# Patient Record
Sex: Female | Born: 1990 | Race: Black or African American | Hispanic: No | Marital: Single | Smoking: Never smoker
Health system: Southern US, Community
[De-identification: ages and names within clinical notes are randomized; demographics above are authoritative.]

## PROBLEM LIST (undated history)

## (undated) ENCOUNTER — Ambulatory Visit (HOSPITAL_COMMUNITY): Payer: MEDICAID | Source: Home / Self Care

## (undated) DIAGNOSIS — G43909 Migraine, unspecified, not intractable, without status migrainosus: Secondary | ICD-10-CM

## (undated) DIAGNOSIS — A6000 Herpesviral infection of urogenital system, unspecified: Secondary | ICD-10-CM

## (undated) DIAGNOSIS — B379 Candidiasis, unspecified: Secondary | ICD-10-CM

## (undated) DIAGNOSIS — A599 Trichomoniasis, unspecified: Secondary | ICD-10-CM

---

## 2013-01-20 ENCOUNTER — Encounter (HOSPITAL_COMMUNITY): Payer: Self-pay | Admitting: *Deleted

## 2013-01-20 ENCOUNTER — Emergency Department (HOSPITAL_COMMUNITY)
Admission: EM | Admit: 2013-01-20 | Discharge: 2013-01-20 | Disposition: A | Discharge status: Home / Self Care | Payer: Medicaid - Out of State | Attending: Emergency Medicine | Admitting: Emergency Medicine

## 2013-01-20 DIAGNOSIS — Z3202 Encounter for pregnancy test, result negative: Secondary | ICD-10-CM | POA: Insufficient documentation

## 2013-01-20 DIAGNOSIS — N390 Urinary tract infection, site not specified: Secondary | ICD-10-CM

## 2013-01-20 LAB — URINALYSIS, ROUTINE W REFLEX MICROSCOPIC
Glucose, UA: NEGATIVE mg/dL
Protein, ur: NEGATIVE mg/dL
Specific Gravity, Urine: 1.034 — ABNORMAL HIGH (ref 1.005–1.030)
pH: 6.5 (ref 5.0–8.0)

## 2013-01-20 LAB — URINE MICROSCOPIC-ADD ON

## 2013-01-20 LAB — POCT PREGNANCY, URINE: Preg Test, Ur: NEGATIVE

## 2013-01-20 MED ORDER — SULFAMETHOXAZOLE-TRIMETHOPRIM 800-160 MG PO TABS: 1.0000 | ORAL_TABLET | Freq: Two times a day (BID) | ORAL | Status: DC

## 2013-01-20 NOTE — ED Provider Notes (Signed)
History     CSN: [id number]  Arrival date & time 01/20/13  0720   First MD Initiated Contact with Patient 01/20/13 [phone]      Chief Complaint  Patient presents with  . Dysuria    (Consider location/radiation/quality/duration/timing/severity/associated sxs/prior treatment) HPI  22 year old female presents complaining of dysuria. Patient reports for the past 3 days she has had pain or pressure upon urination, urinary frequency and urgency and also notice hematuria on the first onset of the symptoms. Symptom has been gradual, persistent, worsening. No specific treatment tried. No complaints of fever, chills, nausea, vomiting, diarrhea, vaginal discharge, or rash. Denies abdominal pain or back pain. Denies pain with sexual activity. Last menstrual period was 2 weeks ago. Patient is sexually active and not using protection. No prior history of UTI.  No past medical history on file.  No past surgical history on file.  No family history on file.  History  Substance Use Topics  . Smoking status: Not on file  . Smokeless tobacco: Not on file  . Alcohol Use: Not on file    OB History   No data available      Review of Systems  Constitutional:       10 Systems reviewed and all are negative for acute change except as noted in the HPI.     Allergies  Review of patient's allergies indicates not on file.  Home Medications  No current outpatient prescriptions on file.  BP 118/62  Pulse 84  Temp(Src) 98.8 F (37.1 C) (Oral)  Resp 16  SpO2 98%  Physical Exam  Nursing note and vitals reviewed. Constitutional: She appears well-developed and well-nourished. No distress.  Awake, alert, nontoxic appearance  HENT:  Head: Atraumatic.  Eyes: Conjunctivae are normal. Right eye exhibits no discharge. Left eye exhibits no discharge.  Neck: Neck supple.  Cardiovascular: Normal rate and regular rhythm.   Pulmonary/Chest: Effort normal. No respiratory distress. She exhibits no  tenderness.  Abdominal: Soft. There is no tenderness. There is no rebound.  Genitourinary:  No CVA tenderness  Musculoskeletal: She exhibits no tenderness.  Neurological: She is alert.  Skin: No rash noted.  Psychiatric: She has a normal mood and affect.    ED Course  Procedures (including critical care time)  7:43 AM Patient with dysuria suggestive of urinary tract infection. UA and pregnancy test obtained. No abdominal pain back pain on exam. Is afebrile with stable normal vital sign.  8:36 AM UA shows some evidence of UTI, since pt exhibits sxs suggest of UTI, will treat with bactrim.  Urine culture sent.    Labs Reviewed  URINALYSIS, ROUTINE W REFLEX MICROSCOPIC - Abnormal; Notable for the following:    APPearance HAZY (*)    Specific Gravity, Urine 1.034 (*)    Leukocytes, UA SMALL (*)    All other components within normal limits  URINE MICROSCOPIC-ADD ON - Abnormal; Notable for the following:    Bacteria, UA FEW (*)    All other components within normal limits  POCT PREGNANCY, URINE   No results found.   1. UTI (lower urinary tract infection)       MDM  BP 118/62  Pulse 84  Temp(Src) 98.8 F (37.1 C) (Oral)  Resp 16  SpO2 98%         Fayrene Helper, PA-C 01/20/13 [phone]

## 2013-01-20 NOTE — ED Notes (Signed)
Pt in c/o painful urination post voiding onset x 3 days, pt reports hematuria on first onset of symptoms, pt denies vaginal discharge, painful intercourse, urinary frequency, pt A&O x 4, follows commands, speaks in complete sentences

## 2013-01-21 LAB — URINE CULTURE: Colony Count: >=100000

## 2013-01-21 NOTE — ED Provider Notes (Signed)
Medical screening examination/treatment/procedure(s) were performed by non-physician practitioner and as supervising physician I was immediately available for consultation/collaboration.   Carleene Cooper III, MD 01/21/13 [phone]

## 2013-01-22 NOTE — Progress Notes (Signed)
ED Antimicrobial Stewardship Positive Culture Follow Up   Jeanne Chaney is an 22 y.o. female who presented to [facility] on 01/20/2013 with a chief complaint of painful urination, hematuria, painful intercourse, and urinary frequency  Chief Complaint  Patient presents with  . Dysuria    Recent Results (from the past 720 hour(s))  URINE CULTURE     Status: None   Collection Time    01/20/13  7:35 AM      Result Value Range Status   Specimen Description URINE, CLEAN CATCH   Final   Special Requests NONE   Final   Culture  Setup Time 01/20/2013 06:33   Final   Colony Count >=100,000 COLONIES/ML   Final   Culture     Final   Value: STAPHYLOCOCCUS SPECIES (COAGULASE NEGATIVE)     Note: RIFAMPIN AND GENTAMICIN SHOULD NOT BE USED AS SINGLE DRUGS FOR TREATMENT OF STAPH INFECTIONS.   Report Status 01/21/2013 FINAL   Final   Organism ID, Bacteria STAPHYLOCOCCUS SPECIES (COAGULASE NEGATIVE)   Final    [x]  Treated with Bactrim, organism not tested against prescribed antimicrobial []  Patient discharged originally without antimicrobial agent and treatment is now indicated  There is a chance that Bactrim will cover the patient's UTI -- however this was not tested for. Would recommend to call the patient: * If feeling better -- no changes needed, can complete treatment course with Bactrim * If still symptomatic -- would recommend changing to Macrobid 100 mg twice daily for 7 days  ED Provider: Wynetta Emery, PA-C  Rolley Sims 01/22/2013, 10:50 AM Infectious Diseases Pharmacist Phone# [phone]

## 2013-01-22 NOTE — ED Notes (Signed)
Post ED Visit - Positive Culture Follow-up: Successful Patient Follow-Up  Culture assessed and recommendations reviewed by: []  Wes Dulaney, Pharm.D., BCPS []  Celedonio Miyamoto, Pharm.D., BCPS [x]  Georgina Pillion, Pharm.D., BCPS []  [place], [address].D., BCPS, AAHIVP []  Estella Husk, Pharm.D., BCPS, AAHIVP  Positive urine culture  []  Patient discharged without antimicrobial prescription and treatment is now indicated [x]  Organism is resistant to prescribed ED discharge antimicrobial []  Patient with positive blood cultures  Changes discussed with ED provider:Nicole Piscotta New antibiotic prescription recommend change to  Macrobid 100 mg BID x 7 days needs to be called to pharmacy.  Contacted patient-Voice mail message for patient to return call , date 01/22/2013 time 1554   Larena Sox 01/22/2013, 3:50 PM

## 2013-01-28 ENCOUNTER — Emergency Department (HOSPITAL_COMMUNITY)
Admission: EM | Admit: 2013-01-28 | Discharge: 2013-01-28 | Disposition: A | Discharge status: Home / Self Care | Payer: Medicaid - Out of State | Attending: Emergency Medicine | Admitting: Emergency Medicine

## 2013-01-28 ENCOUNTER — Encounter (HOSPITAL_COMMUNITY): Payer: Self-pay | Admitting: *Deleted

## 2013-01-28 DIAGNOSIS — Z3202 Encounter for pregnancy test, result negative: Secondary | ICD-10-CM | POA: Insufficient documentation

## 2013-01-28 DIAGNOSIS — B373 Candidiasis of vulva and vagina: Secondary | ICD-10-CM | POA: Insufficient documentation

## 2013-01-28 DIAGNOSIS — N949 Unspecified condition associated with female genital organs and menstrual cycle: Secondary | ICD-10-CM | POA: Insufficient documentation

## 2013-01-28 DIAGNOSIS — B3731 Acute candidiasis of vulva and vagina: Secondary | ICD-10-CM | POA: Insufficient documentation

## 2013-01-28 LAB — WET PREP, GENITAL

## 2013-01-28 LAB — POCT PREGNANCY, URINE: Preg Test, Ur: NEGATIVE

## 2013-01-28 MED ORDER — FLUCONAZOLE 150 MG PO TABS: ORAL_TABLET | ORAL | Status: DC

## 2013-01-28 MED ORDER — CLOTRIMAZOLE 1 % EX CREA: TOPICAL_CREAM | CUTANEOUS | Status: DC

## 2013-01-28 MED ORDER — FLUCONAZOLE 100 MG PO TABS
150.0000 mg | ORAL_TABLET | Freq: Once | ORAL | Status: AC
  Administered 2013-01-28: 150 mg via ORAL
  Filled 2013-01-28: qty 2

## 2013-01-28 NOTE — ED Provider Notes (Signed)
History     CSN: [id number]  Arrival date & time 01/28/13  4098   First MD Initiated Contact with Patient 01/28/13 0703      Chief Complaint  Patient presents with  . Vaginal Itching    (Consider location/radiation/quality/duration/timing/severity/associated sxs/prior treatment) HPI Jeanne Chaney is a 22 y.o. female who presents to ED with complaint of vaginal itching. States itching started yesterday. States just finished a course of bactrim for UTI. States no discharge, no bleeding. No urinary symptoms. Denies fever, chills. Denies beign pregnant. Did not try any medications.   History reviewed. No pertinent past medical history.  History reviewed. No pertinent past surgical history.  No family history on file.  History  Substance Use Topics  . Smoking status: Never Smoker   . Smokeless tobacco: Not on file  . Alcohol Use: No    OB History   Grav Para Term Preterm Abortions TAB SAB Ect Mult Living                  Review of Systems  Constitutional: Negative for fever and chills.  Respiratory: Negative.   Cardiovascular: Negative.   Gastrointestinal: Negative for nausea, vomiting and abdominal pain.  Genitourinary: Positive for vaginal pain. Negative for flank pain, vaginal bleeding and vaginal discharge.  Musculoskeletal: Negative.   Skin: Negative.   Neurological: Negative for weakness and numbness.    Allergies  Review of patient's allergies indicates no known allergies.  Home Medications   Current Outpatient Rx  Name  Route  Sig  Dispense  Refill  . nitrofurantoin, macrocrystal-monohydrate, (MACROBID) 100 MG capsule   Oral   Take 100 mg by mouth 2 (two) times daily.         . Etonogestrel (IMPLANON [place])   Subcutaneous   Inject 1 application into the skin once.           BP 126/75  Pulse 93  Temp(Src) 98.4 F (36.9 C) (Oral)  Resp 16  SpO2 98%  LMP 01/07/2013  Physical Exam  Vitals reviewed. Constitutional: She is oriented to person,  place, and time. She appears well-developed and well-nourished. No distress.  Eyes: Conjunctivae are normal.  Neck: Neck supple.  Cardiovascular: Normal rate, regular rhythm and normal heart sounds.   Pulmonary/Chest: Effort normal and breath sounds normal. No respiratory distress. She has no wheezes. She has no rales.  Abdominal: Soft. Bowel sounds are normal. She exhibits no distension. There is no tenderness. There is no rebound.  Genitourinary: Uterus normal. Cervix exhibits no motion tenderness and no friability. Right adnexum displays no mass and no tenderness. Left adnexum displays no mass and no tenderness. Vaginal discharge found.  Thick white discharge present  Musculoskeletal: Normal range of motion.  Neurological: She is alert and oriented to person, place, and time.  Skin: Skin is warm and dry.    ED Course  Procedures (including critical care time)  Results for orders placed during the hospital encounter of 01/28/13  WET PREP, GENITAL      Result Value Range   Yeast Wet Prep HPF POC FEW (*) NONE SEEN   Trich, Wet Prep NONE SEEN  NONE SEEN   Clue Cells Wet Prep HPF POC NONE SEEN  NONE SEEN   WBC, Wet Prep HPF POC TOO NUMEROUS TO COUNT (*) NONE SEEN  POCT PREGNANCY, URINE      Result Value Range   Preg Test, Ur NEGATIVE  NEGATIVE   No results found.    1. Candida vaginitis  MDM  Pt with vaginal itching. No pain. No abdominal pain. On exam, no CMT, no uterine or adnexal tenderness. Wet prep showed yeast and tntc WBCs. Discussed results with pt. Pt chose not to be treated for possible STI, given she has no pain and no symptoms. Cultures sent. Pt treated with diflucan 150mg  in ED for yeast infection. Pt will be d/c home with close follow up.   Filed Vitals:   01/28/13 0654  BP: 126/75  Pulse: 93  Temp: 98.4 F (36.9 C)  Resp: [id number] A Denzell Colasanti, PA-C 01/28/13 [phone]

## 2013-01-28 NOTE — ED Notes (Signed)
Pt with vaginal itching and no vaginal discharge

## 2013-01-28 NOTE — ED Provider Notes (Signed)
  Medical screening examination/treatment/procedure(s) were performed by non-physician practitioner and as supervising physician I was immediately available for consultation/collaboration.    Nobel Brar, MD 01/28/13 1616 

## 2013-01-28 NOTE — ED Notes (Signed)
Pelvic cart at bedside. 

## 2013-01-28 NOTE — ED Notes (Signed)
Pt was seen here last weekend and dx with uti, placed on anbx and now having vaginal itching but no discharge.  LMP- 01/07/2013

## 2013-01-29 LAB — GC/CHLAMYDIA PROBE AMP: GC Probe RNA: NEGATIVE

## 2013-05-04 ENCOUNTER — Emergency Department (HOSPITAL_COMMUNITY)
Admission: EM | Admit: 2013-05-04 | Discharge: 2013-05-04 | Disposition: A | Discharge status: Home / Self Care | Payer: Medicaid Other | Attending: Emergency Medicine | Admitting: Emergency Medicine

## 2013-05-04 ENCOUNTER — Encounter (HOSPITAL_COMMUNITY): Payer: Self-pay | Admitting: *Deleted

## 2013-05-04 DIAGNOSIS — Z8619 Personal history of other infectious and parasitic diseases: Secondary | ICD-10-CM | POA: Insufficient documentation

## 2013-05-04 DIAGNOSIS — L293 Anogenital pruritus, unspecified: Secondary | ICD-10-CM | POA: Insufficient documentation

## 2013-05-04 DIAGNOSIS — Z3202 Encounter for pregnancy test, result negative: Secondary | ICD-10-CM | POA: Insufficient documentation

## 2013-05-04 DIAGNOSIS — N898 Other specified noninflammatory disorders of vagina: Secondary | ICD-10-CM | POA: Insufficient documentation

## 2013-05-04 HISTORY — DX: Candidiasis, unspecified: B37.9

## 2013-05-04 LAB — URINALYSIS, ROUTINE W REFLEX MICROSCOPIC
Nitrite: NEGATIVE
Specific Gravity, Urine: 1.024 (ref 1.005–1.030)
Urobilinogen, UA: 0.2 mg/dL (ref 0.0–1.0)

## 2013-05-04 LAB — WET PREP, GENITAL
Trich, Wet Prep: NONE SEEN
Yeast Wet Prep HPF POC: NONE SEEN

## 2013-05-04 MED ORDER — FLUCONAZOLE 100 MG PO TABS
150.0000 mg | ORAL_TABLET | Freq: Once | ORAL | Status: AC
  Administered 2013-05-04: 150 mg via ORAL
  Filled 2013-05-04: qty 2

## 2013-05-04 MED ORDER — FLUCONAZOLE 100 MG PO TABS
150.0000 mg | ORAL_TABLET | Freq: Once | ORAL | Status: DC
  Filled 2013-05-04: qty 2

## 2013-05-04 NOTE — ED Provider Notes (Signed)
CSN: [id number]     Arrival date & time 05/04/13  5784 History   First MD Initiated Contact with Patient 05/04/13 [phone]     Chief Complaint  Patient presents with  . Vaginal Itching   (Consider location/radiation/quality/duration/timing/severity/associated sxs/prior Treatment) HPI  22 year old G2 P2 female presents complaining of vaginal itch. Patient reports gradual onset of vaginal discomfort and itch ongoing for about 4 days. Symptom is unrelieved after using over-the-counter vaginal. She did notice some mild vaginal discharge with no odor. Otherwise denies fever, chills, nausea, vomiting, abdominal pain, back pain, dysuria, hematuria, or rash. No prior history of STD. Patient is sexually active with one partner. Currently using Implanon as birth control method.    Past Medical History  Diagnosis Date  . Yeast infection    History reviewed. No pertinent past surgical history. No family history on file. History  Substance Use Topics  . Smoking status: Never Smoker   . Smokeless tobacco: Not on file  . Alcohol Use: No   OB History   Grav Para Term Preterm Abortions TAB SAB Ect Mult Living                 Review of Systems  Constitutional: Negative for fever.  Gastrointestinal: Negative for abdominal pain.  Genitourinary: Positive for vaginal discharge. Negative for dysuria, genital sores and vaginal pain.    Allergies  Review of patient's allergies indicates no known allergies.  Home Medications   Current Outpatient Rx  Name  Route  Sig  Dispense  Refill  . clotrimazole (LOTRIMIN) 1 % cream      Apply to affected area 2 times daily   15 g   0   . Etonogestrel (IMPLANON [place])   Subcutaneous   Inject 1 application into the skin once.         . fluconazole (DIFLUCAN) 150 MG tablet      Take 1 tab PO once   1 tablet   0   . nitrofurantoin, macrocrystal-monohydrate, (MACROBID) 100 MG capsule   Oral   Take 100 mg by mouth 2 (two) times daily.          BP  113/70  Pulse 89  Temp(Src) 98.4 F (36.9 C) (Oral)  Resp 16  SpO2 98%  LMP 03/23/2013 Physical Exam  Nursing note and vitals reviewed. Constitutional: She appears well-developed and well-nourished. No distress.  HENT:  Head: Normocephalic and atraumatic.  Eyes: Conjunctivae are normal.  Neck: Normal range of motion. Neck supple.  Cardiovascular: Normal rate and regular rhythm.   Pulmonary/Chest: Effort normal and breath sounds normal. She exhibits no tenderness.  Abdominal: Soft. There is no tenderness.  Genitourinary: Vagina normal and uterus normal. There is no rash or lesion on the right labia. There is no rash or lesion on the left labia. Cervix exhibits no motion tenderness and no discharge. Right adnexum displays no mass and no tenderness. Left adnexum displays no mass and no tenderness. No erythema, tenderness or bleeding around the vagina. No vaginal discharge found.  Chaperone present  Normal external genitalia.  Curd-like white vaginal discharge in vaginal vault.  No adnexal tenderness, no CMT.  No mass.    Lymphadenopathy:       Right: No inguinal adenopathy present.       Left: No inguinal adenopathy present.    ED Course  Procedures (including critical care time)  7:33 AM Pt presents c/o vaginal itch.  Curd-like vaginal discharge noted on pelvic exam without evidence concerning for PID.  9:11 AM preg neg, UA unremarkable, wet prep shows no evidence of yeast infection or other obvious infection.  Since pt does worries about yeast infection, will preemptively giving one dose of fluconazole 150mg  PO.  Pt did report shaving and using new body wash.  No obvious rash or obvious signs of allergic reaction.  Recommend switching back to previous body wash and avoid shaving to see if sxs will improve.  Resources given.  GC/Ch cultures sent.    Labs Review Labs Reviewed  WET PREP, GENITAL - Abnormal; Notable for the following:    WBC, Wet Prep HPF POC FEW (*)    All other  components within normal limits  GC/CHLAMYDIA PROBE AMP  URINALYSIS, ROUTINE W REFLEX MICROSCOPIC  POCT PREGNANCY, URINE   Imaging Review No results found.  MDM   1. Itching in the vaginal area    BP 113/70  Pulse 89  Temp(Src) 98.4 F (36.9 C) (Oral)  Resp 16  SpO2 98%  LMP 03/23/2013  I have reviewed nursing notes and vital signs.   I reviewed available ER/hospitalization records thought the EMR     Fayrene Helper, [place] 05/04/13 1610

## 2013-05-04 NOTE — ED Notes (Addendum)
Vaginal itching. Jeanne Chaney, thickish d/c. No sex since Tuesday.

## 2013-05-04 NOTE — ED Provider Notes (Signed)
Medical screening examination/treatment/procedure(s) were performed by non-physician practitioner and as supervising physician I was immediately available for consultation/collaboration.    Celene Kras, MD 05/04/13 [phone]

## 2013-05-04 NOTE — ED Notes (Signed)
PA at bedside to perform pelvic exam. Pt tolerated without difficulty. Cultures sent to lab.

## 2013-05-04 NOTE — ED Notes (Signed)
Pt discharged home. Has no further questions at the time.  

## 2013-05-20 ENCOUNTER — Emergency Department (HOSPITAL_COMMUNITY)
Admission: EM | Admit: 2013-05-20 | Discharge: 2013-05-20 | Disposition: A | Discharge status: Home / Self Care | Payer: Medicaid Other | Attending: Emergency Medicine | Admitting: Emergency Medicine

## 2013-05-20 ENCOUNTER — Encounter (HOSPITAL_COMMUNITY): Payer: Self-pay

## 2013-05-20 DIAGNOSIS — Z3202 Encounter for pregnancy test, result negative: Secondary | ICD-10-CM | POA: Insufficient documentation

## 2013-05-20 DIAGNOSIS — N390 Urinary tract infection, site not specified: Secondary | ICD-10-CM | POA: Insufficient documentation

## 2013-05-20 DIAGNOSIS — Z8619 Personal history of other infectious and parasitic diseases: Secondary | ICD-10-CM | POA: Insufficient documentation

## 2013-05-20 LAB — URINALYSIS, ROUTINE W REFLEX MICROSCOPIC
Glucose, UA: NEGATIVE mg/dL
Specific Gravity, Urine: 1.03 (ref 1.005–1.030)
pH: 6 (ref 5.0–8.0)

## 2013-05-20 LAB — URINE MICROSCOPIC-ADD ON

## 2013-05-20 LAB — POCT PREGNANCY, URINE: Preg Test, Ur: NEGATIVE

## 2013-05-20 MED ORDER — PHENAZOPYRIDINE HCL 200 MG PO TABS: 200.0000 mg | ORAL_TABLET | Freq: Three times a day (TID) | ORAL | Status: DC

## 2013-05-20 MED ORDER — NITROFURANTOIN MONOHYD MACRO 100 MG PO CAPS: 100.0000 mg | ORAL_CAPSULE | Freq: Two times a day (BID) | ORAL | Status: DC

## 2013-05-20 NOTE — ED Provider Notes (Signed)
  I performed a history and physical examination of Jeanne Chaney and discussed her management with Dr. Littie Deeds.  I agree with the history, physical, assessment, and plan of care, with the following exceptions: None  I was present for the following procedures: None Time Spent in Critical Care of the patient: None Time spent in discussions with the patient and family: 5  22 y.o. Female with urinary frequency, dysuria, no other complaints c.with. UtiHolli Humbles, MD 05/20/13 [phone]

## 2013-05-20 NOTE — ED Notes (Signed)
Pt reports dysuria, urinary frequency and urgency for 3-4 days,

## 2013-05-20 NOTE — ED Notes (Signed)
Dr. Ray at the bedside.  

## 2013-05-20 NOTE — ED Provider Notes (Signed)
CSN: [id number]     Arrival date & time 05/20/13  1314 History   First MD Initiated Contact with Patient 05/20/13 1554     Chief Complaint  Patient presents with  . Urinary Tract Infection   (Consider location/radiation/quality/duration/timing/severity/associated sxs/prior Treatment) Patient is a 22 y.o. female presenting with frequency.  Urinary Frequency This is a new problem. Episode onset: 2 days ago. The problem occurs constantly. The problem has been unchanged. Associated symptoms include abdominal pain (suprapubic). Pertinent negatives include no chest pain, chills, congestion, coughing, fever, nausea, numbness, rash, sore throat or vomiting. Nothing aggravates the symptoms. She has tried nothing for the symptoms.    Past Medical History  Diagnosis Date  . Yeast infection    No past surgical history on file. No family history on file. History  Substance Use Topics  . Smoking status: Never Smoker   . Smokeless tobacco: Not on file  . Alcohol Use: No   OB History   Grav Para Term Preterm Abortions TAB SAB Ect Mult Living                 Review of Systems  Constitutional: Negative for fever and chills.  HENT: Negative for congestion, sore throat and rhinorrhea.   Eyes: Negative for photophobia and visual disturbance.  Respiratory: Negative for cough and shortness of breath.   Cardiovascular: Negative for chest pain and leg swelling.  Gastrointestinal: Positive for abdominal pain (suprapubic). Negative for nausea, vomiting, diarrhea and constipation.  Endocrine: Negative for polyphagia and polyuria.  Genitourinary: Positive for frequency. Negative for dysuria, flank pain, vaginal bleeding, vaginal discharge and enuresis.  Musculoskeletal: Negative for back pain and gait problem.  Skin: Negative for color change and rash.  Neurological: Negative for dizziness, syncope, light-headedness and numbness.  Hematological: Negative for adenopathy. Does not bruise/bleed easily.   All other systems reviewed and are negative.    Allergies  Review of patient's allergies indicates no known allergies.  Home Medications   Current Outpatient Rx  Name  Route  Sig  Dispense  Refill  . Etonogestrel (IMPLANON [place])   Subcutaneous   Inject 1 application into the skin once.          BP 124/74  Pulse 77  Temp(Src) 98.6 F (37 C) (Oral)  Resp 18  Ht 5\' 6"  (1.676 m)  Wt 194 lb (87.998 kg)  BMI 31.33 kg/m2  SpO2 100%  LMP 03/23/2013 Physical Exam  Vitals reviewed. Constitutional: She is oriented to person, place, and time. She appears well-developed and well-nourished.  HENT:  Head: Normocephalic and atraumatic.  Right Ear: External ear normal.  Left Ear: External ear normal.  Eyes: Conjunctivae and EOM are normal. Pupils are equal, round, and reactive to light.  Neck: Normal range of motion. Neck supple.  Cardiovascular: Normal rate, regular rhythm, normal heart sounds and intact distal pulses.   Pulmonary/Chest: Effort normal and breath sounds normal.  Abdominal: Soft. Bowel sounds are normal. There is tenderness in the suprapubic area.  Musculoskeletal: Normal range of motion.  Neurological: She is alert and oriented to person, place, and time.  Skin: Skin is warm and dry.    ED Course  Procedures (including critical care time) Labs Review Labs Reviewed  URINALYSIS, ROUTINE W REFLEX MICROSCOPIC - Abnormal; Notable for the following:    APPearance TURBID (*)    Hgb urine dipstick MODERATE (*)    Ketones, ur 15 (*)    Protein, ur 100 (*)    Leukocytes, UA LARGE (*)  All other components within normal limits  URINE MICROSCOPIC-ADD ON - Abnormal; Notable for the following:    Squamous Epithelial / LPF MANY (*)    All other components within normal limits  URINE CULTURE  URINE CULTURE  POCT PREGNANCY, URINE   Imaging Review No results found.  MDM  No diagnosis found. 22 y.o. female  with pertinent PMH of prior uti presents with dysuria and  urinary frequency x2 days.  Symptoms similar to prior UTI.  No concerning sexual history, vaginal discharge or bleeding.  No systemic symptoms, CVA tenderness, or concerning features for pyelonephritis.  UA with UTI.  Upreg negative.  Stable to dc home with PCP fu.  Given standard return precautions.    Labs and imaging as above reviewed by myself and attending,Dr. Rosalia Hammers, with whom case was discussed.   1. Urinary tract infection         Noel Gerold, MD 05/20/13 1652

## 2013-05-22 LAB — URINE CULTURE: Colony Count: 70000

## 2013-05-23 NOTE — ED Notes (Signed)
+   Urine Patient treated with Nitrofurantoin-sensitive to same-chart appended per protocol MD.  

## 2013-07-19 ENCOUNTER — Encounter (HOSPITAL_COMMUNITY): Payer: Self-pay | Admitting: Emergency Medicine

## 2013-07-19 ENCOUNTER — Emergency Department (HOSPITAL_COMMUNITY)
Admission: EM | Admit: 2013-07-19 | Discharge: 2013-07-19 | Disposition: A | Discharge status: Home / Self Care | Payer: Medicaid - Out of State | Attending: Emergency Medicine | Admitting: Emergency Medicine

## 2013-07-19 DIAGNOSIS — I10 Essential (primary) hypertension: Secondary | ICD-10-CM | POA: Insufficient documentation

## 2013-07-19 DIAGNOSIS — L509 Urticaria, unspecified: Secondary | ICD-10-CM | POA: Insufficient documentation

## 2013-07-19 DIAGNOSIS — R05 Cough: Secondary | ICD-10-CM | POA: Insufficient documentation

## 2013-07-19 DIAGNOSIS — R51 Headache: Secondary | ICD-10-CM | POA: Diagnosis not present

## 2013-07-19 DIAGNOSIS — M129 Arthropathy, unspecified: Secondary | ICD-10-CM | POA: Insufficient documentation

## 2013-07-19 DIAGNOSIS — T7840XA Allergy, unspecified, initial encounter: Secondary | ICD-10-CM

## 2013-07-19 DIAGNOSIS — J3489 Other specified disorders of nose and nasal sinuses: Secondary | ICD-10-CM | POA: Insufficient documentation

## 2013-07-19 DIAGNOSIS — R21 Rash and other nonspecific skin eruption: Secondary | ICD-10-CM | POA: Diagnosis present

## 2013-07-19 DIAGNOSIS — Z8619 Personal history of other infectious and parasitic diseases: Secondary | ICD-10-CM | POA: Diagnosis not present

## 2013-07-19 DIAGNOSIS — R059 Cough, unspecified: Secondary | ICD-10-CM | POA: Insufficient documentation

## 2013-07-19 MED ORDER — PREDNISONE 20 MG PO TABS: ORAL_TABLET | ORAL | Status: DC

## 2013-07-19 MED ORDER — DIPHENHYDRAMINE HCL 12.5 MG/5ML PO SYRP: 12.5000 mg | ORAL_SOLUTION | Freq: Four times a day (QID) | ORAL | Status: DC | PRN

## 2013-07-19 MED ORDER — DIPHENHYDRAMINE HCL 25 MG PO TABS: 25.0000 mg | ORAL_TABLET | Freq: Four times a day (QID) | ORAL | Status: DC

## 2013-07-19 MED ORDER — DIPHENHYDRAMINE HCL 25 MG PO CAPS
25.0000 mg | ORAL_CAPSULE | Freq: Once | ORAL | Status: AC
  Administered 2013-07-19: 25 mg via ORAL
  Filled 2013-07-19: qty 1

## 2013-07-19 NOTE — ED Provider Notes (Signed)
CSN: [id number]     Arrival date & time 07/19/13  9528 History   First MD Initiated Contact with Patient 07/19/13 [phone]     Chief Complaint  Patient presents with  . Rash   (Consider location/radiation/quality/duration/timing/severity/associated sxs/prior Treatment) The history is provided by the patient. No language interpreter was used.  Jeanne Chaney is a 22 year old female presenting to emergency department with rash that started this morning. Patient reports that she took an over-the-counter medication for sore throat on Thursday evening - reported that when she took the medication her tongue felt numb, woke up on Friday morning with a rash on her right arm-reported that it looked as if she got bit by bugs. Patient reported that when she woke up this morning she had the rash localized to her left forearm, reported same presentation. Patient reports that while waiting in the emergency department she noticed that the rash broke her on her right arm, same presentation. Patient reports that the rash is extremely pruritic. Denied fever, chills, neck pain, neck stiffness, throat closing sensation, tongue swelling, numbness, tingling, denied changes to fabric softeners/detergents/sprays/soaps/lotions. Patient is unable to recall the name of the medication that she used. PCP none  Past Medical History  Diagnosis Date  . Yeast infection   . Arthritis    History reviewed. No pertinent past surgical history. No family history on file. History  Substance Use Topics  . Smoking status: Never Smoker   . Smokeless tobacco: Not on file  . Alcohol Use: No   OB History   Grav Para Term Preterm Abortions TAB SAB Ect Mult Living                 Review of Systems  Constitutional: Negative for fever and chills.  HENT: Positive for congestion. Negative for drooling and facial swelling.   Respiratory: Positive for cough. Negative for chest tightness and shortness of breath.   Cardiovascular: Negative  for chest pain.  Gastrointestinal: Negative for nausea, vomiting and abdominal pain.  Musculoskeletal: Negative for back pain, neck pain and neck stiffness.  Skin: Positive for rash.  Neurological: Positive for headaches. Negative for dizziness and weakness.  All other systems reviewed and are negative.    Allergies  Review of patient's allergies indicates no known allergies.  Home Medications   Current Outpatient Rx  Name  Route  Sig  Dispense  Refill  . OVER THE COUNTER MEDICATION   Nasal   Place 1-2 sprays into the nose every 12 (twelve) hours as needed (for nasal congestion).         . diphenhydrAMINE (BENADRYL) 25 MG tablet   Oral   Take 1 tablet (25 mg total) by mouth every 6 (six) hours.   20 tablet   0   . Etonogestrel (IMPLANON [place])   Subcutaneous   Inject 1 application into the skin once.         . predniSONE (DELTASONE) 20 MG tablet      3 tabs po day one, then 2 tabs daily x 4 days   11 tablet   0    BP 108/64  Pulse 80  Temp(Src) 98.9 F (37.2 C) (Oral)  Resp 18  SpO2 100% Physical Exam  Nursing note and vitals reviewed. Constitutional: She is oriented to person, place, and time. She appears well-developed and well-nourished. No distress.  HENT:  Head: Normocephalic and atraumatic.  Right Ear: External ear normal.  Mouth/Throat: Oropharynx is clear and moist. No oropharyngeal exudate.  Negative facial swelling  Negative angioedema identified Negative tongue swelling Negative oral lesions identified Negative swelling, erythema, inflammation noted to the posterior oropharynx her tonsils Negative for drooling Cerumen noted to left ear Positive nasal congestion noted  Eyes: Conjunctivae and EOM are normal. Pupils are equal, round, and reactive to light.  Neck: Normal range of motion. Neck supple. No tracheal deviation present.  Negative nuchal rigidity Negative lymphadenopathy  Cardiovascular: Normal rate, regular rhythm and normal heart  sounds.  Exam reveals no friction rub.   No murmur heard. Pulses:      Radial pulses are 2+ on the right side, and 2+ on the left side.  Pulmonary/Chest: Effort normal and breath sounds normal. No respiratory distress. She has no wheezes. She has no rales.  Negative respiratory distress Negative use of accessory muscles Patient is able to speak in full sentences without difficulty  Musculoskeletal: Normal range of motion.  Full range of motion to upper and lower extremities bilaterally without difficulty  Lymphadenopathy:    She has no cervical adenopathy.  Neurological: She is alert and oriented to person, place, and time. She exhibits normal muscle tone. Coordination normal.  Skin: Skin is warm and dry. Rash noted. She is not diaphoretic. No erythema.  Urticarial rash noted to the right upper arm, circumferential.  Psychiatric: She has a normal mood and affect. Her behavior is normal. Thought content normal.    ED Course  Procedures (including critical care time)  8:58 PM This provider re-assessed patient. Patient responded well to the benadryl - urticarial rash improved and no longer present on the right arm. Patient feeling well. Patient able to tolerate fluids PO without difficulty. Denied chest pain, shortness of breath, difficulty breathing, tongue swelling, numbness, tingling, difficulty swallowing, throat closing sensation. Discussed plan for discharge-patient agreed to plan of care, all questions answered.  Labs Review Labs Reviewed - No data to display Imaging Review No results found.  EKG Interpretation   None       MDM   1. Urticarial rash   2. Allergic reaction, initial encounter    Medications  diphenhydrAMINE (BENADRYL) capsule 25 mg (25 mg Oral Given 07/19/13 0733)   Filed Vitals:   07/19/13 1610 07/19/13 0733  BP: 118/72 108/64  Pulse: 83 80  Temp: 98.9 F (37.2 C)   TempSrc: Oral   Resp: 20 18  SpO2: 98% 100%   Patient presenting to emergency  department with urticarial rash that has been ongoing since Friday morning after patient took over-the-counter medication-patient is unable to recall what medication she took. Alert and oriented. GCS 15. Heart rate and rhythm normal. Pulses palpable and strong, radial 2+ bilaterally. Lungs clear to auscultation bilaterally. Negative facial swelling, negative angioedema identified. Negative tongue swelling. Patient able to speak in full sentences without difficulty, negative gasping for air, negative respiratory distress, negative use of accessory muscles. Urticarial rash localized to the right upper arm, circumferential - blanching noted - patient actively itching site.  Patient given Benadryl in ED setting. Patient responded well to the Benadryl-urticarial rash disappeared from the right arm. Patient able to tolerate fluids by mouth without difficulty. Negative findings for anaphylactic reaction. Suspicion to be allergic reaction secondary to over-the-counter medication. Patient stable, afebrile. Patient nontoxic appearing. Discharged patient. Discharged patient with Benadryl and prednisone prescription. Referred patient to allergist-recommended that patient receive allergy test to identify exactly what she is allergic to. Discussed with patient to closely monitor what she puts into and onto her body. Discussed with patient to closely monitor symptoms-educated patient  on allergic reaction/anaphylactic reaction symptoms-discussed with patient that if symptoms are to worsen or change to report back to emergency department - strict return structures given. Patient agreed to plan of care, understood, all questions answered.  Raymon Mutton, PA-C 07/20/13 1326

## 2013-07-19 NOTE — ED Notes (Signed)
Patient states she broke out in rash on right anterior forearm after taking a URI medication last night.  That has resolved but now she has the same type of rash on left anterior forearm.

## 2013-07-21 NOTE — ED Provider Notes (Signed)
Medical screening examination/treatment/procedure(s) were performed by non-physician practitioner and as supervising physician I was immediately available for consultation/collaboration.    Sunnie Nielsen, MD 07/21/13 [phone]

## 2014-03-15 ENCOUNTER — Encounter (HOSPITAL_COMMUNITY): Payer: Self-pay | Admitting: Emergency Medicine

## 2014-03-15 ENCOUNTER — Emergency Department (HOSPITAL_COMMUNITY)
Admission: EM | Admit: 2014-03-15 | Discharge: 2014-03-15 | Disposition: A | Discharge status: Home / Self Care | Payer: Medicaid Other | Attending: Emergency Medicine | Admitting: Emergency Medicine

## 2014-03-15 DIAGNOSIS — W4909XA Other specified item causing external constriction, initial encounter: Secondary | ICD-10-CM | POA: Insufficient documentation

## 2014-03-15 DIAGNOSIS — Z8619 Personal history of other infectious and parasitic diseases: Secondary | ICD-10-CM | POA: Diagnosis not present

## 2014-03-15 DIAGNOSIS — Y9389 Activity, other specified: Secondary | ICD-10-CM | POA: Insufficient documentation

## 2014-03-15 DIAGNOSIS — S60449A External constriction of unspecified finger, initial encounter: Secondary | ICD-10-CM

## 2014-03-15 DIAGNOSIS — S60459A Superficial foreign body of unspecified finger, initial encounter: Secondary | ICD-10-CM | POA: Insufficient documentation

## 2014-03-15 DIAGNOSIS — Y9289 Other specified places as the place of occurrence of the external cause: Secondary | ICD-10-CM | POA: Insufficient documentation

## 2014-03-15 DIAGNOSIS — W4904XA Ring or other jewelry causing external constriction, initial encounter: Secondary | ICD-10-CM

## 2014-03-15 NOTE — ED Provider Notes (Signed)
CSN: [id number]     Arrival date & time 03/15/14  0949 History  This chart was scribed for non-physician practitioner Junious SilkHannah Trenell Concannon, PA-C, working with Doug SouSam Jacubowitz, MD by Leone PayorSonum Patel, ED Scribe. This patient was seen in room TR08C/TR08C and the patient's care was started at 10:17 AM.      Chief Complaint  Patient presents with  . Finger Injury    The history is provided by the patient. No language interpreter was used.    HPI Comments: Jeanne Chaney is a 23 y.o. female who presents to the Emergency Department complaining of a ring stuck on her left ring finger for the past 2 months. Patient states she initially noticed it then but states she did not attempt to remove it until yesterday. Patient states she attempted to apply butter and oil without relief. She also soaked her finger in warm saltwater which caused increased swelling. She has associated pain to the affected area and states it felt like a burning sensation while washing dishes. She denies any other symptoms or complaints.   Past Medical History  Diagnosis Date  . Yeast infection    History reviewed. No pertinent past surgical history. History reviewed. No pertinent family history. History  Substance Use Topics  . Smoking status: Never Smoker   . Smokeless tobacco: Not on file  . Alcohol Use: No   OB History   Grav Para Term Preterm Abortions TAB SAB Ect Mult Living                 Review of Systems  Musculoskeletal: Positive for arthralgias.       +finger pain and swelling  All other systems reviewed and are negative.     Allergies  Review of patient's allergies indicates no known allergies.  Home Medications   Prior to Admission medications   Medication Sig Start Date End Date Taking? Authorizing Provider  Etonogestrel (IMPLANON [place]) Inject 1 application into the skin once.   Yes Historical Provider, MD   BP 118/76  Pulse 106  Temp(Src) 98.5 F (36.9 C) (Oral)  Resp 18  SpO2 97%  LMP  02/21/2014 Physical Exam  Nursing note and vitals reviewed. Constitutional: She is oriented to person, place, and time. She appears well-developed and well-nourished. No distress.  HENT:  Head: Normocephalic and atraumatic.  Right Ear: External ear normal.  Left Ear: External ear normal.  Nose: Nose normal.  Mouth/Throat: Oropharynx is clear and moist.  Eyes: Conjunctivae are normal.  Neck: Normal range of motion.  Cardiovascular: Normal rate, regular rhythm, normal heart sounds, intact distal pulses and normal pulses.   Pulses:      Radial pulses are 2+ on the right side, and 2+ on the left side.  Capillary refill < 3 seconds in left 4th finger  Pulmonary/Chest: Effort normal and breath sounds normal. No stridor. No respiratory distress. She has no wheezes. She has no rales.  Abdominal: Soft. She exhibits no distension.  Musculoskeletal: Normal range of motion.  Ring to proximal left 4th finger with distal swelling. Ring can move and rotate but will not advance distally.   Neurological: She is alert and oriented to person, place, and time. She has normal strength.  Sensation intact throughout left fourth finger.   Skin: Skin is warm and dry. She is not diaphoretic. No erythema.  Psychiatric: She has a normal mood and affect. Her behavior is normal.    ED Course  Procedures (including critical care time)  DIAGNOSTIC STUDIES: Oxygen Saturation is  97% on RA, normal by my interpretation.    COORDINATION OF CARE: 10:19 AM Discussed treatment plan with pt at bedside and pt agreed to plan.   Labs Review Labs Reviewed - No data to display  Imaging Review No results found.   EKG Interpretation None      MDM   Final diagnoses:  Constrictive jewelry of finger, initial encounter    Attempted to remove ring from finger. Soaked finger in ice water, applied lubrication, used packing to attempt to wiggle ring off. Unsuccessful. We needed to cut patient's ring off. Nurse performed  this task. Patient remained neurovascularly intact, compartment soft, sensation intact. Patient will take ring to jeweler to be fixed. Vital signs stable for discharge. Patient / Family / Caregiver informed of clinical course, understand medical decision-making process, and agree with plan.   I personally performed the services described in this documentation, which was scribed in my presence. The recorded information has been reviewed and is accurate.    Mora Bellman, PA-C 03/15/14 [phone]

## 2014-03-15 NOTE — ED Notes (Signed)
Pt has ring stuck on left ring finger. Soaked in hot salty water and made it worse. Pulses intact. Denies injury

## 2014-03-15 NOTE — Discharge Instructions (Signed)
Today your ring needed to be cut off. You can take it to a jeweler and have it welded back together. Return to ED for new or worsening symptoms.

## 2014-03-15 NOTE — ED Notes (Signed)
Pt soaking finger in ice water.

## 2014-03-15 NOTE — ED Notes (Signed)
Declined W/C at D/C and was escorted to lobby by RN. 

## 2014-03-15 NOTE — ED Notes (Signed)
PA at bedside.

## 2014-03-19 NOTE — ED Provider Notes (Signed)
Medical screening examination/treatment/procedure(s) were performed by non-physician practitioner and as supervising physician I was immediately available for consultation/collaboration.   EKG Interpretation None       Doug SouSam Ledonna Dormer, MD 03/19/14 1718

## 2014-08-14 ENCOUNTER — Emergency Department (HOSPITAL_COMMUNITY)
Admission: EM | Admit: 2014-08-14 | Discharge: 2014-08-14 | Disposition: A | Discharge status: Home / Self Care | Payer: Medicaid Other | Attending: Emergency Medicine | Admitting: Emergency Medicine

## 2014-08-14 ENCOUNTER — Emergency Department (HOSPITAL_COMMUNITY): Payer: Medicaid Other

## 2014-08-14 ENCOUNTER — Encounter (HOSPITAL_COMMUNITY): Payer: Self-pay | Admitting: *Deleted

## 2014-08-14 DIAGNOSIS — X58XXXA Exposure to other specified factors, initial encounter: Secondary | ICD-10-CM | POA: Insufficient documentation

## 2014-08-14 DIAGNOSIS — S99911A Unspecified injury of right ankle, initial encounter: Secondary | ICD-10-CM | POA: Diagnosis present

## 2014-08-14 DIAGNOSIS — Y9389 Activity, other specified: Secondary | ICD-10-CM | POA: Insufficient documentation

## 2014-08-14 DIAGNOSIS — Z8619 Personal history of other infectious and parasitic diseases: Secondary | ICD-10-CM | POA: Diagnosis not present

## 2014-08-14 DIAGNOSIS — Z793 Long term (current) use of hormonal contraceptives: Secondary | ICD-10-CM | POA: Insufficient documentation

## 2014-08-14 DIAGNOSIS — Y92009 Unspecified place in unspecified non-institutional (private) residence as the place of occurrence of the external cause: Secondary | ICD-10-CM | POA: Diagnosis not present

## 2014-08-14 DIAGNOSIS — S93491A Sprain of other ligament of right ankle, initial encounter: Secondary | ICD-10-CM | POA: Insufficient documentation

## 2014-08-14 DIAGNOSIS — S93401A Sprain of unspecified ligament of right ankle, initial encounter: Secondary | ICD-10-CM

## 2014-08-14 DIAGNOSIS — Y998 Other external cause status: Secondary | ICD-10-CM | POA: Diagnosis not present

## 2014-08-14 MED ORDER — OXYCODONE-ACETAMINOPHEN 5-325 MG PO TABS
1.0000 | ORAL_TABLET | Freq: Once | ORAL | Status: AC
  Administered 2014-08-14: 1 via ORAL
  Filled 2014-08-14: qty 1

## 2014-08-14 MED ORDER — TRAMADOL HCL 50 MG PO TABS: 50.0000 mg | ORAL_TABLET | Freq: Four times a day (QID) | ORAL | Status: DC | PRN

## 2014-08-14 MED ORDER — NAPROXEN 500 MG PO TABS: 500.0000 mg | ORAL_TABLET | Freq: Two times a day (BID) | ORAL | Status: DC

## 2014-08-14 MED ORDER — ONDANSETRON 4 MG PO TBDP
4.0000 mg | ORAL_TABLET | Freq: Once | ORAL | Status: AC
  Administered 2014-08-14: 4 mg via ORAL
  Filled 2014-08-14: qty 1

## 2014-08-14 NOTE — Discharge Instructions (Signed)
Naprosyn for pain. Ultram for severe pain. Keep ankle elevated. Ice several times a day. Follow up with orthopedics specialist if not improving.    Ankle Sprain An ankle sprain is an injury to the strong, fibrous tissues (ligaments) that hold the bones of your ankle joint together.  CAUSES An ankle sprain is usually caused by a fall or by twisting your ankle. Ankle sprains most commonly occur when you step on the outer edge of your foot, and your ankle turns inward. People who participate in sports are more prone to these types of injuries.  SYMPTOMS   Pain in your ankle. The pain may be present at rest or only when you are trying to stand or walk.  Swelling.  Bruising. Bruising may develop immediately or within 1 to 2 days after your injury.  Difficulty standing or walking, particularly when turning corners or changing directions. DIAGNOSIS  Your caregiver will ask you details about your injury and perform a physical exam of your ankle to determine if you have an ankle sprain. During the physical exam, your caregiver will press on and apply pressure to specific areas of your foot and ankle. Your caregiver will try to move your ankle in certain ways. An X-ray exam may be done to be sure a bone was not broken or a ligament did not separate from one of the bones in your ankle (avulsion fracture).  TREATMENT  Certain types of braces can help stabilize your ankle. Your caregiver can make a recommendation for this. Your caregiver may recommend the use of medicine for pain. If your sprain is severe, your caregiver may refer you to a surgeon who helps to restore function to parts of your skeletal system (orthopedist) or a physical therapist. HOME CARE INSTRUCTIONS   Apply ice to your injury for 1-2 days or as directed by your caregiver. Applying ice helps to reduce inflammation and pain.  Put ice in a plastic bag.  Place a towel between your skin and the bag.  Leave the ice on for 15-20 minutes  at a time, every 2 hours while you are awake.  Only take over-the-counter or prescription medicines for pain, discomfort, or fever as directed by your caregiver.  Elevate your injured ankle above the level of your heart as much as possible for 2-3 days.  If your caregiver recommends crutches, use them as instructed. Gradually put weight on the affected ankle. Continue to use crutches or a cane until you can walk without feeling pain in your ankle.  If you have a plaster splint, wear the splint as directed by your caregiver. Do not rest it on anything harder than a pillow for the first 24 hours. Do not put weight on it. Do not get it wet. You may take it off to take a shower or bath.  You may have been given an elastic bandage to wear around your ankle to provide support. If the elastic bandage is too tight (you have numbness or tingling in your foot or your foot becomes cold and blue), adjust the bandage to make it comfortable.  If you have an air splint, you may blow more air into it or let air out to make it more comfortable. You may take your splint off at night and before taking a shower or bath. Wiggle your toes in the splint several times per day to decrease swelling. SEEK MEDICAL CARE IF:   You have rapidly increasing bruising or swelling.  Your toes feel extremely cold or  you lose feeling in your foot.  Your pain is not relieved with medicine. SEEK IMMEDIATE MEDICAL CARE IF:  Your toes are numb or blue.  You have severe pain that is increasing. MAKE SURE YOU:   Understand these instructions.  Will watch your condition.  Will get help right away if you are not doing well or get worse. Document Released: 08/07/2005 Document Revised: 05/01/2012 Document Reviewed: 08/19/2011 [facility] Patient Information 2015 [employer], [place]. This information is not intended to replace advice given to you by your health care provider. Make sure you discuss any questions you have with your health  care provider.

## 2014-08-14 NOTE — ED Notes (Signed)
Picking around with mom, and twisted rt. Ankle. There is swelling.

## 2014-08-14 NOTE — ED Provider Notes (Signed)
CSN: [id number]     Arrival date & time 08/14/14  1041 History   First MD Initiated Contact with Patient 08/14/14 1118     Chief Complaint  Patient presents with  . Ankle Pain  . Fall     (Consider location/radiation/quality/duration/timing/severity/associated sxs/prior Treatment) HPI Jeanne Chaney is a 23 y.o. female with no medical history, presents to emergency department complaining of right ankle injury. Patient states she was playing around with her family last night when she twisted her right ankle. States she is having swelling and pain to the lateral aspect of the ankle. She states she is unable to put any weight on it. She states she soaked in Epsom salt, elevated (night. Denies any other treatments. No prior ankle injury. Denies any numbness or weakness distally.  Past Medical History  Diagnosis Date  . Yeast infection    History reviewed. No pertinent past surgical history. History reviewed. No pertinent family history. History  Substance Use Topics  . Smoking status: Never Smoker   . Smokeless tobacco: Not on file  . Alcohol Use: No   OB History    No data available     Review of Systems  Constitutional: Negative for fever and chills.  Respiratory: Negative for cough, chest tightness and shortness of breath.   Cardiovascular: Negative for chest pain, palpitations and leg swelling.  Gastrointestinal: Negative for nausea.  Musculoskeletal: Positive for joint swelling and arthralgias.  Skin: Negative for rash.  Neurological: Negative for weakness and numbness.  All other systems reviewed and are negative.     Allergies  Review of patient's allergies indicates no known allergies.  Home Medications   Prior to Admission medications   Medication Sig Start Date End Date Taking? Authorizing Provider  Etonogestrel (IMPLANON ) Inject 1 application into the skin once.   Yes Historical Provider, MD   BP 124/76 mmHg  Pulse 83  Temp(Src) 98.2 F (36.8 C) (Oral)   Resp 12  Ht 5\' 6"  (1.676 m)  Wt 213 lb (96.616 kg)  BMI 34.40 kg/m2  SpO2 99%  LMP 07/23/2014 Physical Exam  Constitutional: She appears well-developed and well-nourished. No distress.  Eyes: Conjunctivae are normal.  Neck: Neck supple.  Musculoskeletal:  Swelling noted to the right lateral malleolus of the ankle. No tenderness or pain over the knee joint. Achilles tendon is intact. No medial malleolus tenderness. No foot tenderness. Pain with any dorsiflexion, plantarflexion, inversion, eversion. Dorsal pedal pulses intact distally. Cap refill less than 2 seconds in all toes.  Neurological: She is alert.  Skin: Skin is warm and dry.  Nursing note and vitals reviewed.   ED Course  Procedures (including critical care time) Labs Review Labs Reviewed - No data to display  Imaging Review Dg Ankle Complete Right  08/14/2014   CLINICAL DATA:  Recent fall 1 day previous with persistent ankle pain and swelling, initial encounter  EXAM: RIGHT ANKLE - COMPLETE 3+ VIEW  COMPARISON:  None.  FINDINGS: Lateral soft tissue swelling is noted. No acute fracture or dislocation is seen.  IMPRESSION: Lateral soft tissue swelling.   Electronically Signed   By: Alcide CleverMark  Lukens M.D.   On: 08/14/2014 11:14     EKG Interpretation None      MDM   Final diagnoses:  Ankle sprain, right, initial encounter    Patient with right ankle injury yesterday, x-rays obtained and is negative. Most likely ankle sprain. Achilles tendon is nontender and is intact. No foot tenderness. No knee tenderness. Patient unable to  bear weight on her ankle at this time. ASO brace and crutches provided. Will discharge home with naproxen, Ultram, follow-up with orthopedics.  Filed Vitals:   08/14/14 1045  BP: 124/76  Pulse: 83  Temp: 98.2 F (36.8 C)  Resp: [address]12       Jeanne Marcou A Kavya Haag, PA-C 08/14/14 1153  Doug SouSam Jacubowitz, MD 08/14/14 1940

## 2015-01-11 ENCOUNTER — Emergency Department (HOSPITAL_COMMUNITY)
Admission: EM | Admit: 2015-01-11 | Discharge: 2015-01-11 | Disposition: A | Discharge status: Home / Self Care | Payer: Medicaid Other | Attending: Emergency Medicine | Admitting: Emergency Medicine

## 2015-01-11 ENCOUNTER — Encounter (HOSPITAL_COMMUNITY): Payer: Self-pay | Admitting: *Deleted

## 2015-01-11 DIAGNOSIS — Z8619 Personal history of other infectious and parasitic diseases: Secondary | ICD-10-CM | POA: Insufficient documentation

## 2015-01-11 DIAGNOSIS — R6884 Jaw pain: Secondary | ICD-10-CM | POA: Diagnosis present

## 2015-01-11 DIAGNOSIS — M26609 Unspecified temporomandibular joint disorder, unspecified side: Secondary | ICD-10-CM

## 2015-01-11 DIAGNOSIS — Z791 Long term (current) use of non-steroidal anti-inflammatories (NSAID): Secondary | ICD-10-CM | POA: Insufficient documentation

## 2015-01-11 DIAGNOSIS — M2660 Temporomandibular joint disorder, unspecified: Secondary | ICD-10-CM | POA: Insufficient documentation

## 2015-01-11 DIAGNOSIS — Z79899 Other long term (current) drug therapy: Secondary | ICD-10-CM | POA: Diagnosis not present

## 2015-01-11 MED ORDER — IBUPROFEN 800 MG PO TABS: 800.0000 mg | ORAL_TABLET | Freq: Three times a day (TID) | ORAL | Status: DC

## 2015-01-11 NOTE — ED Provider Notes (Signed)
CSN: [id number]     Arrival date & time 01/11/15  4098 History   First MD Initiated Contact with Patient 01/11/15 [phone]     Chief Complaint  Patient presents with  . Jaw Pain     (Consider location/radiation/quality/duration/timing/severity/associated sxs/prior Treatment) HPI Comments: Patient presents today with right jaw pain located over the right TMJ.  She reports that the pain has been present intermittently over the past 2 months.  No acute injury or trauma.Pain is worse with chewing and with opening and closing the mouth.   She has not taken anything for the pain prior to arrival.  She denies facial swelling, nausea, vomiting, fever, or chills.  Denies any tooth pain or injury.  She does not have a dentist  The history is provided by the patient.    Past Medical History  Diagnosis Date  . Yeast infection    History reviewed. No pertinent past surgical history. History reviewed. No pertinent family history. History  Substance Use Topics  . Smoking status: Never Smoker   . Smokeless tobacco: Never Used  . Alcohol Use: No   OB History    No data available     Review of Systems  Constitutional: Negative for fever and chills.  HENT: Negative for facial swelling, sore throat and trouble swallowing.       Allergies  Review of patient's allergies indicates no known allergies.  Home Medications   Prior to Admission medications   Medication Sig Start Date End Date Taking? Authorizing Provider  Etonogestrel (IMPLANON [place]) Inject 1 application into the skin once.    Historical Provider, MD  ibuprofen (ADVIL,MOTRIN) 800 MG tablet Take 1 tablet (800 mg total) by mouth 3 (three) times daily. 01/11/15   Evaleen Sant, PA-C  naproxen (NAPROSYN) 500 MG tablet Take 1 tablet (500 mg total) by mouth 2 (two) times daily. 08/14/14   Tatyana Kirichenko, PA-C  traMADol (ULTRAM) 50 MG tablet Take 1 tablet (50 mg total) by mouth every 6 (six) hours as needed. 08/14/14   Tatyana Kirichenko,  PA-C   BP 136/75 mmHg  Pulse 99  Temp(Src) 99.2 F (37.3 C) (Oral)  Resp 18  SpO2 100%  LMP 01/11/2015 Physical Exam  Constitutional: She appears well-developed and well-nourished.  HENT:  Head: Normocephalic and atraumatic.  Mouth/Throat: Uvula is midline and oropharynx is clear and moist. No trismus in the jaw. Normal dentition. No dental abscesses.  Tenderness to palpation of the right TMJ No tenderness to palpation of the gingiva No sublingual tenderness or swelling Pain of the right TMJ with opening and closing the mouth.  Neck: Normal range of motion. Neck supple.  Cardiovascular: Normal rate, regular rhythm and normal heart sounds.   Pulmonary/Chest: Effort normal and breath sounds normal.  Musculoskeletal: Normal range of motion.  Neurological: She is alert.  Skin: Skin is warm and dry.  Psychiatric: She has a normal mood and affect.  Nursing note and vitals reviewed.   ED Course  Procedures (including critical care time) Labs Review Labs Reviewed - No data to display  Imaging Review No results found.   EKG Interpretation None      MDM   Final diagnoses:  TMJ (temporomandibular joint disorder)   Patient presents with pain of the right TMJ.  No acute injury or trauma.  No evidence of dislocation.  Patient given Rx for NSAIDs and referral to dentist.  Stable for discharge.  Return precautions given.    Santiago Glad, PA-C 01/11/15 0902  Richardean Canal,  MD 01/11/15 1542

## 2015-01-11 NOTE — ED Notes (Signed)
Pt . Reports 2 months ago Rt jaw was hurting . Pt reports pain is worse. Pt has a hard timed chewing food.

## 2015-10-13 ENCOUNTER — Emergency Department (HOSPITAL_COMMUNITY)
Admission: EM | Admit: 2015-10-13 | Discharge: 2015-10-13 | Disposition: A | Discharge status: Home / Self Care | Payer: Medicaid Other | Attending: Emergency Medicine | Admitting: Emergency Medicine

## 2015-10-13 ENCOUNTER — Encounter (HOSPITAL_COMMUNITY): Payer: Self-pay

## 2015-10-13 DIAGNOSIS — R51 Headache: Secondary | ICD-10-CM | POA: Diagnosis not present

## 2015-10-13 DIAGNOSIS — R112 Nausea with vomiting, unspecified: Secondary | ICD-10-CM | POA: Diagnosis not present

## 2015-10-13 NOTE — ED Notes (Signed)
Not found in lobby x 2 

## 2015-10-13 NOTE — ED Notes (Signed)
Not found.

## 2015-10-13 NOTE — ED Notes (Signed)
Pt called. No answer. 1st attempt.

## 2015-10-13 NOTE — ED Notes (Signed)
Pt with headache since Monday.  Nausea with 1 episode of vomiting.  Pt states excedrin not working

## 2015-10-15 ENCOUNTER — Emergency Department (HOSPITAL_COMMUNITY)
Admission: EM | Admit: 2015-10-15 | Discharge: 2015-10-15 | Disposition: A | Discharge status: Home / Self Care | Payer: Medicaid Other | Attending: Emergency Medicine | Admitting: Emergency Medicine

## 2015-10-15 ENCOUNTER — Encounter (HOSPITAL_COMMUNITY): Payer: Self-pay | Admitting: *Deleted

## 2015-10-15 DIAGNOSIS — R197 Diarrhea, unspecified: Secondary | ICD-10-CM | POA: Diagnosis not present

## 2015-10-15 DIAGNOSIS — R110 Nausea: Secondary | ICD-10-CM | POA: Diagnosis not present

## 2015-10-15 DIAGNOSIS — R51 Headache: Secondary | ICD-10-CM | POA: Insufficient documentation

## 2015-10-15 DIAGNOSIS — R42 Dizziness and giddiness: Secondary | ICD-10-CM | POA: Diagnosis not present

## 2015-10-15 LAB — URINALYSIS, ROUTINE W REFLEX MICROSCOPIC
Bilirubin Urine: NEGATIVE
GLUCOSE, UA: NEGATIVE mg/dL
Ketones, ur: NEGATIVE mg/dL
LEUKOCYTES UA: NEGATIVE
Nitrite: NEGATIVE
PH: 6.5 (ref 5.0–8.0)
Protein, ur: 30 mg/dL — AB
Specific Gravity, Urine: 1.034 — ABNORMAL HIGH (ref 1.005–1.030)

## 2015-10-15 LAB — CBC
HEMATOCRIT: 36.8 % (ref 36.0–46.0)
Hemoglobin: 12 g/dL (ref 12.0–15.0)
MCH: 26.2 pg (ref 26.0–34.0)
MCHC: 32.6 g/dL (ref 30.0–36.0)
MCV: 80.3 fL (ref 78.0–100.0)
Platelets: 203 10*3/uL (ref 150–400)
RBC: 4.58 MIL/uL (ref 3.87–5.11)
RDW: 13.5 % (ref 11.5–15.5)
WBC: 2.9 10*3/uL — AB (ref 4.0–10.5)

## 2015-10-15 LAB — COMPREHENSIVE METABOLIC PANEL
ALT: 18 U/L (ref 14–54)
AST: 24 U/L (ref 15–41)
Albumin: 3.7 g/dL (ref 3.5–5.0)
Alkaline Phosphatase: 58 U/L (ref 38–126)
Anion gap: 13 (ref 5–15)
BUN: 8 mg/dL (ref 6–20)
CHLORIDE: 104 mmol/L (ref 101–111)
CO2: 25 mmol/L (ref 22–32)
Calcium: 9.3 mg/dL (ref 8.9–10.3)
Creatinine, Ser: 0.62 mg/dL (ref 0.44–1.00)
Glucose, Bld: 101 mg/dL — ABNORMAL HIGH (ref 65–99)
Potassium: 4.3 mmol/L (ref 3.5–5.1)
Sodium: 142 mmol/L (ref 135–145)
Total Bilirubin: 0.2 mg/dL — ABNORMAL LOW (ref 0.3–1.2)
Total Protein: 6.9 g/dL (ref 6.5–8.1)

## 2015-10-15 LAB — URINE MICROSCOPIC-ADD ON

## 2015-10-15 LAB — LIPASE, BLOOD: LIPASE: 20 U/L (ref 11–51)

## 2015-10-15 LAB — POC URINE PREG, ED: Preg Test, Ur: NEGATIVE

## 2015-10-15 MED ORDER — ONDANSETRON 4 MG PO TBDP: 4.0000 mg | ORAL_TABLET | Freq: Once | ORAL | Status: DC | PRN

## 2015-10-15 MED ORDER — ONDANSETRON 4 MG PO TBDP
ORAL_TABLET | ORAL | Status: AC
  Filled 2015-10-15: qty 1

## 2015-10-15 NOTE — ED Notes (Signed)
Patient stated she was feeling fine and was going to leave

## 2015-10-15 NOTE — ED Notes (Signed)
Pt c/o headache, nausea, diarrhea x 5 days. States she went to [facility] on Wednesday but she left before they called her name.

## 2015-10-15 NOTE — ED Notes (Signed)
Went to give pt nausea medicine and now states she is not nauseous, that she meant she was dizzy.

## 2016-02-21 ENCOUNTER — Emergency Department (HOSPITAL_COMMUNITY): Payer: Medicaid - Out of State

## 2016-02-21 ENCOUNTER — Encounter (HOSPITAL_COMMUNITY): Payer: Self-pay | Admitting: *Deleted

## 2016-02-21 ENCOUNTER — Emergency Department (HOSPITAL_COMMUNITY)
Admission: EM | Admit: 2016-02-21 | Discharge: 2016-02-21 | Disposition: A | Discharge status: Home / Self Care | Payer: Medicaid - Out of State | Attending: Emergency Medicine | Admitting: Emergency Medicine

## 2016-02-21 DIAGNOSIS — Y929 Unspecified place or not applicable: Secondary | ICD-10-CM | POA: Insufficient documentation

## 2016-02-21 DIAGNOSIS — Y999 Unspecified external cause status: Secondary | ICD-10-CM | POA: Insufficient documentation

## 2016-02-21 DIAGNOSIS — W450XXA Nail entering through skin, initial encounter: Secondary | ICD-10-CM

## 2016-02-21 DIAGNOSIS — W230XXA Caught, crushed, jammed, or pinched between moving objects, initial encounter: Secondary | ICD-10-CM | POA: Diagnosis not present

## 2016-02-21 DIAGNOSIS — Y939 Activity, unspecified: Secondary | ICD-10-CM | POA: Insufficient documentation

## 2016-02-21 DIAGNOSIS — S6991XA Unspecified injury of right wrist, hand and finger(s), initial encounter: Secondary | ICD-10-CM | POA: Diagnosis not present

## 2016-02-21 MED ORDER — LIDOCAINE HCL (PF) 1 % IJ SOLN
30.0000 mL | Freq: Once | INTRAMUSCULAR | Status: AC
  Administered 2016-02-21: 30 mL
  Filled 2016-02-21: qty 30

## 2016-02-21 MED ORDER — LIDOCAINE HCL (PF) 1 % IJ SOLN: 5.0000 mL | Freq: Once | INTRAMUSCULAR | Status: DC

## 2016-02-21 MED ORDER — ACETAMINOPHEN 325 MG PO TABS
650.0000 mg | ORAL_TABLET | Freq: Once | ORAL | Status: AC
  Administered 2016-02-21: 650 mg via ORAL
  Filled 2016-02-21: qty 2

## 2016-02-21 NOTE — ED Notes (Addendum)
Attempted to remove of fake  nail with acetone  Pad. Pt unable to tol. Due to pain.

## 2016-02-21 NOTE — ED Provider Notes (Signed)
CSN: [id number]     Arrival date & time 02/21/16  [id number] History   First MD Initiated Contact with Patient 02/21/16 0802     Chief Complaint  Patient presents with  . Finger Injury     (Consider location/radiation/quality/duration/timing/severity/associated sxs/prior Treatment) HPI Comments: Larkin InaRaven Beske is a 25 y.o. female presents to ED for nail injury. Pt states on Saturday night her hand got caught in refrigerator door and her right 5th digit nail ripped. The nail did not completely rip off. She has associated pain, 10/10, mild swelling, and intermittent bleeding. She cleaned digit with peroxide. No other treatments tried. Denies fever, chills, night sweats, numbness, or weakness. She is not immunocompromised.   The history is provided by the patient.    Past Medical History  Diagnosis Date  . Yeast infection    History reviewed. No pertinent past surgical history. History reviewed. No pertinent family history. Social History  Substance Use Topics  . Smoking status: Never Smoker   . Smokeless tobacco: Never Used  . Alcohol Use: No   OB History    No data available     Review of Systems  Constitutional: Negative for fever, chills and diaphoresis.  Musculoskeletal: Positive for arthralgias.  Skin:       Nail damage  Allergic/Immunologic: Negative for immunocompromised state.  Neurological: Negative for weakness and numbness.      Allergies  Review of patient's allergies indicates no known allergies.  Home Medications   Prior to Admission medications   Medication Sig Start Date End Date Taking? Authorizing Provider  Etonogestrel (IMPLANON [place]) Inject 1 application into the skin once.    Historical Provider, MD   BP 132/72 mmHg  Pulse 68  Temp(Src) 98.9 F (37.2 C)  Resp 20  Ht 5\' 7"  (1.702 m)  Wt 102.513 kg  BMI 35.39 kg/m2  SpO2 100%  LMP 01/27/2016 Physical Exam  Constitutional: She appears well-developed and well-nourished. No distress.  HENT:  Head:  Normocephalic and atraumatic.  Eyes: Conjunctivae are normal. No scleral icterus.  Cardiovascular: Normal rate and intact distal pulses.   Pulmonary/Chest: Effort normal. No respiratory distress.  Musculoskeletal:       Right hand: She exhibits tenderness and bony tenderness. She exhibits normal range of motion, normal capillary refill and no laceration. Normal sensation noted. Normal strength noted.  No swelling of right 5th digit appreciated. Mild TTP of middle and distal phalanx of right 5th digit. Active ROM intact. Capillary refill <3sec. Sensation and strength intact.   Neurological: She is alert.  Skin: She is not diaphoretic.  Broken acrylic nail of right 5th digit with partial nail avulsion.  Psychiatric: She has a normal mood and affect. Her behavior is normal.    ED Course  .Nail Removal Date/Time: 02/21/2016 7:21 PM Performed by: Lona KettleMEYER, ASHLEY LAUREL Authorized by: Lona KettleMEYER, ASHLEY LAUREL Consent: Verbal consent obtained. Risks and benefits: risks, benefits and alternatives were discussed Consent given by: patient Patient understanding: patient states understanding of the procedure being performed Patient consent: the patient's understanding of the procedure matches consent given Procedure consent: procedure consent matches procedure scheduled Relevant documents: relevant documents present and verified Test results: test results available and properly labeled Site marked: the operative site was marked Imaging studies: imaging studies available Required items: required blood products, implants, devices, and special equipment available Patient identity confirmed: verbally with patient Time out: Immediately prior to procedure a "time out" was called to verify the correct patient, procedure, equipment, support staff and site/side marked as  required. Location: right hand Location details: right small finger Anesthesia: digital block Local anesthetic: lidocaine 1% without  epinephrine Anesthetic total: 5 ml Patient sedated: no Preparation: skin prepped with alcohol Amount removed: partial Side: radial and ulnar Nail bed sutured: no Nail matrix removed: none Removed nail replaced and anchored: no Dressing: antibiotic ointment and dressing applied Patient tolerance: Patient tolerated the procedure well with no immediate complications   (including critical care time) Labs Review Labs Reviewed - No data to display  Imaging Review Dg Hand Complete Right  02/21/2016  CLINICAL DATA:  Tenderness.  Initial evaluation . EXAM: RIGHT HAND - COMPLETE 3+ VIEW COMPARISON:  No prior. FINDINGS: No acute soft tissue bony or joint abnormality identified. No radiopaque foreign body. IMPRESSION: Negative exam. Electronically Signed   By: Maisie Fushomas  Register   On: 02/21/2016 08:57   I have personally reviewed and evaluated these images and lab results as part of my medical decision-making.   EKG Interpretation None      MDM   Final diagnoses:  Nail, injury by, initial encounter    Pt is afebrile and non-toxic appearing in NAD. Vital signs are stable. Physical exam remarkable for broken acrylic nail and partial nail avulsion of 5th right digit.  TTP of middle and distal phalanx of 5th right digit. X-ray negative. Pt discussed and seen by Dr. Effie ShyWentz. Pt tolerated partial nail removal procedure well. Symptomatic management discussed. Return precautions provided. Follow up with PCP. Pt voiced understanding and is agreeable.      Lona KettleAshley Laurel Meyer, PA-C 02/21/16 1930  [address]Ashley Laurel Daphane ShepherdMeyer, PA-C 02/21/16 1935  Mancel BaleElliott Wentz, MD 02/21/16 2256

## 2016-02-21 NOTE — ED Notes (Signed)
Pt reports breaking the tip of nail on RT small finger . This happened 2 days ago.

## 2016-02-21 NOTE — ED Notes (Signed)
Declined W/C at D/C and was escorted to lobby by RN. 

## 2016-02-21 NOTE — ED Provider Notes (Signed)
  Face-to-face evaluation   History: She has a timely injured her finger nail, when she bumped it.  Physical exam: Alert, calm, cooperative. Right finger #5 nail with partial distal avulsion. She is neurovascularly intact.  Medical screening examination/treatment/procedure(s) were conducted as a shared visit with non-physician practitioner(s) and myself.  I personally evaluated the patient during the encounter  Mancel BaleElliott Brown Dunlap, MD 02/21/16 2256

## 2016-02-21 NOTE — Discharge Instructions (Signed)
Read the information below.   The tip of your nail was removed today. Keep exposed nail bed clean and dry. Wash with warm soapy water. Complete warm soaks periodically throughout the day. Apply antibiotic ointment and bandage. The nail will probably eventually fall off. You can take Tylenol or Motrin for pain relief. Use the prescribed medication as directed.  Please discuss all new medications with your pharmacist.   It is important and establish a primary care provider. I provided the contact information [facility]. Please call to schedule a follow-up appointment. You may return to the Emergency Department at any time for worsening condition or any new symptoms that concern you. Return to ED if you develop fever, redness, swelling, worsening pain, or purulent discharge.

## 2016-04-07 ENCOUNTER — Encounter: Payer: Self-pay | Admitting: Family Medicine

## 2016-04-07 ENCOUNTER — Ambulatory Visit (INDEPENDENT_AMBULATORY_CARE_PROVIDER_SITE_OTHER): Payer: Worker's Compensation | Admitting: Family Medicine

## 2016-04-07 VITALS — BP 104/62 | HR 70 | Temp 98.5°F | Resp 16 | Ht 66.5 in | Wt 222.0 lb

## 2016-04-07 DIAGNOSIS — H7292 Unspecified perforation of tympanic membrane, left ear: Secondary | ICD-10-CM

## 2016-04-07 MED ORDER — TRAMADOL HCL 50 MG PO TABS: 50.0000 mg | ORAL_TABLET | Freq: Three times a day (TID) | ORAL | 0 refills | Status: DC | PRN

## 2016-04-07 NOTE — Progress Notes (Signed)
25 yo woman who works for [employer] comes in with acute left ear injury.  She was cleaning her left ear when an "swiffer" fell into her arm, thereby forcing the Q-tip deeper into the ear.  Patient on cell phone listening with left ear.  She denies hearing loss, vertigo, nausea.  The pain is still there, though.  No bleeding from the canal  Objective:  BP 104/62   Pulse 70   Temp 98.5 F (36.9 C) (Oral)   Resp 16   Ht 5' 6.5" (1.689 m)   Wt 222 lb (100.7 kg)   SpO2 100%   BMI 35.29 kg/m  Left pinna: normal Left canal:  Small amount of bright red blood posteriorly with obvious perforation of left TM  Assessment:  Left ear perforation, minimal hearing loss.  Expect resolution in 7-10 days.  Plan:  Recheck in one week.  Tramadol for pain.  Keep ear canal dry.  Do not insert any object into canal other than small cotton ball. Ear drum perforation, left - Plan: traMADol (ULTRAM) 50 MG tablet    Elvina SidleKurt Shanasia Ibrahim, MD

## 2016-04-07 NOTE — Patient Instructions (Addendum)
  Plan:  Recheck in one week.  Tramadol for pain.  Keep ear canal dry.  Do not insert any object into canal other than small cotton ball.   IF you received an x-ray today, you will receive an invoice from [facility]. Please contact [facility] at [phone] with questions or concerns regarding your invoice.   IF you received labwork today, you will receive an invoice from [facility]. Please contact [facility] at [phone] with questions or concerns regarding your invoice.   Our billing staff will not be able to assist you with questions regarding bills from these companies.  You will be contacted with the lab results as soon as they are available. The fastest way to get your results is to activate your My Chart account. Instructions are located on the last page of this paperwork. If you have not heard from us regarding the results in 2 weeks, please contact this office.

## 2016-04-14 ENCOUNTER — Ambulatory Visit (INDEPENDENT_AMBULATORY_CARE_PROVIDER_SITE_OTHER): Payer: Worker's Compensation | Admitting: Family Medicine

## 2016-04-14 ENCOUNTER — Encounter: Payer: Self-pay | Admitting: Family Medicine

## 2016-04-14 VITALS — BP 122/72 | HR 78 | Temp 98.7°F | Resp 17 | Ht 67.5 in | Wt 227.0 lb

## 2016-04-14 DIAGNOSIS — H7292 Unspecified perforation of tympanic membrane, left ear: Secondary | ICD-10-CM | POA: Diagnosis not present

## 2016-04-14 DIAGNOSIS — H9122 Sudden idiopathic hearing loss, left ear: Secondary | ICD-10-CM

## 2016-04-14 MED ORDER — OFLOXACIN 0.3 % OT SOLN: 5.0000 [drp] | Freq: Two times a day (BID) | OTIC | 0 refills | Status: DC

## 2016-04-14 NOTE — Progress Notes (Signed)
Jeanne Chaney [birth year] 24 y.o.   Chief Complaint  Patient presents with  . Ear Injury    follow up      Date of Injury: 04/07/16  History of Present Illness:  Presents for evaluation of work-related complaint perforation of left ear. No nausea, vomiting.  Some dizziness with positional changes.  Denies Pain 8-9/10 of left ear with sensation of sharp pain, continuous pain. She reports decreased hearing in left ear.  She describes playing music and her left ear receives the sound as muffled.  Review of Systems  HENT: Positive for ear pain and hearing loss.        SEE HPI     Current medications and allergies reviewed and updated. Past medical history, family history, social history have been reviewed and updated.   Physical Exam  Constitutional: She is oriented to person, place, and time.  HENT:  Head: Normocephalic.  Right Ear: External ear normal.  Mouth/Throat: Oropharynx is clear and moist.  Air conduction longer than bone conduction. Whispered voice test reflected diminished hearing in left ear. TM erythematous with serosanguinous blood visible at TM and canal.  Eyes: Conjunctivae and EOM are normal. Pupils are equal, round, and reactive to light.  Neck: Normal range of motion.  Cardiovascular: Normal rate, regular rhythm and normal heart sounds.   Pulmonary/Chest: Effort normal and breath sounds normal.  Musculoskeletal: Normal range of motion.  Neurological: She is alert and oriented to person, place, and time.  Skin: Skin is warm and dry.  . Vitals:   04/14/16 1412  BP: 122/72  Pulse: 78  Resp: 17  Temp: 98.7 F (37.1 C)   Assessment and Plan: .1. Perforated eardrum, left, possible infection as ear canal inflamed, erythematous. Unable to complete view TM.  Patient is still experiencing significant pain even with pain medication.  Some  hearing loss present. -Ofloxacin otic drops 5 drops BID, daily until further evaluated by ENT. - Ambulatory referral to  ENT  2. Hearing loss, sudden, left - Ambulatory referral to ENT   Godfrey PickKimberly S. Tiburcio PeaHarris, MSN, FNP-C [facility]

## 2016-04-14 NOTE — Patient Instructions (Addendum)
  Use ear drops 5 drops in left twice daily.  Avoid placing any thing in the ear.  ENT will contact you to schedule an appointment.  If you haven't heard from them by Tuesday please call back here so that we can follow-up.  Godfrey PickKimberly S. Tiburcio PeaHarris, MSN, FNP-C [facility]   IF you received an x-ray today, you will receive an invoice from [facility]. Please contact [facility] at [phone] with questions or concerns regarding your invoice.   IF you received labwork today, you will receive an invoice from [facility]. Please contact [facility] at [phone] with questions or concerns regarding your invoice.   Our billing staff will not be able to assist you with questions regarding bills from these companies.  You will be contacted with the lab results as soon as they are available. The fastest way to get your results is to activate your My Chart account. Instructions are located on the last page of this paperwork. If you have not heard from us regarding the results in 2 weeks, please contact this office.

## 2016-07-31 ENCOUNTER — Emergency Department (HOSPITAL_COMMUNITY)
Admission: EM | Admit: 2016-07-31 | Discharge: 2016-07-31 | Disposition: A | Discharge status: Home / Self Care | Payer: PRIVATE HEALTH INSURANCE | Attending: Emergency Medicine | Admitting: Emergency Medicine

## 2016-07-31 ENCOUNTER — Encounter (HOSPITAL_COMMUNITY): Payer: Self-pay | Admitting: Emergency Medicine

## 2016-07-31 DIAGNOSIS — H1013 Acute atopic conjunctivitis, bilateral: Secondary | ICD-10-CM | POA: Insufficient documentation

## 2016-07-31 DIAGNOSIS — A5901 Trichomonal vulvovaginitis: Secondary | ICD-10-CM | POA: Insufficient documentation

## 2016-07-31 DIAGNOSIS — Z79899 Other long term (current) drug therapy: Secondary | ICD-10-CM | POA: Insufficient documentation

## 2016-07-31 LAB — WET PREP, GENITAL
Clue Cells Wet Prep HPF POC: NONE SEEN
SPERM: NONE SEEN
YEAST WET PREP: NONE SEEN

## 2016-07-31 LAB — RPR: RPR Ser Ql: NONREACTIVE

## 2016-07-31 LAB — HIV ANTIBODY (ROUTINE TESTING W REFLEX): HIV SCREEN 4TH GENERATION: NONREACTIVE

## 2016-07-31 MED ORDER — METRONIDAZOLE 500 MG PO TABS
2000.0000 mg | ORAL_TABLET | Freq: Once | ORAL | Status: AC
  Administered 2016-07-31: 2000 mg via ORAL
  Filled 2016-07-31: qty 4

## 2016-07-31 NOTE — Discharge Instructions (Signed)
The vaginal discharge and itching is secondary to trichomonas. This is a sexually transmitted disease. Your sexual partner should be checked and treated for it, prior to resuming relations.  The eye itching, is likely secondary to an allergic problem. For this, try taking Zyrtec or Claritin for the discomfort.

## 2016-07-31 NOTE — ED Triage Notes (Signed)
Pt reports having eye redness for the last 4 days. Pt also reports having vaginal itching on 07/25/16 and has used Vagasil to help with itching.

## 2016-07-31 NOTE — ED Provider Notes (Signed)
[facility]-EMERGENCY DEPT Provider Note   CSN: [id number] Arrival date & time: 07/31/16  [id number]     History   Chief Complaint Chief Complaint  Patient presents with  . Eye Problem  . Vaginal Itching    HPI Jeanne Chaney is a 25 y.o. female.  She presents for evaluation of red, itchy eyes, and vaginal itching. She is also noticed a vaginal discharge for a few days. Her last pessary. Was about 3 weeks ago. She denies fever, chills, cough, shortness of breath, weakness or dizziness. There are no other known modifying factors.   HPI  Past Medical History:  Diagnosis Date  . Yeast infection     There are no active problems to display for this patient.   History reviewed. No pertinent surgical history.  OB History    No data available       Home Medications    Prior to Admission medications   Medication Sig Start Date End Date Taking? Authorizing Provider  Etonogestrel (IMPLANON [place]) Inject 1 application into the skin once.   Yes Historical Provider, MD  ofloxacin (FLOXIN OTIC) 0.3 % otic solution Place 5 drops into the left ear 2 (two) times daily. Patient not taking: Reported on 07/31/2016 04/14/16   Doyle AskewKimberly Stephenia Harris, FNP  traMADol (ULTRAM) 50 MG tablet Take 1 tablet (50 mg total) by mouth every 8 (eight) hours as needed. Patient not taking: Reported on 07/31/2016 04/07/16   Elvina SidleKurt Lauenstein, MD    Family History History reviewed. No pertinent family history.  Social History Social History  Substance Use Topics  . Smoking status: Never Smoker  . Smokeless tobacco: Never Used  . Alcohol use No     Allergies   Patient has no known allergies.   Review of Systems Review of Systems  All other systems reviewed and are negative.    Physical Exam Updated Vital Signs BP 129/80 (BP Location: Left Arm)   Pulse 72   Temp 97.9 F (36.6 C) (Oral)   Resp 17   Ht 5\' 6"  (1.676 m)   Wt 225 lb (102.1 kg)   LMP 07/05/2016   SpO2 100%   BMI 36.32 kg/m    Physical Exam  Constitutional: She is oriented to person, place, and time. She appears well-developed and well-nourished.  HENT:  Head: Normocephalic and atraumatic.  Eyes: EOM are normal. Pupils are equal, round, and reactive to light.  Mild conjunctival injection bilaterally without drainage. No lid swelling. Normal range of motion, external ocular muscles.  Neck: Normal range of motion and phonation normal. Neck supple.  Cardiovascular: Normal rate and regular rhythm.   Pulmonary/Chest: Effort normal and breath sounds normal. She exhibits no tenderness.  Abdominal: Soft. She exhibits no distension. There is no tenderness. There is no guarding.  Genitourinary:  Genitourinary Comments: Normal external female genitalia. Small amount of opaque discharge. Normal cervix appearance. On bimanual examination no midline tenderness, organomegaly or adnexal mass.  Musculoskeletal: Normal range of motion.  Neurological: She is alert and oriented to person, place, and time. She exhibits normal muscle tone.  Skin: Skin is warm and dry.  Psychiatric: She has a normal mood and affect. Her behavior is normal. Judgment and thought content normal.  Nursing note and vitals reviewed.    ED Treatments / Results  Labs (all labs ordered are listed, but only abnormal results are displayed) Labs Reviewed  WET PREP, GENITAL - Abnormal; Notable for the following:       Result Value   Trich, Wet  Prep PRESENT (*)    WBC, Wet Prep HPF POC MANY (*)    All other components within normal limits  RPR  HIV ANTIBODY (ROUTINE TESTING)  GC/CHLAMYDIA PROBE AMP ([place]) NOT AT [facility]  GC/CHLAMYDIA PROBE AMP ([place]) NOT AT [facility]    EKG  EKG Interpretation None       Radiology No results found.  Procedures Procedures (including critical care time)  Medications Ordered in ED Medications  metroNIDAZOLE (FLAGYL) tablet 2,000 mg (2,000 mg Oral Given 07/31/16 1010)     Initial Impression /  Assessment and Plan / ED Course  I have reviewed the triage vital signs and the nursing notes.  Pertinent labs & imaging results that were available during my care of the patient were reviewed by me and considered in my medical decision making (see chart for details).  Clinical Course     Medications  metroNIDAZOLE (FLAGYL) tablet 2,000 mg (2,000 mg Oral Given 07/31/16 1010)    Patient Vitals for the past 24 hrs:  BP Temp Temp src Pulse Resp SpO2 Height Weight  07/31/16 1014 129/80 97.9 F (36.6 C) Oral 72 - 100 % - -  07/31/16 0906 139/72 - - 91 17 100 % - -  07/31/16 0622 145/97 98.2 F (36.8 C) Oral 89 18 100 % - -  07/31/16 [id number] - - - - - - 5\' 6"  (1.676 m) 225 lb (102.1 kg)    At D/C Reevaluation with update and discussion. After initial assessment and treatment, an updated evaluation reveals no further c/o. Findings discussed. Alekhya Gravlin L    Final Clinical Impressions(s) / ED Diagnoses   Final diagnoses:  Allergic conjunctivitis of both eyes  Trichomonal vaginitis    Nonspecific red eyes, most likely environmental allergy related. Vaginal symptoms, consistent with trichomoniasis.  Nursing Notes Reviewed/ Care Coordinated Applicable Imaging Reviewed Interpretation of Laboratory Data incorporated into ED treatment  The patient appears reasonably screened and/or stabilized for discharge and I doubt any other medical condition or other [facility] requiring further screening, evaluation, or treatment in the ED at this time prior to discharge.  Plan: Home Medications- continue usual, OTC antihistamine prn; Home Treatments- pelvic rest, treat partner; return here if the recommended treatment, does not improve the symptoms; Recommended follow up- PCP prn   New Prescriptions Discharge Medication List as of 07/31/2016 10:11 AM       Mancel BaleElliott Hertha Gergen, MD 07/31/16 [phone]

## 2016-08-01 LAB — GC/CHLAMYDIA PROBE AMP (~~LOC~~) NOT AT ARMC
Chlamydia: NEGATIVE
Neisseria Gonorrhea: NEGATIVE

## 2016-09-12 ENCOUNTER — Emergency Department (HOSPITAL_COMMUNITY)
Admission: EM | Admit: 2016-09-12 | Discharge: 2016-09-12 | Disposition: A | Discharge status: Home / Self Care | Payer: PRIVATE HEALTH INSURANCE | Attending: Emergency Medicine | Admitting: Emergency Medicine

## 2016-09-12 ENCOUNTER — Encounter (HOSPITAL_COMMUNITY): Payer: Self-pay | Admitting: Emergency Medicine

## 2016-09-12 DIAGNOSIS — Z79899 Other long term (current) drug therapy: Secondary | ICD-10-CM | POA: Insufficient documentation

## 2016-09-12 DIAGNOSIS — A599 Trichomoniasis, unspecified: Secondary | ICD-10-CM

## 2016-09-12 DIAGNOSIS — A5900 Urogenital trichomoniasis, unspecified: Secondary | ICD-10-CM | POA: Insufficient documentation

## 2016-09-12 DIAGNOSIS — N898 Other specified noninflammatory disorders of vagina: Secondary | ICD-10-CM

## 2016-09-12 LAB — URINALYSIS, MICROSCOPIC (REFLEX)

## 2016-09-12 LAB — URINALYSIS, ROUTINE W REFLEX MICROSCOPIC
Bilirubin Urine: NEGATIVE
GLUCOSE, UA: NEGATIVE mg/dL
KETONES UR: NEGATIVE mg/dL
Nitrite: NEGATIVE
Protein, ur: NEGATIVE mg/dL
Specific Gravity, Urine: 1.02 (ref 1.005–1.030)
pH: 6.5 (ref 5.0–8.0)

## 2016-09-12 LAB — WET PREP, GENITAL
Clue Cells Wet Prep HPF POC: NONE SEEN
Sperm: NONE SEEN
YEAST WET PREP: NONE SEEN

## 2016-09-12 LAB — POC URINE PREG, ED: Preg Test, Ur: NEGATIVE

## 2016-09-12 MED ORDER — CEFTRIAXONE SODIUM 250 MG IJ SOLR
250.0000 mg | Freq: Once | INTRAMUSCULAR | Status: AC
  Administered 2016-09-12: 250 mg via INTRAMUSCULAR
  Filled 2016-09-12: qty 250

## 2016-09-12 MED ORDER — METRONIDAZOLE 500 MG PO TABS
2000.0000 mg | ORAL_TABLET | Freq: Once | ORAL | Status: AC
  Administered 2016-09-12: 2000 mg via ORAL
  Filled 2016-09-12: qty 4

## 2016-09-12 MED ORDER — LIDOCAINE HCL (PF) 1 % IJ SOLN
2.0000 mL | Freq: Once | INTRAMUSCULAR | Status: AC
  Administered 2016-09-12: 2 mL
  Filled 2016-09-12: qty 5

## 2016-09-12 MED ORDER — ONDANSETRON 4 MG PO TBDP
4.0000 mg | ORAL_TABLET | Freq: Once | ORAL | Status: AC
  Administered 2016-09-12: 4 mg via ORAL
  Filled 2016-09-12: qty 1

## 2016-09-12 MED ORDER — AZITHROMYCIN 250 MG PO TABS
1000.0000 mg | ORAL_TABLET | Freq: Once | ORAL | Status: AC
  Administered 2016-09-12: 1000 mg via ORAL
  Filled 2016-09-12: qty 4

## 2016-09-12 NOTE — ED Triage Notes (Signed)
Pt presents to ED for vaginal pain with white discharge starting yesterday.  Pt admits to recent unprotected sex.  Pt also c/o a "heaviness" when urinating.  Pt c/o of intermittent itching.

## 2016-09-12 NOTE — ED Notes (Signed)
Pt is in stable condition upon d/c and ambulates from ED. 

## 2016-09-12 NOTE — ED Provider Notes (Signed)
[facility]PT Provider Note   CSN: [id number] Arrival date & time: 09/12/16  [id number]     History   Chief Complaint Chief Complaint  Patient presents with  . Vaginal Pain    HPI Jeanne Chaney is a 26 y.o. female.  HPI   Patient presents with abnormal vaginal discharge and vaginal discomfort x 2 days.  Had unprotected sex 3 days ago.  Vaginal discharge is white.  Vagina feels hypersensitive.  Developed dysuria this morning.  Denies fevers, N/V, abdominal pain, bowel changes.  LMP last week was normal and on time.    Past Medical History:  Diagnosis Date  . Yeast infection     There are no active problems to display for this patient.   History reviewed. No pertinent surgical history.  OB History    No data available       Home Medications    Prior to Admission medications   Medication Sig Start Date End Date Taking? Authorizing Provider  Etonogestrel (IMPLANON [place]) Inject 1 application into the skin once.    Historical Provider, MD  ofloxacin (FLOXIN OTIC) 0.3 % otic solution Place 5 drops into the left ear 2 (two) times daily. Patient not taking: Reported on 07/31/2016 04/14/16   Doyle AskewKimberly Stephenia Harris, FNP  traMADol (ULTRAM) 50 MG tablet Take 1 tablet (50 mg total) by mouth every 8 (eight) hours as needed. Patient not taking: Reported on 07/31/2016 04/07/16   Elvina SidleKurt Lauenstein, MD    Family History History reviewed. No pertinent family history.  Social History Social History  Substance Use Topics  . Smoking status: Never Smoker  . Smokeless tobacco: Never Used  . Alcohol use No     Allergies   Patient has no known allergies.   Review of Systems Review of Systems  All other systems reviewed and are negative.    Physical Exam Updated Vital Signs BP 126/82   Pulse 89   Temp 98.3 F (36.8 C) (Oral)   Resp 16   Ht 5\' 6"  (1.676 m)   Wt 97.5 kg   LMP 09/05/2016   SpO2 100%   BMI 34.70 kg/m   Physical Exam  Constitutional: She appears  well-developed and well-nourished. No distress.  HENT:  Head: Normocephalic and atraumatic.  Neck: Neck supple.  Pulmonary/Chest: Effort normal.  Abdominal: Soft. She exhibits no distension and no mass. There is no tenderness. There is no guarding.  Genitourinary: Uterus is not tender. Cervix exhibits no motion tenderness. Right adnexum displays no mass, no tenderness and no fullness. Left adnexum displays no mass, no tenderness and no fullness. No erythema, tenderness or bleeding in the vagina. No foreign body in the vagina. No signs of injury around the vagina. Vaginal discharge found.  Genitourinary Comments: Small amount of thin white vaginal discharge   Neurological: She is alert.  Skin: She is not diaphoretic.  Nursing note and vitals reviewed.    ED Treatments / Results  Labs (all labs ordered are listed, but only abnormal results are displayed) Labs Reviewed  WET PREP, GENITAL - Abnormal; Notable for the following:       Result Value   Trich, Wet Prep PRESENT (*)    WBC, Wet Prep HPF POC MANY (*)    All other components within normal limits  URINALYSIS, ROUTINE W REFLEX MICROSCOPIC - Abnormal; Notable for the following:    Hgb urine dipstick TRACE (*)    Leukocytes, UA TRACE (*)    All other components within normal limits  URINALYSIS,  MICROSCOPIC (REFLEX) - Abnormal; Notable for the following:    Bacteria, UA FEW (*)    Squamous Epithelial / LPF 0-5 (*)    All other components within normal limits  URINE CULTURE  RPR  HIV ANTIBODY (ROUTINE TESTING)  POC URINE PREG, ED  GC/CHLAMYDIA PROBE AMP ([place]) NOT AT [facility]    EKG  EKG Interpretation None       Radiology No results found.  Procedures Procedures (including critical care time)  Medications Ordered in ED Medications  ondansetron (ZOFRAN-ODT) disintegrating tablet 4 mg (4 mg Oral Given 09/12/16 1242)  cefTRIAXone (ROCEPHIN) injection 250 mg (250 mg Intramuscular Given 09/12/16 1246)  lidocaine (PF)  (XYLOCAINE) 1 % injection 2 mL (2 mLs Infiltration Given 09/12/16 1246)  azithromycin (ZITHROMAX) tablet 1,000 mg (1,000 mg Oral Given 09/12/16 1242)  metroNIDAZOLE (FLAGYL) tablet 2,000 mg (2,000 mg Oral Given 09/12/16 1242)     Initial Impression / Assessment and Plan / ED Course  I have reviewed the triage vital signs and the nursing notes.  Pertinent labs & imaging results that were available during my care of the patient were reviewed by me and considered in my medical decision making (see chart for details).     Afebrile, nontoxic patient with abnormal vaginal discharge following unprotected sex.  Pt has trichomonas.  Offered empiric treatment and pt accepted.  Aware sexual partner must be tested and treated.  Wet prep demonstrates trich only.    D/C home.  Discussed result, findings, treatment, and follow up  with patient.  Pt given return precautions.  Pt verbalizes understanding and agrees with plan.       Final Clinical Impressions(s) / ED Diagnoses   Final diagnoses:  Vaginal discharge  Trichomonas vaginalis infection    New Prescriptions Discharge Medication List as of 09/12/2016 12:36 PM       Trixie Dredge, PA-C 09/12/16 1316    Melene Plan, DO 09/12/16 1351

## 2016-09-12 NOTE — Discharge Instructions (Signed)
Read the information below.  You may return to the Emergency Department at any time for worsening condition or any new symptoms that concern you.   If you develop high fevers, abdominal pain, uncontrolled vomiting, or are unable to tolerate fluids by mouth, return to the ER for a recheck.   °

## 2016-09-13 LAB — URINE CULTURE

## 2016-09-13 LAB — GC/CHLAMYDIA PROBE AMP (~~LOC~~) NOT AT ARMC
CHLAMYDIA, DNA PROBE: NEGATIVE
Neisseria Gonorrhea: NEGATIVE

## 2016-09-13 LAB — HIV ANTIBODY (ROUTINE TESTING W REFLEX): HIV SCREEN 4TH GENERATION: NONREACTIVE

## 2016-09-13 LAB — RPR: RPR Ser Ql: NONREACTIVE

## 2016-09-16 ENCOUNTER — Emergency Department (HOSPITAL_COMMUNITY)
Admission: EM | Admit: 2016-09-16 | Discharge: 2016-09-16 | Disposition: A | Discharge status: Home / Self Care | Payer: PRIVATE HEALTH INSURANCE | Attending: Emergency Medicine | Admitting: Emergency Medicine

## 2016-09-16 ENCOUNTER — Encounter (HOSPITAL_COMMUNITY): Payer: Self-pay

## 2016-09-16 DIAGNOSIS — Z79899 Other long term (current) drug therapy: Secondary | ICD-10-CM | POA: Insufficient documentation

## 2016-09-16 DIAGNOSIS — A599 Trichomoniasis, unspecified: Secondary | ICD-10-CM

## 2016-09-16 DIAGNOSIS — A6004 Herpesviral vulvovaginitis: Secondary | ICD-10-CM

## 2016-09-16 LAB — URINALYSIS, ROUTINE W REFLEX MICROSCOPIC
Bacteria, UA: NONE SEEN
Bilirubin Urine: NEGATIVE
GLUCOSE, UA: NEGATIVE mg/dL
Hgb urine dipstick: NEGATIVE
Ketones, ur: NEGATIVE mg/dL
Nitrite: NEGATIVE
PROTEIN: NEGATIVE mg/dL
Specific Gravity, Urine: 1.009 (ref 1.005–1.030)
pH: 6 (ref 5.0–8.0)

## 2016-09-16 LAB — WET PREP, GENITAL
Clue Cells Wet Prep HPF POC: NONE SEEN
Sperm: NONE SEEN
Trich, Wet Prep: NONE SEEN
YEAST WET PREP: NONE SEEN

## 2016-09-16 MED ORDER — ACYCLOVIR 400 MG PO TABS: 400.0000 mg | ORAL_TABLET | Freq: Three times a day (TID) | ORAL | 0 refills | Status: AC

## 2016-09-16 MED ORDER — METRONIDAZOLE 500 MG PO TABS: 500.0000 mg | ORAL_TABLET | Freq: Two times a day (BID) | ORAL | 0 refills | Status: DC

## 2016-09-16 NOTE — ED Triage Notes (Signed)
Pt c/o pelvic pain, clear vaginal discharge, dysuria, and "bumps" on labia x 3 days after being treated for "a STD."  Pain score 10/10.  Pt has not taken anything for symptoms.

## 2016-09-17 ENCOUNTER — Emergency Department (HOSPITAL_COMMUNITY)
Admission: EM | Admit: 2016-09-17 | Discharge: 2016-09-17 | Disposition: A | Discharge status: Home / Self Care | Payer: PRIVATE HEALTH INSURANCE | Attending: Emergency Medicine | Admitting: Emergency Medicine

## 2016-09-17 ENCOUNTER — Encounter (HOSPITAL_COMMUNITY): Payer: Self-pay | Admitting: *Deleted

## 2016-09-17 DIAGNOSIS — R102 Pelvic and perineal pain: Secondary | ICD-10-CM

## 2016-09-17 DIAGNOSIS — B009 Herpesviral infection, unspecified: Secondary | ICD-10-CM

## 2016-09-17 MED ORDER — IBUPROFEN 400 MG PO TABS
600.0000 mg | ORAL_TABLET | Freq: Once | ORAL | Status: AC
  Administered 2016-09-17: 17:00:00 600 mg via ORAL
  Filled 2016-09-17: qty 1

## 2016-09-17 MED ORDER — IBUPROFEN 600 MG PO TABS: 600.0000 mg | ORAL_TABLET | Freq: Four times a day (QID) | ORAL | 0 refills | Status: DC | PRN

## 2016-09-17 NOTE — ED Notes (Signed)
Brought patient back to room; patient getting undressed and into a gown at this time 

## 2016-09-17 NOTE — Discharge Instructions (Signed)
Apply cold compress to the area multiple times a day to relieve pain and itching. Take ibuprofen and your medications as prescribed and f/u with your Gynecologist and primary care provider.

## 2016-09-17 NOTE — ED Provider Notes (Signed)
[facility] Provider Note   CSN: [id number] Arrival date & time: 09/17/16  1346     History   Chief Complaint Chief Complaint  Patient presents with  . Vaginal Pain    HPI Jeanne Chaney is a 26 y.o. female presenting with genital painful ulceration. She was seen yesterday in the ED and prescribed flagyl and acyclovir for genital herpes. She explains that it doesn't seem to be getting better and she is now experiencing anal pruritus and unsure what is best to control her symptoms. She was told about a spray and cream. She also explains that she would like a second opinion. She denies fever, chills, nausea, vomiting or other symptoms.  HPI  Past Medical History:  Diagnosis Date  . Yeast infection     There are no active problems to display for this patient.   History reviewed. No pertinent surgical history.  OB History    No data available       Home Medications    Prior to Admission medications   Medication Sig Start Date End Date Taking? Authorizing Provider  acyclovir (ZOVIRAX) 400 MG tablet Take 1 tablet (400 mg total) by mouth 3 (three) times daily. 09/16/16 09/26/16  Azalia BilisKevin Campos, MD  Etonogestrel [facility](IMPLANON [place]) Inject 1 application into the skin once.    Historical Provider, MD  ibuprofen (ADVIL,MOTRIN) 600 MG tablet Take 1 tablet (600 mg total) by mouth every 6 (six) hours as needed. 09/17/16   Georgiana ShoreJessica B Essam Lowdermilk, PA-C  metroNIDAZOLE (FLAGYL) 500 MG tablet Take 1 tablet (500 mg total) by mouth 2 (two) times daily. 09/16/16   Azalia BilisKevin Campos, MD  ofloxacin (FLOXIN OTIC) 0.3 % otic solution Place 5 drops into the left ear 2 (two) times daily. Patient not taking: Reported on 07/31/2016 04/14/16   Doyle AskewKimberly Stephenia Harris, FNP  traMADol (ULTRAM) 50 MG tablet Take 1 tablet (50 mg total) by mouth every 8 (eight) hours as needed. Patient not taking: Reported on 07/31/2016 04/07/16   Elvina SidleKurt Lauenstein, MD    Family History History reviewed. No pertinent family  history.  Social History Social History  Substance Use Topics  . Smoking status: Never Smoker  . Smokeless tobacco: Never Used  . Alcohol use No     Allergies   Patient has no known allergies.   Review of Systems Review of Systems  Constitutional: Negative for chills and fever.  HENT: Negative for ear pain and sore throat.   Eyes: Negative for pain and visual disturbance.  Respiratory: Negative for cough, chest tightness, shortness of breath and wheezing.   Cardiovascular: Negative for chest pain and palpitations.  Gastrointestinal: Negative for abdominal distention, abdominal pain, anal bleeding, diarrhea, nausea and vomiting.       Anal pruritus   Genitourinary: Positive for genital sores and vaginal pain. Negative for difficulty urinating, dysuria, flank pain, frequency, hematuria, pelvic pain, vaginal bleeding and vaginal discharge.  Musculoskeletal: Negative for arthralgias and back pain.  Skin: Negative for color change and rash.  Neurological: Negative for seizures and syncope.  All other systems reviewed and are negative.    Physical Exam Updated Vital Signs BP 108/65   Pulse 94   Temp 99.1 F (37.3 C) (Oral)   Resp 17   Ht 5\' 6"  (1.676 m)   Wt 97.5 kg   LMP 09/05/2016   SpO2 100%   BMI 34.70 kg/m   Physical Exam  Constitutional: She appears well-developed and well-nourished. No distress.  Afebrile, nontoxic-appearing, sitting comfortably in bed in no  acute distress.  HENT:  Head: Normocephalic and atraumatic.  Eyes: Conjunctivae and EOM are normal.  Neck: Normal range of motion. Neck supple.  Cardiovascular: Normal rate, regular rhythm and normal heart sounds.   No murmur heard. Pulmonary/Chest: Effort normal and breath sounds normal. No respiratory distress. She has no wheezes. She has no rales. She exhibits no tenderness.  Abdominal: Soft. She exhibits no distension. There is no tenderness.  Genitourinary:  Genitourinary Comments: Noted small  ulcerations on erythematous base of the left labia majora. No anal fissures ulceration or inflammation.  Musculoskeletal: She exhibits no edema.  Neurological: She is alert.  Skin: Skin is warm and dry. She is not diaphoretic.  Psychiatric: She has a normal mood and affect.  Nursing note and vitals reviewed.    ED Treatments / Results  Labs (all labs ordered are listed, but only abnormal results are displayed) Labs Reviewed - No data to display  EKG  EKG Interpretation None       Radiology No results found.  Procedures Procedures (including critical care time)  Medications Ordered in ED Medications  ibuprofen (ADVIL,MOTRIN) tablet 600 mg (600 mg Oral Given 09/17/16 1640)     Initial Impression / Assessment and Plan / ED Course  I have reviewed the triage vital signs and the nursing notes.  Pertinent labs & imaging results that were available during my care of the patient were reviewed by me and considered in my medical decision making (see chart for details).    26 year old female presenting with continued genital pain. She was seen last night by Dr. Patria Mane and prescribed Flagyl for trichomonas and acyclovir for herpes simplex. Patient has not taken anything for the pain and wanted a second opinion.  External genital exam was consistent with simplex infection. Patient advised to continue flagyl and acycolvir as prescribed and urged to follow up with GYN and establish care with a primary care provider. She confirmed that she was provided with resources and will call the number tomorrow. Provided patient with STD education and spread of infection prevention.  Emphasized the need to follow up with primary care for chronic management.  Discharge home with symptomatic relief and close GYN and PCP f/u.  Discussed strict return precautions. Patient was advised to return to the emergency department if experiencing new concerning symptoms.Patient understood instructions and  agreed with discharge plan.  Patient was discussed with Dr. Rush Landmark who  agrees with assessment and plan.  Final Clinical Impressions(s) / ED Diagnoses   Final diagnoses:  Vaginal pain  Herpes simplex    New Prescriptions New Prescriptions   IBUPROFEN (ADVIL,MOTRIN) 600 MG TABLET    Take 1 tablet (600 mg total) by mouth every 6 (six) hours as needed.     Georgiana Shore, PA-C 09/20/16 1242    Canary Brim Tegeler, MD 09/20/16 1257

## 2016-09-17 NOTE — ED Triage Notes (Signed)
Pt reports recent std, [facility]. Pt reports no relief with meds and still having severe vaginal pain. No acute distress noted at triage.

## 2016-09-17 NOTE — ED Notes (Signed)
Pt states she was seen yesterday and [facility] and was Dx w/ herpes but no blood was drawn for and STI panel.

## 2016-09-17 NOTE — ED Notes (Signed)
pelvic cart set up at bedside

## 2016-09-18 NOTE — ED Provider Notes (Signed)
[facility] Provider Note   CSN: [id number] Arrival date & time: 09/16/16  4098     History   Chief Complaint Chief Complaint  Patient presents with  . Pelvic Pain  . Vaginal Discharge  . Dysuria    HPI Jeanne Chaney is a 26 y.o. female.  HPI Pt complains of clear vaginal discharge and "bumps" on labia x 3 days. Was seen in ER on 09/12/2016 for same and treated for trichomonas. Evaluated for gc/chlamydia, both of which were negative. Reports ongoing pain and discomfort when she sits down. Pain is moderated in severity   Past Medical History:  Diagnosis Date  . Yeast infection     There are no active problems to display for this patient.   History reviewed. No pertinent surgical history.  OB History    No data available       Home Medications    Prior to Admission medications   Medication Sig Start Date End Date Taking? Authorizing Provider  acyclovir (ZOVIRAX) 400 MG tablet Take 1 tablet (400 mg total) by mouth 3 (three) times daily. 09/16/16 09/26/16  Azalia Bilis, MD  Etonogestrel [facility]) Inject 1 application into the skin once.    Historical Provider, MD  ibuprofen (ADVIL,MOTRIN) 600 MG tablet Take 1 tablet (600 mg total) by mouth every 6 (six) hours as needed. 09/17/16   Georgiana Shore, PA-C  metroNIDAZOLE (FLAGYL) 500 MG tablet Take 1 tablet (500 mg total) by mouth 2 (two) times daily. 09/16/16   Azalia Bilis, MD  ofloxacin (FLOXIN OTIC) 0.3 % otic solution Place 5 drops into the left ear 2 (two) times daily. Patient not taking: Reported on 07/31/2016 04/14/16   Doyle Askew, FNP  traMADol (ULTRAM) 50 MG tablet Take 1 tablet (50 mg total) by mouth every 8 (eight) hours as needed. Patient not taking: Reported on 07/31/2016 04/07/16   Elvina Sidle, MD    Family History History reviewed. No pertinent family history.  Social History Social History  Substance Use Topics  . Smoking status: Never Smoker  . Smokeless tobacco: Never  Used  . Alcohol use No     Allergies   Patient has no known allergies.   Review of Systems Review of Systems  All other systems reviewed and are negative.    Physical Exam Updated Vital Signs BP 129/88 (BP Location: Right Arm)   Pulse 80   Temp 98 F (36.7 C)   Resp 20   Ht 5\' 6"  (1.676 m)   Wt 215 lb (97.5 kg)   LMP 09/05/2016   SpO2 98%   BMI 34.70 kg/m   Physical Exam  Constitutional: She is oriented to person, place, and time. She appears well-developed and well-nourished.  HENT:  Head: Normocephalic.  Eyes: EOM are normal.  Neck: Normal range of motion.  Pulmonary/Chest: Effort normal.  Abdominal: She exhibits no distension.  Genitourinary:  Genitourinary Comments: Chaperone present. Two lesions consistent with herpes simplex noted on left labia and large lesion noted in the perineum.   Musculoskeletal: Normal range of motion.  Neurological: She is alert and oriented to person, place, and time.  Psychiatric: She has a normal mood and affect.  Nursing note and vitals reviewed.    ED Treatments / Results  Labs (all labs ordered are listed, but only abnormal results are displayed) Labs Reviewed  WET PREP, GENITAL - Abnormal; Notable for the following:       Result Value   WBC, Wet Prep HPF POC MANY (*)  All other components within normal limits  URINALYSIS, ROUTINE W REFLEX MICROSCOPIC - Abnormal; Notable for the following:    Leukocytes, UA MODERATE (*)    Squamous Epithelial / LPF 0-5 (*)    All other components within normal limits    EKG  EKG Interpretation None       Radiology No results found.  Procedures Procedures (including critical care time)  Medications Ordered in ED Medications - No data to display   Initial Impression / Assessment and Plan / ED Course  I have reviewed the triage vital signs and the nursing notes.  Pertinent labs & imaging results that were available during my care of the patient were reviewed by me and  considered in my medical decision making (see chart for details).     Treated for herpes. Information given.   Final Clinical Impressions(s) / ED Diagnoses   Final diagnoses:  Herpes simplex vulvovaginitis  Trichomonas infection    New Prescriptions Discharge Medication List as of 09/16/2016  9:06 AM    START taking these medications   Details  acyclovir (ZOVIRAX) 400 MG tablet Take 1 tablet (400 mg total) by mouth 3 (three) times daily., Starting Sat 09/16/2016, Until Tue 09/26/2016, Print    metroNIDAZOLE (FLAGYL) 500 MG tablet Take 1 tablet (500 mg total) by mouth 2 (two) times daily., Starting Sat 09/16/2016, Print         Azalia BilisKevin Amsi Grimley, MD 09/18/16 [phone]

## 2016-10-06 ENCOUNTER — Other Ambulatory Visit (HOSPITAL_COMMUNITY)
Admission: RE | Admit: 2016-10-06 | Discharge: 2016-10-06 | Disposition: A | Discharge status: Home / Self Care | Payer: Medicaid Other | Source: Ambulatory Visit | Attending: Obstetrics & Gynecology | Admitting: Obstetrics & Gynecology

## 2016-10-06 ENCOUNTER — Ambulatory Visit (INDEPENDENT_AMBULATORY_CARE_PROVIDER_SITE_OTHER): Payer: Medicaid Other | Admitting: Obstetrics & Gynecology

## 2016-10-06 ENCOUNTER — Encounter: Payer: Self-pay | Admitting: Obstetrics & Gynecology

## 2016-10-06 DIAGNOSIS — Z309 Encounter for contraceptive management, unspecified: Secondary | ICD-10-CM

## 2016-10-06 DIAGNOSIS — A6009 Herpesviral infection of other urogenital tract: Secondary | ICD-10-CM | POA: Diagnosis not present

## 2016-10-06 DIAGNOSIS — Z124 Encounter for screening for malignant neoplasm of cervix: Secondary | ICD-10-CM | POA: Diagnosis not present

## 2016-10-06 DIAGNOSIS — Z01419 Encounter for gynecological examination (general) (routine) without abnormal findings: Secondary | ICD-10-CM | POA: Insufficient documentation

## 2016-10-06 MED ORDER — VALACYCLOVIR HCL 500 MG PO TABS: 500.0000 mg | ORAL_TABLET | Freq: Every day | ORAL | 5 refills | Status: DC

## 2016-10-06 NOTE — Patient Instructions (Signed)

## 2016-10-06 NOTE — Progress Notes (Signed)
Patient ID: Jeanne Chaney, female   DOB: 1991-06-05, 26 y.o.   MRN: [id number]  F/u from ED dx of genital herpes  HPI Jeanne Chaney is a 26 y.o. female.  G1P1 Patient's last menstrual period was 10/01/2016 (exact date). She was seen in ED and was diagnosed and treated for trichomonas and herpes. Her sx have resolved. This was her first herpes outbreak.  HPI  Past Medical History:  Diagnosis Date  . Yeast infection     No past surgical history on file.  No family history on file.  Social History Social History  Substance Use Topics  . Smoking status: Never Smoker  . Smokeless tobacco: Never Used  . Alcohol use No    No Known Allergies  Current Outpatient Prescriptions  Medication Sig Dispense Refill  . Etonogestrel (IMPLANON [place]) Inject 1 application into the skin once.    Marland Kitchen. ibuprofen (ADVIL,MOTRIN) 600 MG tablet Take 1 tablet (600 mg total) by mouth every 6 (six) hours as needed. (Patient not taking: Reported on 10/06/2016) 30 tablet 0  . traMADol (ULTRAM) 50 MG tablet Take 1 tablet (50 mg total) by mouth every 8 (eight) hours as needed. (Patient not taking: Reported on 07/31/2016) 15 tablet 0   No current facility-administered medications for this visit.     Review of Systems Review of Systems  Constitutional: Negative.   Respiratory: Negative.   Gastrointestinal: Negative.   Genitourinary: Negative.     Blood pressure 122/66, pulse 93, weight 225 lb 11.2 oz (102.4 kg), last menstrual period 10/01/2016.  Physical Exam Physical Exam  Constitutional: She appears well-developed. No distress.  Cardiovascular: Normal rate.   Pulmonary/Chest: Effort normal.  Genitourinary: Vagina normal. No vaginal discharge found.  Genitourinary Comments: No genital lesions    Data Reviewed ED results  Assessment    S/P initial herpes genitalis outbreak, trichomonas Pap was obtained Nexplanon in place    Plan    Replacement of Nexplanon is scheduled Valtrex for suppression  for 6 months       Jeanne Chaney 10/06/2016, 8:25 AM

## 2016-10-09 LAB — CYTOLOGY - PAP: Diagnosis: NEGATIVE

## 2016-11-17 ENCOUNTER — Emergency Department (HOSPITAL_COMMUNITY)
Admission: EM | Admit: 2016-11-17 | Discharge: 2016-11-18 | Disposition: A | Discharge status: Home / Self Care | Payer: Medicaid Other | Attending: Emergency Medicine | Admitting: Emergency Medicine

## 2016-11-17 DIAGNOSIS — B9689 Other specified bacterial agents as the cause of diseases classified elsewhere: Secondary | ICD-10-CM

## 2016-11-17 DIAGNOSIS — A601 Herpesviral infection of perianal skin and rectum: Secondary | ICD-10-CM

## 2016-11-17 DIAGNOSIS — N760 Acute vaginitis: Secondary | ICD-10-CM | POA: Insufficient documentation

## 2016-11-17 DIAGNOSIS — Z79899 Other long term (current) drug therapy: Secondary | ICD-10-CM | POA: Insufficient documentation

## 2016-11-17 NOTE — ED Notes (Signed)
Pt stated that she has a bump on the left side of her perineum that showed up today.

## 2016-11-17 NOTE — ED Provider Notes (Signed)
[facility]-EMER[facility]ENCY DEPT Provider Note   CSN: [id number] Arrival date & time: 11/17/16  2229     History   Chief Complaint Chief Complaint  Patient presents with  . Abscess    HPI Jeanne Chaney is a 26 y.o. female who presents today with chief complaint "sore" around rectum and "bad" vaginal odor. She states she noticed vaginal odor and white vaginal discharge this morning, and this is when she noticed a sore near her rectum. States it is painful to the touch. Denies vaginal itching, bleeding, dysuria, hematuria, abd pain, n/v/d, flank pain. Last menstrual period was 3 weeks ago and lasted 5 days. Denies new sexual partners, does not think she is pregnant. Denies fever/chills, HA, dizziness, CP/SOB, back pain, or joint pain.   The history is provided by the patient.    Past Medical History:  Diagnosis Date  . Yeast infection     Patient Active Problem List   Diagnosis Date Noted  . 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 Pap smear for cervical cancer screening 10/06/2016    No past surgical history on file.  OB History    No data available       Home Medications    Prior to Admission medications   Medication Sig Start Date End Date Taking? Authorizing Provider  acyclovir (ZOVIRAX) 400 MG tablet Take 1 tablet (400 mg total) by mouth 3 (three) times daily. 11/18/16 11/23/16  Dorse Locy A Samya Siciliano, PA-C  Etonogestrel (IMPLANON [place]) Inject 1 application into the skin once.    Historical Provider, MD  ibuprofen (ADVIL,MOTRIN) 600 MG tablet Take 1 tablet (600 mg total) by mouth every 6 (six) hours as needed. Patient not taking: Reported on 10/06/2016 09/17/16   Georgiana Shore, PA-C  metroNIDAZOLE (FLAGYL) 500 MG tablet Take 1 tablet (500 mg total) by mouth 2 (two) times daily. 11/18/16 11/25/16  Ladene Allocca A Payne Garske, PA-C  traMADol (ULTRAM) 50 MG tablet Take 1 tablet (50 mg total) by mouth every 8 (eight) hours as needed. Patient not taking: Reported on 07/31/2016 04/07/16   Elvina Sidle, MD    valACYclovir (VALTREX) 500 MG tablet Take 1 tablet (500 mg total) by mouth daily. 10/06/16   Adam Phenix, MD    Family History No family history on file.  Social History Social History  Substance Use Topics  . Smoking status: Never Smoker  . Smokeless tobacco: Never Used  . Alcohol use No     Allergies   Patient has no known allergies.   Review of Systems Review of Systems  Constitutional: Negative for chills and fever.  Gastrointestinal: Negative for abdominal pain, blood in stool, diarrhea, nausea and vomiting.  Genitourinary: Positive for vaginal discharge. Negative for dysuria, hematuria, vaginal bleeding and vaginal pain.  Musculoskeletal: Negative for arthralgias, back pain, myalgias and neck pain.  Neurological: Negative for dizziness and headaches.     Physical Exam Updated Vital Signs BP 132/77 (BP Location: Left Arm)   Pulse 80   Temp 98.5 F (36.9 C) (Oral)   Resp 14   Ht  (1.702 m)   Wt 99.8 kg   SpO2 98%   BMI 34.46 kg/m   Physical Exam  Constitutional: She is oriented to person, place, and time. She appears well-developed and well-nourished. No distress.  HENT:  Head: Normocephalic and atraumatic.  Eyes: Conjunctivae and EOM are normal. Right eye exhibits no discharge. Left eye exhibits no discharge. No scleral icterus.  Neck: Neck supple. No JVD present. No tracheal deviation present.  No thyromegaly present.  Cardiovascular: Normal rate, regular rhythm, normal heart sounds and intact distal pulses.   Pulmonary/Chest: Effort normal and breath sounds normal.  Abdominal: Soft. Bowel sounds are normal. She exhibits no distension. There is no tenderness. There is no guarding.  Genitourinary:  Genitourinary Comments: Yellow-white malodorous vaginal discharge on exam, no bleeding, cervix appears normal, no vaginal wall masses or abnormalities, no CMT, no adnexal tenderness. There is a 4mm shallow ulceration at the perineum  1 cm below the introitus  that is tender to palpation. No fluctuance, erythema, or evidence of abscess surrounding anus.   Musculoskeletal: Normal range of motion.  Neurological: She is alert and oriented to person, place, and time.  Skin: Skin is warm and dry. Capillary refill takes less than 2 seconds. She is not diaphoretic.  Psychiatric: She has a normal mood and affect. Her behavior is normal. Judgment and thought content normal.     ED Treatments / Results  Labs (all labs ordered are listed, but only abnormal results are displayed) Labs Reviewed  WET PREP, GENITAL - Abnormal; Notable for the following:       Result Value   Clue Cells Wet Prep HPF POC MANY (*)    WBC, Wet Prep HPF POC FEW (*)    All other components within normal limits  URINALYSIS, ROUTINE W REFLEX MICROSCOPIC - Abnormal; Notable for the following:    Hgb urine dipstick SMALL (*)    Leukocytes, UA LARGE (*)    Squamous Epithelial / LPF 0-5 (*)    All other components within normal limits  RPR  HIV ANTIBODY (ROUTINE TESTING)  POC URINE PREG, ED  GC/CHLAMYDIA PROBE AMP ([place]) NOT AT [facility]    EKG  EKG Interpretation None       Radiology No results found.  Procedures Procedures (including critical care time)  Medications Ordered in ED Medications - No data to display   Initial Impression / Assessment and Plan / ED Course  I have reviewed the triage vital signs and the nursing notes.  Pertinent labs & imaging results that were available during my care of the patient were reviewed by me and considered in my medical decision making (see chart for details).     25yof presents to ED with chief complaint vaginal odor and lesion around anus, both of which were noted this morning. Pt afebrile, VSS. Pelvic exam shows white malodorous vaginal discharge and 4mm shallow ulceration below the introitus at the perineum. Negative pregnancy. Low suspicion for PID, ectopic pregnancy, TOA, in absence of pain or abnormal vitals. Wet  prep shows many clue cells and few WBC, GC/chlamydia, HIV, and syphilis collected and sent off for testing. Pt will be notified if abnormal results. UA borderline for UTI, sent for culture; will not give abx in absence of symptoms, but advised pt to seek medical attention if she develops symptoms consistent with UTI. Pt states she had first HSV outbreak in January and was not able to afford her antivirals this month. Presentation consistent with herpes outbreak. Will rx acyclovir  TID x 5 days, follow up with PCP. Wet prep suggestive of BV; rx'd for flagyl x 7 days. Pt will follow up with OBGYN who has been managing her herpes for re-evaluation. Given strict return precautions and pt verbalized understanding and agreement with plan.   Final Clinical Impressions(s) / ED Diagnoses   Final diagnoses:  Herpes simplex infection of perianal skin  BV (bacterial vaginosis)    New Prescriptions Discharge Medication  List as of 11/18/2016  1:18 AM    START taking these medications   Details  acyclovir (ZOVIRAX) 400 MG tablet Take 1 tablet (400 mg total) by mouth 3 (three) times daily., Starting Sat 11/18/2016, Until Thu 11/23/2016, Print    metroNIDAZOLE (FLAGYL) 500 MG tablet Take 1 tablet (500 mg total) by mouth 2 (two) times daily., Starting Sat 11/18/2016, Until Sat 11/25/2016, Print         Jeanie Sewer, PA-C 11/18/16 1125    Derwood Kaplan, MD 11/19/16 1228

## 2016-11-17 NOTE — ED Notes (Signed)
Pt c/o hard raised bump on perianal area. Painful to the touch. Also c/o vaginal odor.

## 2016-11-18 LAB — URINALYSIS, ROUTINE W REFLEX MICROSCOPIC
Bacteria, UA: NONE SEEN
Bilirubin Urine: NEGATIVE
Glucose, UA: NEGATIVE mg/dL
Ketones, ur: NEGATIVE mg/dL
Nitrite: NEGATIVE
Protein, ur: NEGATIVE mg/dL
Specific Gravity, Urine: 1.026 (ref 1.005–1.030)
pH: 6 (ref 5.0–8.0)

## 2016-11-18 LAB — WET PREP, GENITAL
Sperm: NONE SEEN
Trich, Wet Prep: NONE SEEN
Yeast Wet Prep HPF POC: NONE SEEN

## 2016-11-18 LAB — HIV ANTIBODY (ROUTINE TESTING W REFLEX): HIV Screen 4th Generation wRfx: NONREACTIVE

## 2016-11-18 LAB — POC URINE PREG, ED: Preg Test, Ur: NEGATIVE

## 2016-11-18 MED ORDER — ACYCLOVIR 400 MG PO TABS: 400.0000 mg | ORAL_TABLET | Freq: Three times a day (TID) | ORAL | 0 refills | Status: AC

## 2016-11-18 MED ORDER — METRONIDAZOLE 500 MG PO TABS: 500.0000 mg | ORAL_TABLET | Freq: Two times a day (BID) | ORAL | 0 refills | Status: AC

## 2016-11-20 LAB — GC/CHLAMYDIA PROBE AMP (~~LOC~~) NOT AT ARMC
Chlamydia: NEGATIVE
Neisseria Gonorrhea: NEGATIVE

## 2016-11-22 ENCOUNTER — Telehealth: Payer: Self-pay | Admitting: *Deleted

## 2016-11-22 DIAGNOSIS — A6009 Herpesviral infection of other urogenital tract: Secondary | ICD-10-CM

## 2016-11-22 MED ORDER — VALACYCLOVIR HCL 500 MG PO TABS: 500.0000 mg | ORAL_TABLET | Freq: Every day | ORAL | 5 refills | Status: DC

## 2016-11-22 NOTE — Telephone Encounter (Signed)
Called patient & reminded her of previous Rx for valtrex that she has refills on. Patient verbalized understanding and request medication be sent to [facility] as it is too expensive at [facility]. Told patient I will send it there. Patient verbalized understanding & had no questions

## 2016-11-22 NOTE — Telephone Encounter (Signed)
Pt left message yesterday stating that she was recently prescribed medication for Herpes outbreak. Her medication is about to run out and her office appt is not until 4/20.  She wants to know what she should take in the meantime.

## 2016-12-08 ENCOUNTER — Ambulatory Visit (INDEPENDENT_AMBULATORY_CARE_PROVIDER_SITE_OTHER): Payer: Self-pay | Admitting: Obstetrics & Gynecology

## 2016-12-08 ENCOUNTER — Ambulatory Visit (INDEPENDENT_AMBULATORY_CARE_PROVIDER_SITE_OTHER): Payer: Self-pay | Admitting: Clinical

## 2016-12-08 ENCOUNTER — Encounter: Payer: Self-pay | Admitting: Obstetrics & Gynecology

## 2016-12-08 VITALS — Ht 67.0 in | Wt 235.0 lb

## 2016-12-08 DIAGNOSIS — F32A Depression, unspecified: Secondary | ICD-10-CM

## 2016-12-08 DIAGNOSIS — F4323 Adjustment disorder with mixed anxiety and depressed mood: Secondary | ICD-10-CM

## 2016-12-08 DIAGNOSIS — F329 Major depressive disorder, single episode, unspecified: Secondary | ICD-10-CM

## 2016-12-08 DIAGNOSIS — A6009 Herpesviral infection of other urogenital tract: Secondary | ICD-10-CM

## 2016-12-08 MED ORDER — ACYCLOVIR 400 MG PO TABS: 400.0000 mg | ORAL_TABLET | Freq: Two times a day (BID) | ORAL | 5 refills | Status: DC

## 2016-12-08 NOTE — Progress Notes (Signed)
Patient and/or legal guardian verbally consented to meet with Behavioral Health Clinician about presenting concerns.   

## 2016-12-08 NOTE — BH Specialist Note (Signed)
Integrated Behavioral Health Initial Visit  MRN: [id number] Name: Jeanne Chaney   Session Start time: 8:40 Session End time: 9:05 Total time: 20 minutes  Type of Service: Integrated Behavioral Health- Individual/Family Interpretor:No. Interpretor Name and Language: n/a   Warm Hand Off Completed.       SUBJECTIVE: Jeanne Chaney is a 26 y.o. female accompanied by patient. Patient was referred by Dr Debroah Loop for depression. Patient reports the following symptoms/concerns: Pt primary concerns are having difficulty falling asleep, feeling bad about herself, excessive worry, and irritability; copes best when she talks to her father, along with using phone notepad to write, and listening to music at bedtime. Some anxiety concerning local transportation. Pt was treated at [facility] 4 years ago with antidepressant, but uncertain about taking again. Duration of problem: Increase in at least one month; Severity of problem: moderate  OBJECTIVE: Mood: Anxious and Depressed and Affect: Appropriate Risk of harm to self or others: No plan to harm self or others   LIFE CONTEXT: Family and Social: Lives with husband and 2 children(4,almost 7); extended family local and in [place] School/Work: Works fulltime evenings Self-Care: No known substances; sleep difficulty Life Changes: Changing work schedule   GOALS ADDRESSED: Patient will reduce symptoms of: anxiety and depression and increase knowledge and/or ability of: self-management skills and also: Increase healthy adjustment to current life circumstances   INTERVENTIONS: Motivational Interviewing, Mindfulness or Management consultant, Psychoeducation and/or Health Education and Link to [facility]  Standardized Assessments completed: GAD-7 and PHQ 9  ASSESSMENT: Patient currently experiencing Adjustment disorder with mixed anxious and depressed mood. Patient may benefit from Psychoeducation and brief therapeutic intervention regarding coping with  symptoms of anxiety and depression, along with community resources.  PLAN: 1. Follow up with behavioral health clinician on : One month, or as needed 2. Behavioral recommendations:  - Call father every other day, for as long as needed -Consider going outside with children daily, for at least one minute -Consider CALM relaxation breathing exercise OR calming app meditation daily -Consider [facility] the [facility] walk-in clinic, for ongoing therapy and BH med management -Read educational material regarding coping with symptoms of anxiety and depression -Consider community resources(Reduced bus fare ID via [place], and [employer]) 3. Referral(s): Integrated Art gallery manager (In Clinic), Community Mental Health Services (LME/Outside Clinic) and [employer] Resources:  Transportation and [employer] 4. "From scale of 1-10, how likely are you to follow plan?": 8  [name], LCSWA   Depression screen [facility] 2/9 12/08/2016 04/14/2016 04/07/2016  Decreased Interest 2 0 0  Down, Depressed, Hopeless 2 0 0  PHQ - 2 Score 4 0 0  Altered sleeping 3 - -  Tired, decreased energy 2 - -  Change in appetite 2 - -  Feeling bad or failure about yourself  3 - -  Trouble concentrating 0 - -  Moving slowly or fidgety/restless 2 - -  Suicidal thoughts 0 - -  PHQ-9 Score 16 - -   GAD 7 : Generalized Anxiety Score 12/08/2016  Nervous, Anxious, on Edge 0  Control/stop worrying 3  Worry too much - different things 3  Trouble relaxing 2  Restless 2  Easily annoyed or irritable 3  Afraid - awful might happen 1  Total GAD 7 Score 14

## 2016-12-08 NOTE — Progress Notes (Signed)
Subjective:     Patient ID: Jeanne Chaney, female   DOB: 10-29-1990, 26 y.o.   MRN: [id number] Cc: ED f/u after HSV outbreak 11/17/16 HPI  [id number] Patient's last menstrual period was 10/29/2016 (exact date). She stopped her Valtrex due to high price and has outbreak which has resolved. She would like lower cost option  Current Outpatient Prescriptions on File Prior to Visit  Medication Sig Dispense Refill  . Etonogestrel (IMPLANON [place]) Inject 1 application into the skin once.    Marland Kitchen ibuprofen (ADVIL,MOTRIN) 600 MG tablet Take 1 tablet (600 mg total) by mouth every 6 (six) hours as needed. (Patient not taking: Reported on 10/06/2016) 30 tablet 0  . traMADol (ULTRAM) 50 MG tablet Take 1 tablet (50 mg total) by mouth every 8 (eight) hours as needed. (Patient not taking: Reported on 07/31/2016) 15 tablet 0   No current facility-administered medications on file prior to visit.    No Known Allergies    Review of Systems  Constitutional: Negative.   Gastrointestinal: Negative.   Genitourinary: Negative for menstrual problem, vaginal bleeding, vaginal discharge and vaginal pain.       Objective:   Physical Exam  Constitutional: She is oriented to person, place, and time. She appears well-developed. No distress.  Genitourinary:  Genitourinary Comments: deferred  Neurological: She is alert and oriented to person, place, and time.  Psychiatric: She has a normal mood and affect. Her behavior is normal.       Assessment:     HSV suppression will change to acyclovir    Plan:     Outpatient Encounter Prescriptions as of 12/08/2016  Medication Sig Note  . Etonogestrel (IMPLANON ) Inject 1 application into the skin once. 10/13/2015: Due to change on 11/03/15  . [DISCONTINUED] valACYclovir (VALTREX) 500 MG tablet Take 1 tablet (500 mg total) by mouth daily.   Marland Kitchen acyclovir (ZOVIRAX) 400 MG tablet Take 1 tablet (400 mg total) by mouth 2 (two) times daily.   Marland Kitchen ibuprofen (ADVIL,MOTRIN) 600 MG  tablet Take 1 tablet (600 mg total) by mouth every 6 (six) hours as needed. (Patient not taking: Reported on 10/06/2016)   . traMADol (ULTRAM) 50 MG tablet Take 1 tablet (50 mg total) by mouth every 8 (eight) hours as needed. (Patient not taking: Reported on 07/31/2016)    No facility-administered encounter medications on file as of 12/08/2016.    RTC prn  Adam Phenix, MD 12/08/2016

## 2016-12-08 NOTE — Patient Instructions (Signed)
Genital Herpes Genital herpes is a common sexually transmitted infection (STI) that is caused by a virus. The virus spreads from person to person through sexual contact. Infection can cause itching, blisters, and sores around the genitals or rectum. Symptoms may last several days and then go away This is called an outbreak. However, the virus remains in your body, so you may have more outbreaks in the future. The time between outbreaks varies and can be months or years. Genital herpes affects men and women. It is particularly concerning for pregnant women because the virus can be passed to the baby during delivery and can cause serious problems. Genital herpes is also a concern for people who have a weak disease-fighting (immune) system. What are the causes? This condition is caused by the herpes simplex virus (HSV) type 1 or type 2. The virus may spread through:  Sexual contact with an infected person, including vaginal, anal, and oral sex.  Contact with fluid from a herpes sore.  The skin. This means that you can get herpes from an infected partner even if he or she does not have a visible sore or does not know that he or she is infected. What increases the risk? You are more likely to develop this condition if:  You have sex with many partners.  You do not use latex condoms during sex. What are the signs or symptoms? Most people do not have symptoms (asymptomatic) or have mild symptoms that may be mistaken for other skin problems. Symptoms may include:  Small red bumps near the genitals, rectum, or mouth. These bumps turn into blisters and then turn into sores.  Flu-like symptoms, including:  Fever.  Body aches.  Swollen lymph nodes.  Headache.  Painful urination.  Pain and itching in the genital area or rectal area.  Vaginal discharge.  Tingling or shooting pain in the legs and buttocks. Generally, symptoms are more severe and last longer during the first (primary)  outbreak. Flu-like symptoms are also more common during the primary outbreak. How is this diagnosed? Genital herpes may be diagnosed based on:  A physical exam.  Your medical history.  Blood tests.  A test of a fluid sample (culture) from an open sore. How is this treated? There is no cure for this condition, but treatment with antiviral medicines that are taken by mouth (orally) can do the following:  Speed up healing and relieve symptoms.  Help to reduce the spread of the virus to sexual partners.  Limit the chance of future outbreaks, or make future outbreaks shorter.  Lessen symptoms of future outbreaks. Your health care provider may also recommend pain relief medicines, such as aspirin or ibuprofen. Follow these instructions at home: Sexual activity   Do not have sexual contact during active outbreaks.  Practice safe sex. Latex condoms and female condoms may help prevent the spread of the herpes virus. General instructions   Keep the affected areas dry and clean.  Take over-the-counter and prescription medicines only as told by your health care provider.  Avoid rubbing or touching blisters and sores. If you do touch blisters or sores:  Wash your hands thoroughly with soap and water.  Do not touch your eyes afterward.  To help relieve pain or itching, you may take the following actions as directed by your health care provider:  Apply a cold, wet cloth (cold compress) to affected areas 4-6 times a day.  Apply a substance that protects your skin and reduces bleeding (astringent).  Apply a   gel that helps relieve pain around sores (lidocaine gel).  Take a warm, shallow bath that cleans the genital area (sitz bath).  Keep all follow-up visits as told by your health care provider. This is important. How is this prevented?  Use condoms. Although anyone can get genital herpes during sexual contact, even with the use of a condom, a condom can provide some  protection.  Avoid having multiple sexual partners.  Talk with your sexual partner about any symptoms either of you may have. Also, talk with your partner about any history of STIs.  Get tested for STIs before you have sex. Ask your partner to do the same.  Do not have sexual contact if you have symptoms of genital herpes. Contact a health care provider if:  Your symptoms are not improving with medicine.  Your symptoms return.  You have new symptoms.  You have a fever.  You have abdominal pain.  You have redness, swelling, or pain in your eye.  You notice new sores on other parts of your body.  You are a woman and experience bleeding between menstrual periods.  You have had herpes and you become pregnant or plan to become pregnant. Summary  Genital herpes is a common sexually transmitted infection (STI) that is caused by the herpes simplex virus (HSV) type 1 or type 2.  These viruses are most often spread through sexual contact with an infected person.  You are more likely to develop this condition if you have sex with many partners or you have unprotected sex.  Most people do not have symptoms (asymptomatic) or have mild symptoms that may be mistaken for other skin problems. Symptoms occur as outbreaks that may happen months or years apart.  There is no cure for this condition, but treatment with oral antiviral medicines can reduce symptoms, reduce the chance of spreading the virus to a partner, prevent future outbreaks, or shorten future outbreaks. This information is not intended to replace advice given to you by your health care provider. Make sure you discuss any questions you have with your health care provider. Document Released: 08/04/2000 Document Revised: 07/07/2016 Document Reviewed: 07/07/2016 Elsevier Interactive Patient Education  2017 [employer]  

## 2017-02-23 ENCOUNTER — Emergency Department (HOSPITAL_COMMUNITY)
Admission: EM | Admit: 2017-02-23 | Discharge: 2017-02-23 | Disposition: A | Discharge status: Home / Self Care | Payer: Medicaid Other | Attending: Emergency Medicine | Admitting: Emergency Medicine

## 2017-02-23 ENCOUNTER — Encounter (HOSPITAL_COMMUNITY): Payer: Self-pay

## 2017-02-23 DIAGNOSIS — B9689 Other specified bacterial agents as the cause of diseases classified elsewhere: Secondary | ICD-10-CM

## 2017-02-23 DIAGNOSIS — N760 Acute vaginitis: Secondary | ICD-10-CM | POA: Insufficient documentation

## 2017-02-23 DIAGNOSIS — Z79899 Other long term (current) drug therapy: Secondary | ICD-10-CM | POA: Insufficient documentation

## 2017-02-23 DIAGNOSIS — A5900 Urogenital trichomoniasis, unspecified: Secondary | ICD-10-CM | POA: Insufficient documentation

## 2017-02-23 DIAGNOSIS — A599 Trichomoniasis, unspecified: Secondary | ICD-10-CM

## 2017-02-23 LAB — WET PREP, GENITAL
Sperm: NONE SEEN
Yeast Wet Prep HPF POC: NONE SEEN

## 2017-02-23 MED ORDER — METRONIDAZOLE 500 MG PO TABS: 500.0000 mg | ORAL_TABLET | Freq: Two times a day (BID) | ORAL | 0 refills | Status: DC

## 2017-02-23 NOTE — ED Triage Notes (Signed)
Pt complains of vaginal itching for two days, no odor, no discharge and no burning

## 2017-02-23 NOTE — ED Provider Notes (Signed)
[facility]-EMERGENCY DEPT Provider Note   CSN: [id number] Arrival date & time: 02/23/17  [id number]     History   Chief Complaint Chief Complaint  Patient presents with  . Vaginal Itching    HPI Jeanne Chaney is a 26 y.o. female.  HPI   Pt p/w vaginal itching x 2 days, slight yellow discharge.  Denies fevers, N/V, abdominal pain, vaginal bleeding.  She recently switched genital wash scents.  Is sexually active with one partner, last time was last week.  She has low suspicion for STDs.  Does not have periods due to implanted birth control placed in March of this year.    Past Medical History:  Diagnosis Date  . Yeast infection     Patient Active Problem List   Diagnosis Date Noted  . 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 Pap smear for cervical cancer screening 10/06/2016    History reviewed. No pertinent surgical history.  OB History    Gravida Para Term Preterm AB Living   2 2 2     2    SAB TAB Ectopic Multiple Live Births           2       Home Medications    Prior to Admission medications   Medication Sig Start Date End Date Taking? Authorizing Provider  acyclovir (ZOVIRAX) 400 MG tablet Take 1 tablet (400 mg total) by mouth 2 (two) times daily. 12/08/16   Adam PhenixArnold, James G, MD  Etonogestrel [facility](IMPLANON [place]) Inject 1 application into the skin once.    [provider]  ibuprofen (ADVIL,MOTRIN) 600 MG tablet Take 1 tablet (600 mg total) by mouth every 6 (six) hours as needed. Patient not taking: Reported on 10/06/2016 09/17/16   Mathews RobinsonsMitchell, Jessica B, PA-C  metroNIDAZOLE (FLAGYL) 500 MG tablet Take 1 tablet (500 mg total) by mouth 2 (two) times daily. One po bid x 7 days 02/23/17   ChadWest, Sharief Wainwright, PA-C  traMADol (ULTRAM) 50 MG tablet Take 1 tablet (50 mg total) by mouth every 8 (eight) hours as needed. Patient not taking: Reported on 07/31/2016 04/07/16   Elvina SidleLauenstein, Kurt, MD    Family History History reviewed. No pertinent family history.  Social History Social History    Substance Use Topics  . Smoking status: Never Smoker  . Smokeless tobacco: Never Used  . Alcohol use No     Allergies   Patient has no known allergies.   Review of Systems Review of Systems  All other systems reviewed and are negative.    Physical Exam Updated Vital Signs BP 128/81 (BP Location: Left Arm)   Pulse 83   Temp 97.8 F (36.6 C) (Oral)   Resp 16   SpO2 100%   Physical Exam  Constitutional: She appears well-developed and well-nourished. No distress.  HENT:  Head: Normocephalic and atraumatic.  Neck: Neck supple.  Pulmonary/Chest: Effort normal.  Abdominal: Soft. She exhibits no distension. There is no tenderness. There is no rebound and no guarding.  Genitourinary: Uterus is not tender. Cervix exhibits no motion tenderness. Right adnexum displays no mass, no tenderness and no fullness. Left adnexum displays no mass, no tenderness and no fullness.  Genitourinary Comments: Vagina without tenderness.  Small amount of thick white discharge.  No bleeding.  No cervical tenderness.    Neurological: She is alert.  Skin: She is not diaphoretic.  Nursing note and vitals reviewed.    ED Treatments / Results  Labs (all labs ordered are listed, but only abnormal results are  displayed) Labs Reviewed  WET PREP, GENITAL - Abnormal; Notable for the following:       Result Value   Trich, Wet Prep PRESENT (*)    Clue Cells Wet Prep HPF POC PRESENT (*)    WBC, Wet Prep HPF POC MANY (*)    All other components within normal limits  RPR  HIV ANTIBODY (ROUTINE TESTING)  GC/CHLAMYDIA PROBE AMP ([place]) NOT AT [facility]    EKG  EKG Interpretation None       Radiology No results found.  Procedures Procedures (including critical care time)  Medications Ordered in ED Medications - No data to display   Initial Impression / Assessment and Plan / ED Course  I have reviewed the triage vital signs and the nursing notes.  Pertinent labs & imaging results that  were available during my care of the patient were reviewed by me and considered in my medical decision making (see chart for details).     Afebrile, nontoxic patient with vaginal itching.  She has implanted birth control.  No vaginal bleeding.  No abdominal pain or cervical tenderness.  Wet prep demonstrates trich and clue cells.  Will treat with PO flagyl x 1 week.  Pt advised sexual partner must also be tested and treated.  HIV,RPR,GC/Chlam pending.    D/C home with health department information/ STI information.  Discussed result, findings, treatment, and follow up  with patient.  Pt given return precautions.  Pt verbalizes understanding and agrees with plan.       Final Clinical Impressions(s) / ED Diagnoses   Final diagnoses:  BV (bacterial vaginosis)  Trichomonas vaginalis infection    New Prescriptions Discharge Medication List as of 02/23/2017  8:25 AM    START taking these medications   Details  metroNIDAZOLE (FLAGYL) 500 MG tablet Take 1 tablet (500 mg total) by mouth 2 (two) times daily. One po bid x 7 days, Starting Fri 02/23/2017, Print         [place], [place], [place] 02/23/17 1610    Geoffery Lyons, MD 02/27/17 [phone]

## 2017-02-23 NOTE — Discharge Instructions (Signed)
Read the information below.  Use the prescribed medication as directed.  Please discuss all new medications with your pharmacist.  You may return to the Emergency Department at any time for worsening condition or any new symptoms that concern you.     You have a bacterial vaginal infection as well as a sexually transmitted infection called Trichomonas.  Your sexual partner also needs to be tested and treated to avoid reinfection.  Please do not drink alcohol while taking the antibiotic as it will make you very sick.    If you develop high fevers, abdominal pain, uncontrolled vomiting, or are unable to tolerate fluids by mouth, return to the ER for a recheck.

## 2017-02-24 LAB — RPR: RPR: NONREACTIVE

## 2017-02-24 LAB — HIV ANTIBODY (ROUTINE TESTING W REFLEX): HIV SCREEN 4TH GENERATION: NONREACTIVE

## 2017-02-26 LAB — GC/CHLAMYDIA PROBE AMP (~~LOC~~) NOT AT ARMC
CHLAMYDIA, DNA PROBE: NEGATIVE
NEISSERIA GONORRHEA: NEGATIVE

## 2017-03-06 ENCOUNTER — Emergency Department (HOSPITAL_COMMUNITY)
Admission: EM | Admit: 2017-03-06 | Discharge: 2017-03-06 | Disposition: A | Discharge status: Home / Self Care | Payer: Medicaid Other | Attending: Emergency Medicine | Admitting: Emergency Medicine

## 2017-03-06 ENCOUNTER — Encounter (HOSPITAL_COMMUNITY): Payer: Self-pay

## 2017-03-06 DIAGNOSIS — B373 Candidiasis of vulva and vagina: Secondary | ICD-10-CM

## 2017-03-06 DIAGNOSIS — Z79899 Other long term (current) drug therapy: Secondary | ICD-10-CM | POA: Insufficient documentation

## 2017-03-06 DIAGNOSIS — B3731 Acute candidiasis of vulva and vagina: Secondary | ICD-10-CM

## 2017-03-06 LAB — URINALYSIS, ROUTINE W REFLEX MICROSCOPIC
Bacteria, UA: NONE SEEN
Bilirubin Urine: NEGATIVE
Glucose, UA: NEGATIVE mg/dL
Ketones, ur: NEGATIVE mg/dL
NITRITE: NEGATIVE
PH: 5 (ref 5.0–8.0)
Protein, ur: NEGATIVE mg/dL
SPECIFIC GRAVITY, URINE: 1.018 (ref 1.005–1.030)

## 2017-03-06 LAB — WET PREP, GENITAL
Sperm: NONE SEEN
TRICH WET PREP: NONE SEEN

## 2017-03-06 MED ORDER — FLUCONAZOLE 150 MG PO TABS
150.0000 mg | ORAL_TABLET | Freq: Once | ORAL | Status: AC
  Administered 2017-03-06: 150 mg via ORAL
  Filled 2017-03-06: qty 1

## 2017-03-06 NOTE — Discharge Instructions (Signed)
Please read instructions below. Drink plenty of water. You can continue using vagisil if that provides you with relief. Schedule an appointment with the Women's clinic to establish gynecologic care. Return to the ER for new or concerning symptoms.

## 2017-03-06 NOTE — ED Triage Notes (Signed)
Pt was seen and treated last week for BV and trich, she was given medications and as soon as they were completed she started to have a discharge again No complaints of odor, burning or bleeding

## 2017-03-06 NOTE — ED Provider Notes (Signed)
[facility] DEPT Provider Note   CSN: [id number] Arrival date & time: 03/06/17  [id number]     History   Chief Complaint Chief Complaint  Patient presents with  . Vaginal Pain    HPI Jeanne Chaney is a 26 y.o. female.  HPI  Patient presents with intermittent vaginal itching since Friday. Patient was seen on 02/23/2017 for similar complaint and treated for BV and Trichomonas with Flagyl x7d. Patient states her symptoms improved until she finished the antibiotic, when the itching began again. Patient states she only feels the itching when she is urinating. States it is improved with topical vagisil. She reports associated mild vaginal discharge, that has improved since previous visit, as well as slightly increased urinary frequency. She denies vaginal bleeding, dysuria, fever, nausea, vomiting, flank pain, abdominal pain.  Past Medical History:  Diagnosis Date  . Yeast infection     Patient Active Problem List   Diagnosis Date Noted  . 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 Pap smear for cervical cancer screening 10/06/2016    History reviewed. No pertinent surgical history.  OB History    Gravida Para Term Preterm AB Living   2 2 2     2    SAB TAB Ectopic Multiple Live Births           2       Home Medications    Prior to Admission medications   Medication Sig Start Date End Date Taking? Authorizing Provider  acyclovir (ZOVIRAX) 400 MG tablet Take 1 tablet (400 mg total) by mouth 2 (two) times daily. 12/08/16  Yes Adam PhenixArnold, James G, MD  aspirin-acetaminophen-caffeine (EXCEDRIN MIGRAINE) [phone] MG tablet Take 2 tablets by mouth every 6 (six) hours as needed for headache or migraine.   Yes [provider]  Etonogestrel (IMPLANON [place]) Inject 1 application into the skin once.   Yes [provider]  ibuprofen (ADVIL,MOTRIN) 600 MG tablet Take 1 tablet (600 mg total) by mouth every 6 (six) hours as needed. Patient not taking: Reported on 10/06/2016 09/17/16    Mathews RobinsonsMitchell, Jessica B, PA-C  metroNIDAZOLE (FLAGYL) 500 MG tablet Take 1 tablet (500 mg total) by mouth 2 (two) times daily. One po bid x 7 days Patient not taking: Reported on 03/06/2017 02/23/17   Trixie DredgeWest, Emily, PA-C    Family History History reviewed. No pertinent family history.  Social History Social History  Substance Use Topics  . Smoking status: Never Smoker  . Smokeless tobacco: Never Used  . Alcohol use No     Allergies   Patient has no known allergies.   Review of Systems Review of Systems  Constitutional: Negative for fever.  Gastrointestinal: Negative for abdominal pain, nausea and vomiting.  Genitourinary: Positive for frequency and vaginal discharge. Negative for dysuria, flank pain, vaginal bleeding and vaginal pain.     Physical Exam Updated Vital Signs BP 126/76 (BP Location: Left Arm)   Pulse 76   Temp 98.2 F (36.8 C) (Oral)   Resp 16   SpO2 100%   Physical Exam  Constitutional: She appears well-developed and well-nourished.  HENT:  Head: Normocephalic and atraumatic.  Eyes: Conjunctivae are normal.  Cardiovascular: Normal rate, regular rhythm, normal heart sounds and intact distal pulses.   Pulmonary/Chest: Effort normal and breath sounds normal. No respiratory distress.  Abdominal: Soft. Bowel sounds are normal. She exhibits no distension and no mass. There is no tenderness. There is no rebound and no guarding.  Genitourinary: Uterus normal. There is no rash or tenderness  on the right labia. There is no rash or tenderness on the left labia. Cervix exhibits no motion tenderness and no friability. Right adnexum displays no mass, no tenderness and no fullness. Left adnexum displays no mass, no tenderness and no fullness. There is erythema in the vagina. Vaginal discharge (thick white discharge) found.  Genitourinary Comments: Exam performed with chaperone present.  Psychiatric: She has a normal mood and affect. Her behavior is normal.  Nursing note and  vitals reviewed.    ED Treatments / Results  Labs (all labs ordered are listed, but only abnormal results are displayed) Labs Reviewed  WET PREP, GENITAL - Abnormal; Notable for the following:       Result Value   Yeast Wet Prep HPF POC PRESENT (*)    Clue Cells Wet Prep HPF POC PRESENT (*)    WBC, Wet Prep HPF POC MANY (*)    All other components within normal limits  URINALYSIS, ROUTINE W REFLEX MICROSCOPIC - Abnormal; Notable for the following:    Hgb urine dipstick SMALL (*)    Leukocytes, UA LARGE (*)    Squamous Epithelial / LPF 0-5 (*)    All other components within normal limits    EKG  EKG Interpretation None       Radiology No results found.  Procedures Procedures (including critical care time)  Medications Ordered in ED Medications  fluconazole (DIFLUCAN) tablet 150 mg (not administered)     Initial Impression / Assessment and Plan / ED Course  I have reviewed the triage vital signs and the nursing notes.  Pertinent labs & imaging results that were available during my care of the patient were reviewed by me and considered in my medical decision making (see chart for details).     She presenting with vaginal itching. Exam with thick white discharge and vaginal erythema, no CMT. Patient is afebrile, nontoxic, not in distress. GC/Chlamydia, HIV, RPR from previous visit resulted negative.  Wet prep today with yeast and WBC present. U/A without evidence of UTI. Candidiasis treated with fluconazole. Women's clinic referral given to establish care. Pt is safe for discharge.  Patient discussed with and seen by Dr. Adriana Simas.  Discussed results, findings, treatment and follow up. Patient advised of return precautions. Patient verbalized understanding and agreed with plan.  Final Clinical Impressions(s) / ED Diagnoses   Final diagnoses:  Vaginal candidiasis    New Prescriptions New Prescriptions   No medications on file     [place], [place] N, PA-C 03/06/17  4782    Donnetta Hutching, MD 03/07/17 1312

## 2017-04-04 ENCOUNTER — Encounter (HOSPITAL_COMMUNITY): Payer: Self-pay

## 2017-04-04 ENCOUNTER — Emergency Department (HOSPITAL_COMMUNITY)
Admission: EM | Admit: 2017-04-04 | Discharge: 2017-04-04 | Disposition: A | Discharge status: Home / Self Care | Payer: Medicaid Other | Attending: Emergency Medicine | Admitting: Emergency Medicine

## 2017-04-04 DIAGNOSIS — B3731 Acute candidiasis of vulva and vagina: Secondary | ICD-10-CM

## 2017-04-04 DIAGNOSIS — Z79899 Other long term (current) drug therapy: Secondary | ICD-10-CM | POA: Insufficient documentation

## 2017-04-04 DIAGNOSIS — Z7982 Long term (current) use of aspirin: Secondary | ICD-10-CM | POA: Insufficient documentation

## 2017-04-04 DIAGNOSIS — B373 Candidiasis of vulva and vagina: Secondary | ICD-10-CM

## 2017-04-04 LAB — BASIC METABOLIC PANEL
Anion gap: 7 (ref 5–15)
BUN: 9 mg/dL (ref 6–20)
CHLORIDE: 106 mmol/L (ref 101–111)
CO2: 26 mmol/L (ref 22–32)
Calcium: 9.2 mg/dL (ref 8.9–10.3)
Creatinine, Ser: 0.63 mg/dL (ref 0.44–1.00)
Glucose, Bld: 108 mg/dL — ABNORMAL HIGH (ref 65–99)
POTASSIUM: 3.8 mmol/L (ref 3.5–5.1)
SODIUM: 139 mmol/L (ref 135–145)

## 2017-04-04 LAB — URINALYSIS, ROUTINE W REFLEX MICROSCOPIC
Bilirubin Urine: NEGATIVE
Glucose, UA: NEGATIVE mg/dL
KETONES UR: NEGATIVE mg/dL
Nitrite: NEGATIVE
PH: 7 (ref 5.0–8.0)
PROTEIN: NEGATIVE mg/dL
Specific Gravity, Urine: 1.017 (ref 1.005–1.030)

## 2017-04-04 LAB — WET PREP, GENITAL
Clue Cells Wet Prep HPF POC: NONE SEEN
Sperm: NONE SEEN
Trich, Wet Prep: NONE SEEN

## 2017-04-04 LAB — CBC
HEMATOCRIT: 36.2 % (ref 36.0–46.0)
HEMOGLOBIN: 11.9 g/dL — AB (ref 12.0–15.0)
MCH: 26.2 pg (ref 26.0–34.0)
MCHC: 32.9 g/dL (ref 30.0–36.0)
MCV: 79.6 fL (ref 78.0–100.0)
Platelets: 259 10*3/uL (ref 150–400)
RBC: 4.55 MIL/uL (ref 3.87–5.11)
RDW: 14.6 % (ref 11.5–15.5)
WBC: 4.9 10*3/uL (ref 4.0–10.5)

## 2017-04-04 MED ORDER — FLUCONAZOLE 150 MG PO TABS
150.0000 mg | ORAL_TABLET | Freq: Once | ORAL | Status: AC
  Administered 2017-04-04: 150 mg via ORAL
  Filled 2017-04-04: qty 1

## 2017-04-04 MED ORDER — ACETAMINOPHEN 325 MG PO TABS
650.0000 mg | ORAL_TABLET | Freq: Once | ORAL | Status: AC
  Administered 2017-04-04: 650 mg via ORAL
  Filled 2017-04-04: qty 2

## 2017-04-04 MED ORDER — FLUCONAZOLE 150 MG PO TABS: 150.0000 mg | ORAL_TABLET | Freq: Once | ORAL | 0 refills | Status: AC

## 2017-04-04 NOTE — ED Provider Notes (Signed)
[facility]ER[facility] DEPT Provider Note   CSN: [id number] Arrival date & time: 04/04/17  4098     History   Chief Complaint Chief Complaint  Patient presents with  . Dysuria    HPI Jeanne Chaney is a 26 y.o. female.  HPI  CC: urethral pain  Onset/Duration: 2 days Timing: constant Location: external urethra Quality: aching Severity: moderate Modifying Factors:  Improved by: nothing  Worsened by: nothing Associated Signs/Symptoms:  Pertinent (+): white vaginal discharge  Pertinent (-): fevers, chills, abd pain, vaginal bleeding.  Context: had recent yeast infection treated with diclofenac last month. Last sexual encounter was last week, protected. Endorses prior STI (Trich) last month; treated. Has HSV.  LMP: Feb 2018 on [place].  Past Medical History:  Diagnosis Date  . Yeast infection     Patient Active Problem List   Diagnosis Date Noted  . 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 Pap smear for cervical cancer screening 10/06/2016    History reviewed. No pertinent surgical history.  OB History    Gravida Para Term Preterm AB Living   2 2 2     2    SAB TAB Ectopic Multiple Live Births           2       Home Medications    Prior to Admission medications   Medication Sig Start Date End Date Taking? Authorizing Provider  acyclovir (ZOVIRAX) 400 MG tablet Take 1 tablet (400 mg total) by mouth 2 (two) times daily. 12/08/16  Yes Adam Phenix, MD  aspirin-acetaminophen-caffeine (EXCEDRIN MIGRAINE) [phone] MG tablet Take 2 tablets by mouth every 6 (six) hours as needed for headache or migraine.   Yes [provider]  Etonogestrel (IMPLANON [place]) Inject 1 application into the skin once.   Yes [provider]  fluconazole (DIFLUCAN) 150 MG tablet Take 1 tablet (150 mg total) by mouth once. Take in 3 days if symptoms persist 04/04/17 04/04/17  Cardama, Amadeo Garnet, MD  ibuprofen (ADVIL,MOTRIN) 600 MG tablet Take 1 tablet (600 mg total) by mouth  every 6 (six) hours as needed. Patient not taking: Reported on 10/06/2016 09/17/16   Mathews Robinsons B, PA-C  metroNIDAZOLE (FLAGYL) 500 MG tablet Take 1 tablet (500 mg total) by mouth 2 (two) times daily. One po bid x 7 days Patient not taking: Reported on 03/06/2017 02/23/17   Trixie Dredge, PA-C    Family History History reviewed. No pertinent family history.  Social History Social History  Substance Use Topics  . Smoking status: Never Smoker  . Smokeless tobacco: Never Used  . Alcohol use No     Allergies   Patient has no known allergies.   Review of Systems Review of Systems All other systems are reviewed and are negative for acute change except as noted in the HPI   Physical Exam Updated Vital Signs BP (!) 145/83 (BP Location: Left Arm)   Pulse 91   Temp 99 F (37.2 C) (Oral)   Resp 18   SpO2 100%   Physical Exam  Constitutional: She is oriented to person, place, and time. She appears well-developed and well-nourished. No distress.  HENT:  Head: Normocephalic and atraumatic.  Right Ear: External ear normal.  Left Ear: External ear normal.  Nose: Nose normal.  Eyes: Conjunctivae and EOM are normal. No scleral icterus.  Neck: Normal range of motion and phonation normal.  Cardiovascular: Normal rate and regular rhythm.   Pulmonary/Chest: Effort normal. No stridor. No respiratory distress.  Abdominal: She exhibits  no distension.  Genitourinary: Pelvic exam was performed with patient supine. There is no tenderness or lesion on the right labia. There is no tenderness or lesion on the left labia. Uterus is not tender. Cervix exhibits no motion tenderness, no discharge and no friability. Right adnexum displays no tenderness. Left adnexum displays no tenderness. No erythema, tenderness or bleeding in the vagina. No foreign body in the vagina. Vaginal discharge (awake and white) found.  Genitourinary Comments: Chaperone present during pelvic exam.  Mild irritation to the  tissues of the external urethra. No vesicular lesions noted.  Musculoskeletal: Normal range of motion. She exhibits no edema.  Neurological: She is alert and oriented to person, place, and time.  Skin: She is not diaphoretic.  Psychiatric: She has a normal mood and affect. Her behavior is normal.  Vitals reviewed.    ED Treatments / Results  Labs (all labs ordered are listed, but only abnormal results are displayed) Labs Reviewed  WET PREP, GENITAL - Abnormal; Notable for the following:       Result Value   Yeast Wet Prep HPF POC PRESENT (*)    WBC, Wet Prep HPF POC MANY (*)    All other components within normal limits  URINALYSIS, ROUTINE W REFLEX MICROSCOPIC - Abnormal; Notable for the following:    APPearance HAZY (*)    Hgb urine dipstick SMALL (*)    Leukocytes, UA LARGE (*)    Bacteria, UA RARE (*)    Squamous Epithelial / LPF 0-5 (*)    All other components within normal limits  CBC - Abnormal; Notable for the following:    Hemoglobin 11.9 (*)    All other components within normal limits  BASIC METABOLIC PANEL - Abnormal; Notable for the following:    Glucose, Bld 108 (*)    All other components within normal limits  GC/CHLAMYDIA PROBE AMP ([place]) NOT AT [facility]    EKG  EKG Interpretation None       Radiology No results found.  Procedures Procedures (including critical care time)  Medications Ordered in ED Medications  fluconazole (DIFLUCAN) tablet 150 mg (not administered)  acetaminophen (TYLENOL) tablet 650 mg (650 mg Oral Given 04/04/17 1002)     Initial Impression / Assessment and Plan / ED Course  I have reviewed the triage vital signs and the nursing notes.  Pertinent labs & imaging results that were available during my care of the patient were reviewed by me and considered in my medical decision making (see chart for details).     No vesicular lesions concerning for herpes outbreak. Exam not concerning for cervicitis due to  gonorrhea/chlamydia and no evidence of PID. More concerning for yeast vaginitis. Wet prep without evidence of Trichomonas. Did note yeast cells.  Treated with oral deck fluke can. We'll prescribe a single dose for repeat in 3 days if needed.  The patient is safe for discharge with strict return precautions.     Final Clinical Impressions(s) / ED Diagnoses   Final diagnoses:  Yeast infection involving the vagina and surrounding area   Disposition: Discharge  Condition: Good  I have discussed the results, Dx and Tx plan with the patient who expressed understanding and agree(s) with the plan. Discharge instructions discussed at great length. The patient was given strict return precautions who verbalized understanding of the instructions. No further questions at time of discharge.    New Prescriptions   FLUCONAZOLE (DIFLUCAN) 150 MG TABLET    Take 1 tablet (150 mg total)  by mouth once. Take in 3 days if symptoms persist    Follow Up: Primary care provider         Nira Connardama, Pedro Eduardo, MD 04/04/17 1039

## 2017-04-04 NOTE — Progress Notes (Signed)
CM noted pt to have 4 ED visits in the last 6 months with no PCP and no ins.  Noted visits are all OB-GYN related.  Asked pt if she had an OB-GYN and she stated she has an appointment with them tomorrow but the pain was too great today.  Asked pt if she had a PCP and if not if she was interested in establishing a PCP. Discussed the 3 clinic options with her and [facility]  [facility]  Updated pt on appointment time and placed on AVS at time of D/C.  No further CM needs noted at this time.

## 2017-04-04 NOTE — ED Triage Notes (Signed)
Pt complains of burning when she urinates, and she states that she saw white discharge this morning

## 2017-04-04 NOTE — ED Notes (Signed)
Report given to oncoming RN.

## 2017-04-05 ENCOUNTER — Ambulatory Visit (INDEPENDENT_AMBULATORY_CARE_PROVIDER_SITE_OTHER): Payer: Self-pay | Admitting: Family Medicine

## 2017-04-05 VITALS — BP 127/83 | HR 76 | Wt 230.0 lb

## 2017-04-05 DIAGNOSIS — B3731 Acute candidiasis of vulva and vagina: Secondary | ICD-10-CM

## 2017-04-05 DIAGNOSIS — Z113 Encounter for screening for infections with a predominantly sexual mode of transmission: Secondary | ICD-10-CM

## 2017-04-05 DIAGNOSIS — N898 Other specified noninflammatory disorders of vagina: Secondary | ICD-10-CM

## 2017-04-05 DIAGNOSIS — B373 Candidiasis of vulva and vagina: Secondary | ICD-10-CM

## 2017-04-05 LAB — GC/CHLAMYDIA PROBE AMP (~~LOC~~) NOT AT ARMC
CHLAMYDIA, DNA PROBE: NEGATIVE
Neisseria Gonorrhea: NEGATIVE

## 2017-04-05 MED ORDER — FLUCONAZOLE 150 MG PO TABS: 150.0000 mg | ORAL_TABLET | Freq: Once | ORAL | 0 refills | Status: AC

## 2017-04-05 NOTE — Progress Notes (Signed)
   Subjective:    Patient ID: Jeanne Chaney, female    DOB: [birth year], 26 y.o.   MRN: [id number]  HPI Patient seen for recurrent yeast infection. Patient had these infection on 7/17 and was given medication for yeast and BV. She was fine until yesterday when she woke up with itching and vaginal discharge. She was seen and given Diflucan. She is currently asymptomatic. She has no other complaints.  I have reviewed the patients past medical, family, and social history.  I have reviewed the patient's medication list and allergies.   Review of Systems  All other systems reviewed and are negative.      Objective:   Physical Exam  Constitutional: She is oriented to person, place, and time. She appears well-developed and well-nourished.  HENT:  Head: Normocephalic and atraumatic.  Pulmonary/Chest: Effort normal.  Abdominal: Soft. There is no tenderness.  Neurological: She is alert and oriented to person, place, and time.  Skin: Skin is warm and dry.  Psychiatric: She has a normal mood and affect. Her behavior is normal. Judgment and thought content normal.      Assessment & Plan:  1. Yeast vaginitis Will give refill in 3 days if does not improve. Discussed wearing non-tight clothing, vaginal hygiene, etc. Follow-up as needed

## 2017-04-30 ENCOUNTER — Other Ambulatory Visit (HOSPITAL_COMMUNITY)
Admission: RE | Admit: 2017-04-30 | Discharge: 2017-04-30 | Disposition: A | Discharge status: Home / Self Care | Payer: Self-pay | Source: Ambulatory Visit | Attending: Physician Assistant | Admitting: Physician Assistant

## 2017-04-30 ENCOUNTER — Ambulatory Visit (INDEPENDENT_AMBULATORY_CARE_PROVIDER_SITE_OTHER): Payer: Self-pay | Admitting: Physician Assistant

## 2017-04-30 ENCOUNTER — Encounter (INDEPENDENT_AMBULATORY_CARE_PROVIDER_SITE_OTHER): Payer: Self-pay | Admitting: Physician Assistant

## 2017-04-30 VITALS — BP 128/78 | HR 95 | Temp 99.0°F | Resp 18 | Ht 67.0 in | Wt 230.0 lb

## 2017-04-30 DIAGNOSIS — B373 Candidiasis of vulva and vagina: Secondary | ICD-10-CM

## 2017-04-30 DIAGNOSIS — N898 Other specified noninflammatory disorders of vagina: Secondary | ICD-10-CM | POA: Insufficient documentation

## 2017-04-30 DIAGNOSIS — R7303 Prediabetes: Secondary | ICD-10-CM

## 2017-04-30 DIAGNOSIS — R739 Hyperglycemia, unspecified: Secondary | ICD-10-CM

## 2017-04-30 DIAGNOSIS — B3731 Acute candidiasis of vulva and vagina: Secondary | ICD-10-CM

## 2017-04-30 LAB — POCT URINALYSIS DIPSTICK
Bilirubin, UA: NEGATIVE
Glucose, UA: NEGATIVE
Ketones, UA: NEGATIVE
Leukocytes, UA: NEGATIVE
NITRITE UA: NEGATIVE
PH UA: 7 (ref 5.0–8.0)
Protein, UA: NEGATIVE
Spec Grav, UA: 1.02 (ref 1.010–1.025)
UROBILINOGEN UA: 0.2 U/dL

## 2017-04-30 LAB — POCT GLYCOSYLATED HEMOGLOBIN (HGB A1C): Hemoglobin A1C: 5.9

## 2017-04-30 MED ORDER — FLUCONAZOLE 150 MG PO TABS: 150.0000 mg | ORAL_TABLET | ORAL | 0 refills | Status: DC

## 2017-04-30 MED ORDER — METRONIDAZOLE 500 MG PO TABS: 500.0000 mg | ORAL_TABLET | Freq: Two times a day (BID) | ORAL | 0 refills | Status: AC

## 2017-04-30 NOTE — Patient Instructions (Signed)

## 2017-04-30 NOTE — Progress Notes (Signed)
Subjective:  Patient ID: Jeanne Chaney, female    DOB: [birth year]  Age: 26 y.o. MRN: [id number]  CC:   HPI Jeanne Chaney is a 26 y.o. female with a medical history of depression, HSV2, recurrent vaginal yeast infections presents as a new patient with a complaint of vaginal itching.  Has been to OB/GYN on 04/05/17 for recurrent yeast infections. Was not prescribed any medication and told to call GYN office if she should happen to have another yeast infection. Some of her visits to the ED revealed mild hyperglycemia. Has no family members that are diabetic. Currently sexually active. Uses Nexplanon for birth control. Does not endorse urinary symptoms, f/c/n/v, back pain, rash, swelling.        Outpatient Medications Prior to Visit  Medication Sig Dispense Refill  . acyclovir (ZOVIRAX) 400 MG tablet Take 1 tablet (400 mg total) by mouth 2 (two) times daily. 60 tablet 5  . Etonogestrel (IMPLANON Mason) Inject 1 application into the skin once.     No facility-administered medications prior to visit.      ROS Review of Systems  Constitutional: Negative for chills, fever and malaise/fatigue.  Eyes: Negative for blurred vision.  Respiratory: Negative for shortness of breath.   Cardiovascular: Negative for chest pain and palpitations.  Gastrointestinal: Negative for abdominal pain and nausea.  Genitourinary: Negative for dysuria and hematuria.       Vaginal itching  Musculoskeletal: Negative for joint pain and myalgias.  Skin: Negative for rash.  Neurological: Negative for tingling and headaches.  Psychiatric/Behavioral: Negative for depression. The patient is not nervous/anxious.     Objective:  BP 128/78 (BP Location: Left Arm, Patient Position: Sitting, Cuff Size: Large)   Pulse 95   Temp 99 F (37.2 C) (Oral)   Resp 18   Ht  (1.702 m)   Wt 230 lb (104.3 kg)   SpO2 98%   BMI 36.02 kg/m   BP/Weight 04/30/2017 04/05/2017 04/04/2017  Systolic BP 128 127 135  Diastolic BP 78  83 86  Wt. (Lbs) 230 230 -  BMI 36.02 36.02 -      Physical Exam  Constitutional: She is oriented to person, place, and time.  Well developed, obese, NAD, polite  HENT:  Head: Normocephalic and atraumatic.  Eyes: Conjunctivae are normal. No scleral icterus.  Neck: Normal range of motion. Neck supple. No thyromegaly present.  Cardiovascular: Normal rate, regular rhythm and normal heart sounds.   Pulmonary/Chest: Effort normal and breath sounds normal.  Abdominal: Soft. Bowel sounds are normal. There is no tenderness.  Genitourinary:  Genitourinary Comments: Somewhat viscous white vaginal drainage with no odor. Cervix friable and mild to moderately erythematous.   Musculoskeletal: She exhibits no edema.  Neurological: She is alert and oriented to person, place, and time. No cranial nerve deficit. Coordination normal.  Skin: Skin is warm and dry. No rash noted. No erythema. No pallor.  Psychiatric: She has a normal mood and affect. Her behavior is normal. Thought content normal.  Vitals reviewed.    Assessment & Plan:    1. Vaginal yeast infection - Cervicovaginal ancillary only - Begin fluconazole (DIFLUCAN) 150 MG tablet; Take 1 tablet (150 mg total) by mouth every 3 (three) days.  Dispense: 3 tablet; Refill: 0 - Begin metroNIDAZOLE (FLAGYL) 500 MG tablet; Take 1 tablet (500 mg total) by mouth 2 (two) times daily.  Dispense: 14 tablet; Refill: 0 - HIV antibody (with reflex)  2. Vaginal irritation - Urinalysis Dipstick normal in clinic today. -  Cervicovaginal ancillary only - Begin fluconazole (DIFLUCAN) 150 MG tablet; Take 1 tablet (150 mg total) by mouth every 3 (three) days.  Dispense: 3 tablet; Refill: 0 - Begin metroNIDAZOLE (FLAGYL) 500 MG tablet; Take 1 tablet (500 mg total) by mouth 2 (two) times daily.  Dispense: 14 tablet; Refill: 0 - HIV antibody (with reflex)  3. Prediabetes - HgB A1c 5.9% in clinic today.  4. Hyperglycemia - HgB A1c 5.9% in clinic  today.   Meds ordered this encounter  Medications  . fluconazole (DIFLUCAN) 150 MG tablet    Sig: Take 1 tablet (150 mg total) by mouth every 3 (three) days.    Dispense:  3 tablet    Refill:  0    Order Specific Question:   Supervising Provider    Answer:   Quentin AngstJEGEDE, OLUGBEMIGA E [id number]]  . metroNIDAZOLE (FLAGYL) 500 MG tablet    Sig: Take 1 tablet (500 mg total) by mouth 2 (two) times daily.    Dispense:  14 tablet    Refill:  0    Order Specific Question:   Supervising Provider    Answer:   Quentin AngstJEGEDE, OLUGBEMIGA E [[id number]]    Follow-up: Return in about 4 weeks (around 05/28/2017) for full physical.   Loletta Specteroger David Chantil Bari PA

## 2017-05-01 LAB — HIV ANTIBODY (ROUTINE TESTING W REFLEX): HIV Screen 4th Generation wRfx: NONREACTIVE

## 2017-05-01 LAB — CERVICOVAGINAL ANCILLARY ONLY: Wet Prep (BD Affirm): NEGATIVE

## 2017-05-02 ENCOUNTER — Telehealth (INDEPENDENT_AMBULATORY_CARE_PROVIDER_SITE_OTHER): Payer: Self-pay

## 2017-05-02 NOTE — Telephone Encounter (Signed)
-----   Message from Loletta Specteroger David Gomez, PA-C sent at 05/02/2017 12:26 PM EDT ----- HIV nonreactive

## 2017-05-02 NOTE — Telephone Encounter (Signed)
Patient aware of nonreactive HIV results. Maryjean Mornempestt S Roberts, CMA

## 2017-05-25 ENCOUNTER — Other Ambulatory Visit (INDEPENDENT_AMBULATORY_CARE_PROVIDER_SITE_OTHER): Payer: Self-pay | Admitting: Physician Assistant

## 2017-05-25 DIAGNOSIS — B373 Candidiasis of vulva and vagina: Secondary | ICD-10-CM

## 2017-05-25 DIAGNOSIS — N898 Other specified noninflammatory disorders of vagina: Secondary | ICD-10-CM

## 2017-05-25 DIAGNOSIS — B3731 Acute candidiasis of vulva and vagina: Secondary | ICD-10-CM

## 2017-05-28 ENCOUNTER — Telehealth: Payer: Self-pay | Admitting: *Deleted

## 2017-05-28 ENCOUNTER — Encounter (INDEPENDENT_AMBULATORY_CARE_PROVIDER_SITE_OTHER): Payer: Self-pay | Admitting: Physician Assistant

## 2017-05-28 ENCOUNTER — Ambulatory Visit (INDEPENDENT_AMBULATORY_CARE_PROVIDER_SITE_OTHER): Payer: Self-pay | Admitting: Physician Assistant

## 2017-05-28 VITALS — BP 133/80 | HR 89 | Temp 98.2°F | Ht 66.54 in | Wt 237.0 lb

## 2017-05-28 DIAGNOSIS — E66812 Obesity, class 2: Secondary | ICD-10-CM

## 2017-05-28 DIAGNOSIS — Z6837 Body mass index (BMI) 37.0-37.9, adult: Secondary | ICD-10-CM

## 2017-05-28 DIAGNOSIS — Z23 Encounter for immunization: Secondary | ICD-10-CM

## 2017-05-28 DIAGNOSIS — B379 Candidiasis, unspecified: Secondary | ICD-10-CM

## 2017-05-28 DIAGNOSIS — E669 Obesity, unspecified: Secondary | ICD-10-CM

## 2017-05-28 DIAGNOSIS — F329 Major depressive disorder, single episode, unspecified: Secondary | ICD-10-CM

## 2017-05-28 DIAGNOSIS — F32A Depression, unspecified: Secondary | ICD-10-CM

## 2017-05-28 MED ORDER — FLUCONAZOLE 150 MG PO TABS: 150.0000 mg | ORAL_TABLET | Freq: Once | ORAL | 0 refills | Status: DC

## 2017-05-28 MED ORDER — PHILLIPS COLON HEALTH PO CAPS: 1.0000 | ORAL_CAPSULE | Freq: Two times a day (BID) | ORAL | 0 refills | Status: DC

## 2017-05-28 MED ORDER — FLUCONAZOLE 150 MG PO TABS: 150.0000 mg | ORAL_TABLET | ORAL | 0 refills | Status: DC

## 2017-05-28 NOTE — Progress Notes (Signed)
Subjective:  Patient ID: Jeanne Chaney, female    DOB: 1990/12/07  Age: 26 y.o. MRN: [id number]  CC: vaginal irritation   HPI Jeanne Chaney is a 26 y.o. female with a medical history of yeast infection presents with vaginal irritation. Was seen with the same complaint nearly one month ago. Prescribed metronidazole and fluconazole which completely resolved her symptoms. However, she notes a thick white vaginal discharge and vaginal itching again. She is sexually active but does not engage in high risk sexual practices. STD testing over the past 2-3 months included HIV, RPR, and CT/GC which are all negative. She uses Nexplanon for birth control. Does not endorse any urinary symptoms, back pain, f/c/n/v, dyspareunia, or DUB.      Patient is happy that she is losing weight. Says she was 240 at the last visit (notes say 230) and she is now 66. Has begun to intake a low carb diet and is drinking water only. Does not exercise.       Outpatient Medications Prior to Visit  Medication Sig Dispense Refill  . acyclovir (ZOVIRAX) 400 MG tablet Take 1 tablet (400 mg total) by mouth 2 (two) times daily. 60 tablet 5  . Etonogestrel (IMPLANON Eugenio Saenz) Inject 1 application into the skin once.    . fluconazole (DIFLUCAN) 150 MG tablet Take 1 tablet (150 mg total) by mouth every 3 (three) days. 3 tablet 0   No facility-administered medications prior to visit.      ROS Review of Systems  Constitutional: Negative for chills, fever and malaise/fatigue.  Eyes: Negative for blurred vision.  Respiratory: Negative for shortness of breath.   Cardiovascular: Negative for chest pain and palpitations.  Gastrointestinal: Negative for abdominal pain and nausea.  Genitourinary: Negative for dysuria and hematuria.       Vaginal itching and discharge  Musculoskeletal: Negative for joint pain and myalgias.  Skin: Negative for rash.  Neurological: Negative for tingling and headaches.  Psychiatric/Behavioral: Negative for  depression. The patient is not nervous/anxious.     Objective:  BP 133/80 (BP Location: Left Arm, Patient Position: Sitting, Cuff Size: Large)   Pulse 89   Temp 98.2 F (36.8 C) (Oral)   Ht 5' 6.53" (1.69 m)   Wt 237 lb (107.5 kg)   SpO2 97%   BMI 37.64 kg/m   BP/Weight 05/28/2017 04/30/2017 04/05/2017  Systolic BP 133 128 127  Diastolic BP 80 78 83  Wt. (Lbs) 237 230 230  BMI 37.64 36.02 36.02      Physical Exam  Constitutional: She is oriented to person, place, and time.  Well developed, obese, NAD, polite  HENT:  Head: Normocephalic and atraumatic.  Eyes: No scleral icterus.  Cardiovascular: Normal rate, regular rhythm and normal heart sounds.   Pulmonary/Chest: Effort normal and breath sounds normal.  Abdominal: Soft. Bowel sounds are normal. She exhibits no distension. There is no tenderness.  Musculoskeletal: She exhibits no edema.  Neurological: She is alert and oriented to person, place, and time. No cranial nerve deficit. Coordination normal.  Skin: Skin is warm and dry. No rash noted. No erythema. No pallor.  Psychiatric: She has a normal mood and affect. Her behavior is normal. Thought content normal.  Vitals reviewed.    Assessment & Plan:    1. Yeast infection - Begin fluconazole (DIFLUCAN) 150 MG tablet; Take 1 tablet (150 mg total) by mouth every 3 (three) days.  Dispense: 2 tablet; Refill: 0 - Begin Probiotic Product ([employer]) CAPS; Take 1 capsule  by mouth 2 (two) times daily.  Dispense: 60 capsule; Refill: 0  2. Need for Tdap vaccination - Tdap vaccine greater than or equal to 7yo IM  3. Class 2 obesity without serious comorbidity with body mass index (BMI) of 37.0 to 37.9 in adult, unspecified obesity type - I have encouraged patient to continue her lower carb diet. She is missing exercise as an import part of her weight loss regimen and has been encouraged to do so. I have asked that she bring a diet and exercise log at the next visit  before I prescribe any weight loss pills.   Meds ordered this encounter  Medications  . DISCONTD: Probiotic Product ([employer]) CAPS    Sig: Take 1 capsule by mouth 2 (two) times daily.    Dispense:  60 capsule    Refill:  0    Order Specific Question:   Supervising Provider    Answer:   Quentin Angst [id number]  . DISCONTD: fluconazole (DIFLUCAN) 150 MG tablet    Sig: Take 1 tablet (150 mg total) by mouth once.    Dispense:  1 tablet    Refill:  0    Order Specific Question:   Supervising Provider    Answer:   Quentin Angst [id number]  . fluconazole (DIFLUCAN) 150 MG tablet    Sig: Take 1 tablet (150 mg total) by mouth every 3 (three) days.    Dispense:  2 tablet    Refill:  0    Order Specific Question:   Supervising Provider    Answer:   Quentin Angst [id number]  . Probiotic Product ([employer]) CAPS    Sig: Take 1 capsule by mouth 2 (two) times daily.    Dispense:  60 capsule    Refill:  0    Order Specific Question:   Supervising Provider    Answer:   Quentin Angst [[id number]]    Follow-up: Return if symptoms worsen or fail to improve, for obesity.   Loletta Specter PA

## 2017-05-28 NOTE — Telephone Encounter (Signed)
Fax received from [facility] requesting refill of Acyclovir. Message sent to Dr. Debroah Loop requesting refill order. I called pt and informed her that once we have received order for refill from the doctor we will let her know. Pt voiced understanding. She states she has moved and her preferred pharmacy is now [facility] on [address]. Pharmacy information updated in EMR.

## 2017-05-28 NOTE — Patient Instructions (Addendum)
Td Vaccine (Tetanus and Diphtheria): What You Need to Know 1. Why get vaccinated? Tetanus  and diphtheria are very serious diseases. They are rare in the [place] today, but people who do become infected often have severe complications. Td vaccine is used to protect adolescents and adults from both of these diseases. Both tetanus and diphtheria are infections caused by bacteria. Diphtheria spreads from person to person through coughing or sneezing. Tetanus-causing bacteria enter the body through cuts, scratches, or wounds. TETANUS (lockjaw) causes painful muscle tightening and stiffness, usually all over the body.  It can lead to tightening of muscles in the head and neck so you can't open your mouth, swallow, or sometimes even breathe. Tetanus kills about 1 out of every 10 people who are infected even after receiving the best medical care.  DIPHTHERIA can cause a thick coating to form in the back of the throat.  It can lead to breathing problems, paralysis, heart failure, and death.  Before vaccines, as many as 200,000 cases of diphtheria and hundreds of cases of tetanus were reported in the [place] each year. Since vaccination began, reports of cases for both diseases have dropped by about 99%. 2. Td vaccine Td vaccine can protect adolescents and adults from tetanus and diphtheria. Td is usually given as a booster dose every 10 years but it can also be given earlier after a severe and dirty wound or burn. Another vaccine, called Tdap, which protects against pertussis in addition to tetanus and diphtheria, is sometimes recommended instead of Td vaccine. Your doctor or the person giving you the vaccine can give you more information. Td may safely be given at the same time as other vaccines. 3. Some people should not get this vaccine  A person who has ever had a life-threatening allergic reaction after a previous dose of any tetanus or diphtheria containing vaccine, OR has a severe  allergy to any part of this vaccine, should not get Td vaccine. Tell the person giving the vaccine about any severe allergies.  Talk to your doctor if you: ? had severe pain or swelling after any vaccine containing diphtheria or tetanus, ? ever had a condition called Guillain Barre Syndrome (GBS), ? aren't feeling well on the day the shot is scheduled. 4. What are the risks from Td vaccine? With any medicine, including vaccines, there is a chance of side effects. These are usually mild and go away on their own. Serious reactions are also possible but are rare. Most people who get Td vaccine do not have any problems with it. Mild problems following Td vaccine: (Did not interfere with activities)  Pain where the shot was given (about 8 people in 10)  Redness or swelling where the shot was given (about 1 person in 4)  Mild fever (rare)  Headache (about 1 person in 4)  Tiredness (about 1 person in 4)  Moderate problems following Td vaccine: (Interfered with activities, but did not require medical attention)  Fever over 102F (rare)  Severe problems following Td vaccine: (Unable to perform usual activities; required medical attention)  Swelling, severe pain, bleeding and/or redness in the arm where the shot was given (rare).  Problems that could happen after any vaccine:  People sometimes faint after a medical procedure, including vaccination. Sitting or lying down for about 15 minutes can help prevent fainting, and injuries caused by a fall. Tell your doctor if you feel dizzy, or have vision changes or ringing in the ears.  Some people get   severe pain in the shoulder and have difficulty moving the arm where a shot was given. This happens very rarely.  Any medication can cause a severe allergic reaction. Such reactions from a vaccine are very rare, estimated at fewer than 1 in a million doses, and would happen within a few minutes to a few hours after the vaccination. As with any  medicine, there is a very remote chance of a vaccine causing a serious injury or death. The safety of vaccines is always being monitored. For more information, visit: http://floyd.org/ 5. What if there is a serious reaction? What should I look for? Look for anything that concerns you, such as signs of a severe allergic reaction, very high fever, or unusual behavior. Signs of a severe allergic reaction can include hives, swelling of the face and throat, difficulty breathing, a fast heartbeat, dizziness, and weakness. These would usually start a few minutes to a few hours after the vaccination. What should I do?  If you think it is a severe allergic reaction or other emergency that can't wait, call 9-1-1 or get the person to the nearest hospital. Otherwise, call your doctor.  Afterward, the reaction should be reported to the Vaccine Adverse Event Reporting System (VAERS). Your doctor might file this report, or you can do it yourself through the VAERS web site at www.vaers.LAgents.no, or by calling 1-(360) 675-9827. ? VAERS does not give medical advice. 6. The National Vaccine Injury Compensation Program The [employer] Vaccine Injury Compensation Program (VICP) is a federal program that was created to compensate people who may have been injured by certain vaccines. Persons who believe they may have been injured by a vaccine can learn about the program and about filing a claim by calling 1-765-648-1424 or visiting the VICP website at SpiritualWord.at. There is a time limit to file a claim for compensation. 7. How can I learn more?  Ask your doctor. He or she can give you the vaccine package insert or suggest other sources of information.  Call your local or state health department.  Contact the Centers for Disease Control and Prevention (CDC): ? Call 715-711-3119 (1-800-CDC-INFO) ? Visit CDC's website at PicCapture.uy CDC Td Vaccine VIS (11/30/15) This information is  not intended to replace advice given to you by your health care provider. Make sure you discuss any questions you have with your health care provider. Document Released: 06/04/2006 Document Revised: 04/27/2016 Document Reviewed: 04/27/2016 Elsevier Interactive Patient Education  2017 [employer]. Vaginal Yeast infection, Adult Vaginal yeast infection is a condition that causes soreness, swelling, and redness (inflammation) of the vagina. It also causes vaginal discharge. This is a common condition. Some women get this infection frequently. What are the causes? This condition is caused by a change in the normal balance of the yeast (candida) and bacteria that live in the vagina. This change causes an overgrowth of yeast, which causes the inflammation. What increases the risk? This condition is more likely to develop in:  Women who take antibiotic medicines.  Women who have diabetes.  Women who take birth control pills.  Women who are pregnant.  Women who douche often.  Women who have a weak defense (immune) system.  Women who have been taking steroid medicines for a long time.  Women who frequently wear tight clothing.  What are the signs or symptoms? Symptoms of this condition include:  White, thick vaginal discharge.  Swelling, itching, redness, and irritation of the vagina. The lips of the vagina (vulva) may be affected as well.  Pain or a burning feeling while urinating.  Pain during sex.  How is this diagnosed? This condition is diagnosed with a medical history and physical exam. This will include a pelvic exam. Your health care provider will examine a sample of your vaginal discharge under a microscope. Your health care provider may send this sample for testing to confirm the diagnosis. How is this treated? This condition is treated with medicine. Medicines may be over-the-counter or prescription. You may be told to use one or more of the following:  Medicine that is  taken orally.  Medicine that is applied as a cream.  Medicine that is inserted directly into the vagina (suppository).  Follow these instructions at home:  Take or apply over-the-counter and prescription medicines only as told by your health care provider.  Do not have sex until your health care provider has approved. Tell your sex partner that you have a yeast infection. That person should go to his or her health care provider if he or she develops symptoms.  Do not wear tight clothes, such as pantyhose or tight pants.  Avoid using tampons until your health care provider approves.  Eat more yogurt. This may help to keep your yeast infection from returning.  Try taking a sitz bath to help with discomfort. This is a warm water bath that is taken while you are sitting down. The water should only come up to your hips and should cover your buttocks. Do this 3-4 times per day or as told by your health care provider.  Do not douche.  Wear breathable, cotton underwear.  If you have diabetes, keep your blood sugar levels under control. Contact a health care provider if:  You have a fever.  Your symptoms go away and then return.  Your symptoms do not get better with treatment.  Your symptoms get worse.  You have new symptoms.  You develop blisters in or around your vagina.  You have blood coming from your vagina and it is not your menstrual period.  You develop pain in your abdomen. This information is not intended to replace advice given to you by your health care provider. Make sure you discuss any questions you have with your health care provider. Document Released: 05/17/2005 Document Revised: 01/19/2016 Document Reviewed: 02/08/2015 Elsevier Interactive Patient Education  2018 [employer].

## 2017-05-29 MED ORDER — ACYCLOVIR 400 MG PO TABS: 400.0000 mg | ORAL_TABLET | Freq: Two times a day (BID) | ORAL | 5 refills | Status: DC

## 2017-05-29 NOTE — Telephone Encounter (Signed)
Received order from Dr Rachelle Hora for patient to have 6 refills. Called and informed patient. Patient verbalized understanding and had no questions

## 2017-05-30 ENCOUNTER — Encounter: Payer: Self-pay | Admitting: Obstetrics & Gynecology

## 2017-05-30 ENCOUNTER — Other Ambulatory Visit: Payer: Self-pay | Admitting: Obstetrics & Gynecology

## 2017-05-30 DIAGNOSIS — F32A Depression, unspecified: Secondary | ICD-10-CM

## 2017-05-30 DIAGNOSIS — F329 Major depressive disorder, single episode, unspecified: Secondary | ICD-10-CM

## 2017-05-30 MED ORDER — ACYCLOVIR 400 MG PO TABS: 400.0000 mg | ORAL_TABLET | Freq: Two times a day (BID) | ORAL | 5 refills | Status: DC

## 2017-06-29 ENCOUNTER — Ambulatory Visit (HOSPITAL_COMMUNITY)
Admission: EM | Admit: 2017-06-29 | Discharge: 2017-06-29 | Disposition: A | Discharge status: Home / Self Care | Payer: Medicaid Other | Attending: Internal Medicine | Admitting: Internal Medicine

## 2017-06-29 ENCOUNTER — Encounter (HOSPITAL_COMMUNITY): Payer: Self-pay | Admitting: Family Medicine

## 2017-06-29 DIAGNOSIS — N898 Other specified noninflammatory disorders of vagina: Secondary | ICD-10-CM

## 2017-06-29 DIAGNOSIS — Z79899 Other long term (current) drug therapy: Secondary | ICD-10-CM | POA: Insufficient documentation

## 2017-06-29 MED ORDER — FLUCONAZOLE 200 MG PO TABS: 200.0000 mg | ORAL_TABLET | Freq: Once | ORAL | 0 refills | Status: AC

## 2017-06-29 NOTE — Discharge Instructions (Signed)
We will notify you of any positive findings from testing today or if changes need to be made to medications. If symptoms persist or do not improve please follow up with your primary care provider.

## 2017-06-29 NOTE — ED Provider Notes (Signed)
[facility]    CSN: [id number] Arrival date & time: 06/29/17  1604     History   Chief Complaint Chief Complaint  Patient presents with  . Vaginal Itching    HPI Jonice Terrilee CroakKnight is a 26 y.o. female.   Trinna presents with complaints of vaginal itching which started today. She states she gets frequent yeast infections, last was a month ago and this feels similar. Did not notice any vaginal discharge. Denies urinary symptoms, fevers, abdominal or back pain, vaginal pain. She is sexually active with one partner. Denies any known risk of STD at this time, does not always use protection. Denies rash or lesions.   ROS per HPI.       Past Medical History:  Diagnosis Date  . Yeast infection     Patient Active Problem List   Diagnosis Date Noted  . Vaginal irritation 04/30/2017  . 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 Pap smear for cervical cancer screening 10/06/2016    History reviewed. No pertinent surgical history.  OB History    Gravida Para Term Preterm AB Living   2 2 2     2    SAB TAB Ectopic Multiple Live Births           2       Home Medications    Prior to Admission medications   Medication Sig Start Date End Date Taking? Authorizing Provider  acyclovir (ZOVIRAX) 400 MG tablet Take 1 tablet (400 mg total) by mouth 2 (two) times daily. 05/30/17   Adam PhenixArnold, James G, MD  Etonogestrel [facility](IMPLANON [place]) Inject 1 application into the skin once.    [provider]  fluconazole (DIFLUCAN) 200 MG tablet Take 1 tablet (200 mg total) once for 1 dose by mouth. 06/29/17 06/29/17  Georgetta HaberBurky, Konstantina Nachreiner B, NP  Probiotic Product ([facility]ON HEALTH) CAPS Take 1 capsule by mouth 2 (two) times daily. 05/28/17   Loletta SpecterGomez, Roger David, PA-C    Family History History reviewed. No pertinent family history.  Social History Social History   Tobacco Use  . Smoking status: Never Smoker  . Smokeless tobacco: Never Used  Substance Use Topics  . Alcohol use: No  . Drug  use: No     Allergies   Patient has no known allergies.   Review of Systems Review of Systems   Physical Exam Triage Vital Signs ED Triage Vitals [06/29/17 1647]  Enc Vitals Group     BP 136/79     Pulse Rate 72     Resp 18     Temp 98.2 F (36.8 C)     Temp src      SpO2 99 %     Weight      Height      Head Circumference      Peak Flow      Pain Score      Pain Loc      Pain Edu?      Excl. in GC?    No data found.  Updated Vital Signs BP 136/79   Pulse 72   Temp 98.2 F (36.8 C)   Resp 18   SpO2 99%   Visual Acuity Right Eye Distance:   Left Eye Distance:   Bilateral Distance:    Right Eye Near:   Left Eye Near:    Bilateral Near:     Physical Exam  Constitutional: She is oriented to person, place, and time. She appears well-developed and well-nourished. No distress.  Cardiovascular: Normal rate, regular rhythm and normal heart sounds.  Pulmonary/Chest: Effort normal and breath sounds normal.  Abdominal: Normal appearance. There is no tenderness.  Genitourinary:  Genitourinary Comments: Scant white discharge   Neurological: She is alert and oriented to person, place, and time.  Skin: Skin is warm and dry.  Vitals reviewed.    UC Treatments / Results  Labs (all labs ordered are listed, but only abnormal results are displayed) Labs Reviewed  CERVICOVAGINAL ANCILLARY ONLY    EKG  EKG Interpretation None       Radiology No results found.  Procedures Procedures (including critical care time)  Medications Ordered in UC Medications - No data to display   Initial Impression / Assessment and Plan / UC Course  I have reviewed the triage vital signs and the nursing notes.  Pertinent labs & imaging results that were available during my care of the patient were reviewed by me and considered in my medical decision making (see chart for details).     Will treat for yeast at this time. Will notify of any positive test results if  medications need to be adjusted. Continue with probiotic. If symptoms worsen or do not improve in the next week to return to be seen or to follow up with PCP. Patient verbalized understanding and agreeable to plan.    Final Clinical Impressions(s) / UC Diagnoses   Final diagnoses:  Vaginal itching    ED Discharge Orders        Ordered    fluconazole (DIFLUCAN) 200 MG tablet   Once     06/29/17 1746       Controlled Substance Prescriptions [place] Controlled Substance Registry consulted? Not Applicable   Georgetta HaberBurky, Gareth Fitzner B, NP 06/29/17 1751

## 2017-06-29 NOTE — ED Triage Notes (Signed)
Pt here for vaginal itching and irritation.  

## 2017-07-02 LAB — CERVICOVAGINAL ANCILLARY ONLY
Bacterial vaginitis: NEGATIVE
Candida vaginitis: POSITIVE — AB
Chlamydia: NEGATIVE
Neisseria Gonorrhea: NEGATIVE
Trichomonas: NEGATIVE

## 2017-07-18 ENCOUNTER — Encounter (HOSPITAL_COMMUNITY): Payer: Self-pay | Admitting: Family Medicine

## 2017-07-18 DIAGNOSIS — N898 Other specified noninflammatory disorders of vagina: Secondary | ICD-10-CM | POA: Insufficient documentation

## 2017-07-18 DIAGNOSIS — Z5321 Procedure and treatment not carried out due to patient leaving prior to being seen by health care provider: Secondary | ICD-10-CM | POA: Insufficient documentation

## 2017-07-18 NOTE — ED Triage Notes (Signed)
Patient reports she is experiencing vaginal discharge and vaginal itching. Symptoms started today. Also, reports she has increased amounts of yeast infection since August. She has been seen by PCP for the same symptoms.

## 2017-07-19 ENCOUNTER — Other Ambulatory Visit: Payer: Self-pay

## 2017-07-19 ENCOUNTER — Telehealth (INDEPENDENT_AMBULATORY_CARE_PROVIDER_SITE_OTHER): Payer: Self-pay | Admitting: Physician Assistant

## 2017-07-19 ENCOUNTER — Encounter (HOSPITAL_COMMUNITY): Payer: Self-pay | Admitting: *Deleted

## 2017-07-19 ENCOUNTER — Ambulatory Visit (HOSPITAL_COMMUNITY)
Admission: EM | Admit: 2017-07-19 | Discharge: 2017-07-19 | Disposition: A | Discharge status: Home / Self Care | Payer: Self-pay | Attending: Physician Assistant | Admitting: Physician Assistant

## 2017-07-19 ENCOUNTER — Emergency Department (HOSPITAL_COMMUNITY)
Admission: EM | Admit: 2017-07-19 | Discharge: 2017-07-19 | Disposition: A | Discharge status: Home / Self Care | Payer: Medicaid Other | Attending: Emergency Medicine | Admitting: Emergency Medicine

## 2017-07-19 DIAGNOSIS — B3731 Acute candidiasis of vulva and vagina: Secondary | ICD-10-CM

## 2017-07-19 DIAGNOSIS — B373 Candidiasis of vulva and vagina: Secondary | ICD-10-CM | POA: Insufficient documentation

## 2017-07-19 DIAGNOSIS — Z79899 Other long term (current) drug therapy: Secondary | ICD-10-CM | POA: Insufficient documentation

## 2017-07-19 HISTORY — DX: Herpesviral infection of urogenital system, unspecified: A60.00

## 2017-07-19 HISTORY — DX: Trichomoniasis, unspecified: A59.9

## 2017-07-19 LAB — POCT URINALYSIS DIP (DEVICE)
BILIRUBIN URINE: NEGATIVE
Glucose, UA: NEGATIVE mg/dL
Ketones, ur: NEGATIVE mg/dL
NITRITE: NEGATIVE
PH: 7 (ref 5.0–8.0)
PROTEIN: NEGATIVE mg/dL
Specific Gravity, Urine: 1.02 (ref 1.005–1.030)
Urobilinogen, UA: 1 mg/dL (ref 0.0–1.0)

## 2017-07-19 LAB — POCT PREGNANCY, URINE: Preg Test, Ur: NEGATIVE

## 2017-07-19 MED ORDER — FLUCONAZOLE 200 MG PO TABS: 200.0000 mg | ORAL_TABLET | Freq: Every day | ORAL | 0 refills | Status: AC

## 2017-07-19 NOTE — ED Notes (Signed)
Called in medication to [facility] [place] [facility] as requested by patient on discharge

## 2017-07-19 NOTE — ED Notes (Signed)
Patient was called for assigned room with no answer.  

## 2017-07-19 NOTE — ED Triage Notes (Signed)
Per pt having vaginal itching yesterday, having clear and light yellow mucus and does not burn when urinating. Per pt just got a new birth control in March 2018 and not she does not get any period and per pt there's no chance of pregnancy.

## 2017-07-19 NOTE — ED Notes (Signed)
No answer when called for triage 

## 2017-07-19 NOTE — ED Provider Notes (Signed)
[facility]    CSN: [id number] Arrival date & time: 07/19/17  1448     History   Chief Complaint Chief Complaint  Patient presents with  . Vaginal Itching    HPI Jeanne Chaney is a 26 y.o. female.   26 year-old female, presenting today complaining of vaginal itching. She states that this is a recurrent issue for her. Symptoms started yesterday. She is having a clear discharge. States that these are the same symtpoms she typically has with a yeast infection. No urinary symptoms, abdominal pain   The history is provided by the patient.  Vaginal Itching  This is a new problem. The current episode started yesterday. The problem occurs constantly. The problem has not changed since onset.Pertinent negatives include no chest pain, no abdominal pain, no headaches and no shortness of breath. Nothing aggravates the symptoms. Nothing relieves the symptoms. She has tried nothing for the symptoms. The treatment provided no relief.    Past Medical History:  Diagnosis Date  . Herpes genitalia   . Trichimoniasis   . Yeast infection     Patient Active Problem List   Diagnosis Date Noted  . Vaginal irritation 04/30/2017  . 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 Pap smear for cervical cancer screening 10/06/2016    History reviewed. No pertinent surgical history.  OB History    Gravida Para Term Preterm AB Living   2 2 2     2    SAB TAB Ectopic Multiple Live Births           2       Home Medications    Prior to Admission medications   Medication Sig Start Date End Date Taking? Authorizing Provider  acyclovir (ZOVIRAX) 400 MG tablet Take 1 tablet (400 mg total) by mouth 2 (two) times daily. 05/30/17   Adam PhenixArnold, James G, MD  Etonogestrel [facility](IMPLANON [place]) Inject 1 application into the skin once.    [provider]  fluconazole (DIFLUCAN) 200 MG tablet Take 1 tablet (200 mg total) by mouth daily for 2 doses. Take one tab now and may repeat in 3 days if symptoms  persist 07/19/17 07/21/17  Daisi Kentner, FiskdaleOlivia C, PA-C  Probiotic Product (PHILLIPS COLON HEALTH) CAPS Take 1 capsule by mouth 2 (two) times daily. 05/28/17   Loletta SpecterGomez, Roger David, PA-C    Family History Family History  Problem Relation Age of Onset  . Asthma Mother     Social History Social History   Tobacco Use  . Smoking status: Never Smoker  . Smokeless tobacco: Never Used  Substance Use Topics  . Alcohol use: No  . Drug use: No     Allergies   Patient has no known allergies.   Review of Systems Review of Systems  Constitutional: Negative for chills and fever.  HENT: Negative for ear pain and sore throat.   Eyes: Negative for pain and visual disturbance.  Respiratory: Negative for cough and shortness of breath.   Cardiovascular: Negative for chest pain and palpitations.  Gastrointestinal: Negative for abdominal pain and vomiting.  Genitourinary: Positive for vaginal discharge. Negative for dysuria and hematuria.  Musculoskeletal: Negative for arthralgias and back pain.  Skin: Negative for color change and rash.  Neurological: Negative for seizures, syncope and headaches.  All other systems reviewed and are negative.    Physical Exam Triage Vital Signs ED Triage Vitals  Enc Vitals Group     BP 07/19/17 1509 134/80     Pulse Rate 07/19/17 1509 97  Resp --      Temp 07/19/17 1509 98.5 F (36.9 C)     Temp Source 07/19/17 1509 Oral     SpO2 07/19/17 1509 100 %     Weight --      Height --      Head Circumference --      Peak Flow --      Pain Score 07/19/17 1507 6     Pain Loc --      Pain Edu? --      Excl. in GC? --    No data found.  Updated Vital Signs BP 134/80 (BP Location: Left Arm)   Pulse 97   Temp 98.5 F (36.9 C) (Oral)   SpO2 100%   Visual Acuity Right Eye Distance:   Left Eye Distance:   Bilateral Distance:    Right Eye Near:   Left Eye Near:    Bilateral Near:     Physical Exam  Constitutional: She appears well-developed and  well-nourished. No distress.  HENT:  Head: Normocephalic and atraumatic.  Eyes: Conjunctivae are normal.  Neck: Neck supple.  Cardiovascular: Normal rate and regular rhythm.  No murmur heard. Pulmonary/Chest: Effort normal and breath sounds normal. No respiratory distress.  Abdominal: Soft. There is no tenderness.  Musculoskeletal: She exhibits no edema.  Neurological: She is alert.  Skin: Skin is warm and dry.  Psychiatric: She has a normal mood and affect.  Nursing note and vitals reviewed.    UC Treatments / Results  Labs (all labs ordered are listed, but only abnormal results are displayed) Labs Reviewed  POCT URINALYSIS DIP (DEVICE) - Abnormal; Notable for the following components:      Result Value   Hgb urine dipstick TRACE (*)    Leukocytes, UA TRACE (*)    All other components within normal limits  POCT PREGNANCY, URINE  URINE CYTOLOGY ANCILLARY ONLY    EKG  EKG Interpretation None       Radiology No results found.  Procedures Procedures (including critical care time)  Medications Ordered in UC Medications - No data to display   Initial Impression / Assessment and Plan / UC Course  I have reviewed the triage vital signs and the nursing notes.  Pertinent labs & imaging results that were available during my care of the patient were reviewed by me and considered in my medical decision making (see chart for details).     Recurrent yeast vaginitis - will treat with diflucan Recommended GYN follow-up due to recurrence  No recent antibiotic use. She states she has been tested for DM and was borderline    Final Clinical Impressions(s) / UC Diagnoses   Final diagnoses:  Yeast vaginitis    ED Discharge Orders        Ordered    fluconazole (DIFLUCAN) 200 MG tablet  Daily     07/19/17 1527       Controlled Substance Prescriptions [place] Controlled Substance Registry consulted? Not Applicable   Alecia LemmingBlue, Saman Giddens C, PA-C 07/19/17 1530    [address]Savera Donson  C, [place] JerseyPA-C 07/19/17 1531

## 2017-07-19 NOTE — ED Notes (Signed)
Patient was called a second time for assigned room with no answer.

## 2017-07-19 NOTE — Telephone Encounter (Signed)
Spoke with patient and explained she has to schedule appt in order to receive medication for yeast infection, provider has to confirm that patient indeed has a yeast infection. Patient understood and scheduled for Monday 12/3. Maryjean Mornempestt S Roberts, CMA

## 2017-07-19 NOTE — Telephone Encounter (Signed)
Patient called to schedule an appointment for Yeast Infection and our next available is until Monday  And she said that she can wait that long and want PA Lily KocherGomez to prescribe something for her [facility] Thank you

## 2017-07-20 LAB — URINE CYTOLOGY ANCILLARY ONLY
Chlamydia: NEGATIVE
Neisseria Gonorrhea: NEGATIVE
Trichomonas: NEGATIVE

## 2017-07-23 ENCOUNTER — Ambulatory Visit (INDEPENDENT_AMBULATORY_CARE_PROVIDER_SITE_OTHER): Payer: Self-pay | Admitting: Physician Assistant

## 2017-07-23 ENCOUNTER — Other Ambulatory Visit: Payer: Self-pay

## 2017-07-23 ENCOUNTER — Encounter (INDEPENDENT_AMBULATORY_CARE_PROVIDER_SITE_OTHER): Payer: Self-pay | Admitting: Physician Assistant

## 2017-07-23 ENCOUNTER — Telehealth (INDEPENDENT_AMBULATORY_CARE_PROVIDER_SITE_OTHER): Payer: Self-pay | Admitting: Physician Assistant

## 2017-07-23 VITALS — BP 122/80 | HR 93 | Temp 98.2°F | Wt 231.6 lb

## 2017-07-23 DIAGNOSIS — Z114 Encounter for screening for human immunodeficiency virus [HIV]: Secondary | ICD-10-CM

## 2017-07-23 DIAGNOSIS — N898 Other specified noninflammatory disorders of vagina: Secondary | ICD-10-CM

## 2017-07-23 LAB — URINE CYTOLOGY ANCILLARY ONLY
Bacterial vaginitis: POSITIVE — AB
Candida vaginitis: NEGATIVE

## 2017-07-23 MED ORDER — CLINDAMYCIN PHOSPHATE 2 % VA CREA: 1.0000 | TOPICAL_CREAM | Freq: Every day | VAGINAL | 0 refills | Status: DC

## 2017-07-23 MED ORDER — METRONIDAZOLE 500 MG PO TABS: 500.0000 mg | ORAL_TABLET | Freq: Two times a day (BID) | ORAL | 0 refills | Status: AC

## 2017-07-23 MED ORDER — FLUCONAZOLE 150 MG PO TABS
150.0000 mg | ORAL_TABLET | ORAL | 0 refills | Status: DC
Start: 2017-07-23 — End: 2017-09-13

## 2017-07-23 NOTE — Telephone Encounter (Signed)
Patient was seen today and PA Rx a cream for her yeast infection but she said is too expensive if can be prescribe something else [facility]  Thank you

## 2017-07-23 NOTE — Telephone Encounter (Signed)
[facility]

## 2017-07-23 NOTE — Telephone Encounter (Signed)
FWD to PCP. Yong Wahlquist S Sharrod Achille, CMA  

## 2017-07-23 NOTE — Progress Notes (Signed)
Subjective:  Patient ID: Jeanne Chaney, female    DOB: 05/21/1991  Age: 26 y.o. MRN: [id number]  CC: vaginitis  HPI Jeanne Chaney is a 26 y.o. female with a medical history of fungal and bacterial vaginitis presents with mild vaginal discomfort and the request to have HIV testing done. Says vaginal discomfort was much worse but she went to urgent care and was prescribed fluconazole. Urine cytology confirmed no chlamydia, gonorrhea, or trichomonas. Requests HIV because she does not trust her sexual partner. Has had 5 HIV tests over nearly one year. Reports no current vaginal discharge, genital lesions, pelvic pain, dyspareunia, or constitutional symptoms.          Outpatient Medications Prior to Visit  Medication Sig Dispense Refill  . acyclovir (ZOVIRAX) 400 MG tablet Take 1 tablet (400 mg total) by mouth 2 (two) times daily. 60 tablet 5  . Etonogestrel (IMPLANON [place]) Inject 1 application into the skin once.    . Probiotic Product (PHILLIPS COLON HEALTH) CAPS Take 1 capsule by mouth 2 (two) times daily. 60 capsule 0   No facility-administered medications prior to visit.      ROS Review of Systems  Constitutional: Negative for chills, fever and malaise/fatigue.  Eyes: Negative for blurred vision.  Respiratory: Negative for shortness of breath.   Cardiovascular: Negative for chest pain and palpitations.  Gastrointestinal: Negative for abdominal pain and nausea.  Genitourinary: Negative for dysuria and hematuria.  Musculoskeletal: Negative for joint pain and myalgias.  Skin: Negative for rash.  Neurological: Negative for tingling and headaches.  Psychiatric/Behavioral: Negative for depression. The patient is not nervous/anxious.     Objective:  BP 122/80 (BP Location: Left Arm, Patient Position: Sitting, Cuff Size: Large)   Pulse 93   Temp 98.2 F (36.8 C) (Oral)   Wt 231 lb 9.6 oz (105.1 kg)   SpO2 96%   BMI 36.27 kg/m   BP/Weight 07/23/2017 07/19/2017 07/19/2017   Systolic BP 122 134 -  Diastolic BP 80 80 -  Wt. (Lbs) 231.6 - -  BMI 36.27 - 36.02      Physical Exam  Constitutional: She is oriented to person, place, and time.  Well developed, obese, NAD, polite  HENT:  Head: Normocephalic and atraumatic.  Eyes: No scleral icterus.  Cardiovascular: Normal rate, regular rhythm and normal heart sounds.  Pulmonary/Chest: Effort normal and breath sounds normal.  Genitourinary:  Genitourinary Comments: No genital lesions. Some thick white curdy discharge. Fishy odor.  Neurological: She is alert and oriented to person, place, and time. No cranial nerve deficit. Coordination normal.  Skin: Skin is warm and dry. No rash noted. No erythema.  Psychiatric: She has a normal mood and affect. Her behavior is normal. Thought content normal.  Vitals reviewed.    Assessment & Plan:    1. Vaginal discharge - Begin fluconazole (DIFLUCAN) 150 MG tablet; Take 1 tablet (150 mg total) by mouth once a week.  Dispense: 2 tablet; Refill: 0 - Begin metroNIDAZOLE (FLAGYL) 500 MG tablet; Take 1 tablet (500 mg total) by mouth 2 (two) times daily for 7 days.  Dispense: 14 tablet; Refill: 0 - Clindamycin 2% vaginal cream originally ordered but patient's insurance does not cover.  - Ambulatory referral to Gynecology   2. Screening for HIV (human immunodeficiency virus) - HIV antibody - I have counseled patient on abstaining from sex or protecting herself against the risk of STD from people she is suspicious of carrying disease.     Meds ordered this encounter  Medications  . fluconazole (DIFLUCAN) 150 MG tablet    Sig: Take 1 tablet (150 mg total) by mouth once a week.    Dispense:  2 tablet    Refill:  0    Order Specific Question:   Supervising Provider    Answer:   Quentin AngstJEGEDE, OLUGBEMIGA E [id number]]  . DISCONTD: clindamycin (CLEOCIN) 2 % vaginal cream    Sig: Place 1 Applicatorful vaginally at bedtime.    Dispense:  40 g    Refill:  0    Order Specific  Question:   Supervising Provider    Answer:   Quentin AngstJEGEDE, OLUGBEMIGA E [id number]]  . metroNIDAZOLE (FLAGYL) 500 MG tablet    Sig: Take 1 tablet (500 mg total) by mouth 2 (two) times daily for 7 days.    Dispense:  14 tablet    Refill:  0    Order Specific Question:   Supervising Provider    Answer:   Quentin AngstJEGEDE, OLUGBEMIGA E [id number]]    Follow-up: Return if symptoms worsen or fail to improve.   Jeanne Specteroger David Tionne Carelli PA

## 2017-07-23 NOTE — Patient Instructions (Signed)

## 2017-07-23 NOTE — Telephone Encounter (Signed)
Patient aware. Jeanne Chaney, CMA  

## 2017-07-24 LAB — HIV ANTIBODY (ROUTINE TESTING W REFLEX): HIV Screen 4th Generation wRfx: NONREACTIVE

## 2017-07-25 ENCOUNTER — Telehealth (INDEPENDENT_AMBULATORY_CARE_PROVIDER_SITE_OTHER): Payer: Self-pay

## 2017-07-25 NOTE — Telephone Encounter (Signed)
-----   Message from Loletta Specteroger David Gomez, PA-C sent at 07/24/2017  4:53 PM EST ----- HIV negative.

## 2017-07-25 NOTE — Telephone Encounter (Signed)
Patient aware of negative HIV. Jeanne Chaney, CMA  

## 2017-08-07 ENCOUNTER — Encounter: Payer: Self-pay | Admitting: Obstetrics and Gynecology

## 2017-09-01 ENCOUNTER — Emergency Department (HOSPITAL_COMMUNITY)
Admission: EM | Admit: 2017-09-01 | Discharge: 2017-09-01 | Disposition: A | Discharge status: Home / Self Care | Payer: Medicaid Other | Attending: Emergency Medicine | Admitting: Emergency Medicine

## 2017-09-01 ENCOUNTER — Encounter (HOSPITAL_COMMUNITY): Payer: Self-pay | Admitting: Emergency Medicine

## 2017-09-01 DIAGNOSIS — N760 Acute vaginitis: Secondary | ICD-10-CM | POA: Insufficient documentation

## 2017-09-01 DIAGNOSIS — N898 Other specified noninflammatory disorders of vagina: Secondary | ICD-10-CM

## 2017-09-01 DIAGNOSIS — B9689 Other specified bacterial agents as the cause of diseases classified elsewhere: Secondary | ICD-10-CM

## 2017-09-01 LAB — WET PREP, GENITAL
SPERM: NONE SEEN
TRICH WET PREP: NONE SEEN
Yeast Wet Prep HPF POC: NONE SEEN

## 2017-09-01 LAB — GC/CHLAMYDIA PROBE AMP (~~LOC~~) NOT AT ARMC
CHLAMYDIA, DNA PROBE: NEGATIVE
NEISSERIA GONORRHEA: NEGATIVE

## 2017-09-01 LAB — POC URINE PREG, ED: Preg Test, Ur: NEGATIVE

## 2017-09-01 MED ORDER — METRONIDAZOLE 500 MG PO TABS: 500.0000 mg | ORAL_TABLET | Freq: Two times a day (BID) | ORAL | 0 refills | Status: DC

## 2017-09-01 NOTE — Discharge Instructions (Signed)
Wet prep shows bacterial vaginosis, please take course of Flagyl as directed, do not drink alcohol while taking this medication.  You have STD testing pending and will be called in 2-3 days if any of the results are positive.

## 2017-09-01 NOTE — ED Provider Notes (Signed)
[facility] Provider Note   CSN: [id number] Arrival date & time: 09/01/17  4098     History   Chief Complaint Chief Complaint  Patient presents with  . Vaginal Discharge    HPI Jeanne Chaney is a 27 y.o. female.  Jeanne Chaney is a 27 y.o. Female otherwise healthy, presents for evaluation of 3 days of vaginal odor.  Patient reports some associated whitish gray discharge. Patient denies any pelvic pain or discomfort.  Denies any abdominal pain, nausea, vomiting, diarrhea.  No fevers or chills.  No burning with urination or urinary frequency.  Patient reports she has not been sexually active within the past month, recently had STI testing done.  Patient has Nexplanon implant.      Past Medical History:  Diagnosis Date  . Herpes genitalia   . Trichimoniasis   . Yeast infection     Patient Active Problem List   Diagnosis Date Noted  . Vaginal irritation 04/30/2017  . 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 Pap smear for cervical cancer screening 10/06/2016    History reviewed. No pertinent surgical history.  OB History    Gravida Para Term Preterm AB Living   2 2 2     2    SAB TAB Ectopic Multiple Live Births           2       Home Medications    Prior to Admission medications   Medication Sig Start Date End Date Taking? Authorizing Provider  acyclovir (ZOVIRAX) 400 MG tablet Take 1 tablet (400 mg total) by mouth 2 (two) times daily. 05/30/17   Adam Phenix, MD  Etonogestrel [facility]) Inject 1 application into the skin once.    [provider]  fluconazole (DIFLUCAN) 150 MG tablet Take 1 tablet (150 mg total) by mouth once a week. 07/23/17   Loletta Specter, PA-C  Probiotic Product [facility]ON [facility]ALTH) CAPS Take 1 capsule by mouth 2 (two) times daily. 05/28/17   Loletta Specter, PA-C    Family History Family History  Problem Relation Age of Onset  . Asthma Mother     Social History Social History    Tobacco Use  . Smoking status: Never Smoker  . Smokeless tobacco: Never Used  Substance Use Topics  . Alcohol use: No  . Drug use: No     Allergies   Patient has no known allergies.   Review of Systems Review of Systems  Constitutional: Negative for chills and fever.  Gastrointestinal: Negative for abdominal pain, diarrhea, nausea and vomiting.  Genitourinary: Positive for vaginal discharge. Negative for dysuria, flank pain, frequency, pelvic pain, vaginal bleeding and vaginal pain.  Skin: Negative for rash.  All other systems reviewed and are negative.    Physical Exam Updated Vital Signs BP 124/76 (BP Location: Left Arm)   Pulse 94   Temp 98.4 F (36.9 C) (Oral)   Resp 18   SpO2 100%   Physical Exam  Constitutional: She is oriented to person, place, and time. She appears well-developed and well-nourished. No distress.  HENT:  Head: Normocephalic and atraumatic.  Eyes: Right eye exhibits no discharge. Left eye exhibits no discharge.  Pulmonary/Chest: Effort normal. No respiratory distress.  Abdominal: Soft. Bowel sounds are normal. She exhibits no distension and no mass. There is no tenderness. There is no guarding.  No CVA tenderness  Genitourinary:  Genitourinary Comments: Chaperone present for pelvic exam No external genital lesions Small amount of whitish gray  discharge in the vaginal vault and present at the cervical loss, cervical loss is closed, no cervical friability, no cervical motion tenderness, adnexal tenderness or masses on bimanual exam.  Musculoskeletal: She exhibits no edema or deformity.  Neurological: She is alert and oriented to person, place, and time. Coordination normal.  Skin: Skin is warm and dry. She is not diaphoretic.  Psychiatric: She has a normal mood and affect. Her behavior is normal.  Nursing note and vitals reviewed.    ED Treatments / Results  Labs (all labs ordered are listed, but only abnormal results are displayed) Labs  Reviewed  WET PREP, GENITAL - Abnormal; Notable for the following components:      Result Value   Clue Cells Wet Prep HPF POC PRESENT (*)    WBC, Wet Prep HPF POC MANY (*)    All other components within normal limits  POC URINE PREG, ED  GC/CHLAMYDIA PROBE AMP ([place]) NOT AT [facility]    EKG  EKG Interpretation None       Radiology No results found.  Procedures Procedures (including critical care time)  Medications Ordered in ED Medications - No data to display   Initial Impression / Assessment and Plan / ED Course  I have reviewed the triage vital signs and the nursing notes.  Pertinent labs & imaging results that were available during my care of the patient were reviewed by me and considered in my medical decision making (see chart for details).  Patient presents with 3 days of vaginal odor and vaginal discharge.  No associated fevers, abdominal pain, nausea vomiting, or dysuria.  Vitals are normal and patient is overall well-appearing.  Abdomen benign.  Small amount of whitish gray discharge on vaginal exam, wet prep and GC chlamydia pending.  Given the patient has not recently been sexually active, do not feel the need to prophylactically treat at this time, patient recently had previous negative STD testing.  Wet prep shows WBCs and clue cells, will treat for BV with Flagyl.  Patient is aware she has GC and Chlamydia testing pending and will be contacted in 2-3 days if any of these results are positive.  At this time patient is stable for discharge, no acute distress.  Return precautions discussed.  Patient to follow-up with her PCP.  Final Clinical Impressions(s) / ED Diagnoses   Final diagnoses:  Vaginal discharge  BV (bacterial vaginosis)    ED Discharge Orders        Ordered    metroNIDAZOLE (FLAGYL) 500 MG tablet  2 times daily     09/01/17 1028       Legrand RamsFord, Kristell Wooding N, PA-C 09/01/17 1031    Azalia Bilisampos, Kevin, MD 09/02/17 0930

## 2017-09-01 NOTE — ED Triage Notes (Signed)
Patient here from home with complaints of vaginal odor x3 days. Reports discharge, yellow/green. Denies pregnancy. Denies abdominal pain.

## 2017-09-06 ENCOUNTER — Encounter: Payer: Self-pay | Admitting: Obstetrics and Gynecology

## 2017-09-06 ENCOUNTER — Ambulatory Visit: Payer: Self-pay | Admitting: Obstetrics and Gynecology

## 2017-09-06 NOTE — Progress Notes (Signed)
Patient did not keep GYN appointment for 09/06/2017.  Jeanne Chaney, Jr MD Attending Center for [facility](Faculty Practice)

## 2017-09-13 ENCOUNTER — Encounter (INDEPENDENT_AMBULATORY_CARE_PROVIDER_SITE_OTHER): Payer: Self-pay | Admitting: Physician Assistant

## 2017-09-13 ENCOUNTER — Other Ambulatory Visit: Payer: Self-pay

## 2017-09-13 ENCOUNTER — Ambulatory Visit (INDEPENDENT_AMBULATORY_CARE_PROVIDER_SITE_OTHER): Payer: Self-pay | Admitting: Physician Assistant

## 2017-09-13 VITALS — BP 124/86 | HR 95 | Temp 99.1°F | Wt 235.0 lb

## 2017-09-13 DIAGNOSIS — N898 Other specified noninflammatory disorders of vagina: Secondary | ICD-10-CM

## 2017-09-13 DIAGNOSIS — G43809 Other migraine, not intractable, without status migrainosus: Secondary | ICD-10-CM

## 2017-09-13 MED ORDER — FLUCONAZOLE 150 MG PO TABS: 150.0000 mg | ORAL_TABLET | ORAL | 0 refills | Status: DC

## 2017-09-13 MED ORDER — SUMATRIPTAN SUCCINATE 25 MG PO TABS: 25.0000 mg | ORAL_TABLET | Freq: Every day | ORAL | 0 refills | Status: DC

## 2017-09-13 NOTE — Patient Instructions (Signed)

## 2017-09-13 NOTE — Progress Notes (Signed)
Subjective:  Patient ID: Jeanne Chaney, female    DOB: 1991/01/17  Age: 27 y.o. MRN: [id number]  CC: white vaginal discharge  HPI Jeanne Chaney is a 27 y.o. female with a medical history of fungal and bacterial vaginitis presents with mild vagina itching with white discharge x2 days. No odor. Had BV treatment on week ago.     Headache on right side of head to include periorbital pain. Waxes and wanes over the last two weeks. Lasts a few hours. Has felt increasing stress due to reduced hours at work. Aggravated with light and noise. Has taken Excedrin with partial relief. Does not endorse tingling, numbness, weakness, f/c/n/v, visual blurring,      Outpatient Medications Prior to Visit  Medication Sig Dispense Refill  . Etonogestrel (IMPLANON ) Inject 1 application into the skin once.    Marland Kitchen acyclovir (ZOVIRAX) 400 MG tablet Take 1 tablet (400 mg total) by mouth 2 (two) times daily. (Patient not taking: Reported on 09/13/2017) 60 tablet 5  . fluconazole (DIFLUCAN) 150 MG tablet Take 1 tablet (150 mg total) by mouth once a week. (Patient not taking: Reported on 09/13/2017) 2 tablet 0  . Probiotic Product (PHILLIPS COLON HEALTH) CAPS Take 1 capsule by mouth 2 (two) times daily. (Patient not taking: Reported on 09/13/2017) 60 capsule 0  . metroNIDAZOLE (FLAGYL) 500 MG tablet Take 1 tablet (500 mg total) by mouth 2 (two) times daily. One po bid x 7 days 14 tablet 0   No facility-administered medications prior to visit.      ROS Review of Systems  Constitutional: Negative for chills, fever and malaise/fatigue.  Eyes: Negative for blurred vision.  Respiratory: Negative for shortness of breath.   Cardiovascular: Negative for chest pain and palpitations.  Gastrointestinal: Negative for abdominal pain and nausea.  Genitourinary: Negative for dysuria and hematuria.       Vaginal discharge  Musculoskeletal: Negative for joint pain and myalgias.  Skin: Negative for rash.  Neurological: Positive  for headaches. Negative for tingling.  Psychiatric/Behavioral: Negative for depression. The patient is not nervous/anxious.     Objective:  BP 124/86 (BP Location: Left Arm, Patient Position: Sitting, Cuff Size: Large)   Pulse 95   Temp 99.1 F (37.3 C) (Oral)   Wt 235 lb (106.6 kg)   SpO2 98%   BMI 36.81 kg/m   BP/Weight 09/13/2017 09/01/2017 07/19/2017  Systolic BP 124 134 134  Diastolic BP 86 91 80  Wt. (Lbs) 235 - -  BMI 36.81 - -  Some encounter information is confidential and restricted. Go to Review Flowsheets activity to see all data.      Physical Exam  Constitutional: She is oriented to person, place, and time.  Well developed, overweight, NAD, polite  HENT:  Head: Normocephalic and atraumatic.  Mouth/Throat: No oropharyngeal exudate.  Eyes: Conjunctivae and EOM are normal. Pupils are equal, round, and reactive to light. No scleral icterus.  Neck: Normal range of motion. Neck supple. No thyromegaly present.  Cardiovascular: Normal rate, regular rhythm and normal heart sounds.  Pulmonary/Chest: Effort normal and breath sounds normal.  Musculoskeletal: She exhibits no edema.  Lymphadenopathy:    She has no cervical adenopathy.  Neurological: She is alert and oriented to person, place, and time. No cranial nerve deficit. Coordination normal.  Skin: Skin is warm and dry. No rash noted. No erythema. No pallor.  Psychiatric: She has a normal mood and affect. Her behavior is normal. Thought content normal.  Vitals reviewed.  Assessment & Plan:   1. Other migraine without status migrainosus, not intractable - Begin SUMAtriptan (IMITREX) 25 MG tablet; Take 1 tablet (25 mg total) by mouth daily. May take one more tablet two hours after the first. No more than two tablets per day.  Dispense: 15 tablet; Refill: 0  2. Vaginal discharge - Begin fluconazole (DIFLUCAN) 150 MG tablet; Take 1 tablet (150 mg total) by mouth once a week.  Dispense: 2 tablet; Refill:  0   Meds ordered this encounter  Medications  . fluconazole (DIFLUCAN) 150 MG tablet    Sig: Take 1 tablet (150 mg total) by mouth once a week.    Dispense:  2 tablet    Refill:  0    Order Specific Question:   Supervising Provider    Answer:   Quentin AngstJEGEDE, OLUGBEMIGA E [id number]]  . SUMAtriptan (IMITREX) 25 MG tablet    Sig: Take 1 tablet (25 mg total) by mouth daily. May take one more tablet two hours after the first. No more than two tablets per day.    Dispense:  15 tablet    Refill:  0    Order Specific Question:   Supervising Provider    Answer:   Quentin AngstJEGEDE, OLUGBEMIGA E [id number]    Follow-up: Return if symptoms worsen or fail to improve.   Loletta Specteroger David Berklie Dethlefs PA

## 2017-09-13 NOTE — Progress Notes (Signed)
Pt complains of having headache at least 3-4 times a week, if she takes medication it takes an hour or more for the pain to subside

## 2017-10-12 ENCOUNTER — Ambulatory Visit (INDEPENDENT_AMBULATORY_CARE_PROVIDER_SITE_OTHER): Payer: Self-pay | Admitting: Physician Assistant

## 2017-10-12 ENCOUNTER — Encounter (INDEPENDENT_AMBULATORY_CARE_PROVIDER_SITE_OTHER): Payer: Self-pay | Admitting: Physician Assistant

## 2017-10-12 ENCOUNTER — Other Ambulatory Visit (INDEPENDENT_AMBULATORY_CARE_PROVIDER_SITE_OTHER): Payer: Self-pay | Admitting: Physician Assistant

## 2017-10-12 VITALS — BP 124/75 | HR 97 | Temp 98.5°F | Resp 18 | Ht 67.0 in | Wt 236.0 lb

## 2017-10-12 DIAGNOSIS — N898 Other specified noninflammatory disorders of vagina: Secondary | ICD-10-CM

## 2017-10-12 DIAGNOSIS — Z79899 Other long term (current) drug therapy: Secondary | ICD-10-CM

## 2017-10-12 LAB — POCT URINALYSIS DIPSTICK
Glucose: NEGATIVE
Ketones, UA: NEGATIVE
LEUKOCYTES UA: NEGATIVE
NITRITE UA: NEGATIVE
PH UA: 6.5 (ref 5.0–8.0)
PROTEIN UA: 30
RBC UA: NEGATIVE
SPEC GRAV UA: 1.02 (ref 1.010–1.025)
UROBILINOGEN UA: 0.2 U/dL

## 2017-10-12 LAB — POCT URINE PREGNANCY: PREG TEST UR: NEGATIVE

## 2017-10-12 MED ORDER — METRONIDAZOLE 500 MG PO TABS: 500.0000 mg | ORAL_TABLET | Freq: Two times a day (BID) | ORAL | 0 refills | Status: AC

## 2017-10-12 MED ORDER — FLUCONAZOLE 150 MG PO TABS: 150.0000 mg | ORAL_TABLET | Freq: Once | ORAL | 0 refills | Status: AC

## 2017-10-12 NOTE — Addendum Note (Signed)
Addended by: Margaretmary LombardLISBON, Judit Awad K on: 10/12/2017 11:48 AM   Modules accepted: Orders

## 2017-10-12 NOTE — Patient Instructions (Addendum)
Safe Sex Practicing safe sex means taking steps before and during sex to reduce your risk of:  Getting an STD (sexually transmitted disease).  Giving your partner an STD.  Unwanted pregnancy.  How can I practice safe sex?  To practice safe sex:  Limit your sexual partners to only one partner who is having sex with only you.  Avoid using alcohol and recreational drugs before having sex. These substances can affect your judgment.  Before having sex with a new partner: ? Talk to your partner about past partners, past STDs, and drug use. ? You and your partner should be screened for STDs and discuss the results with each other.  Check your body regularly for sores, blisters, rashes, or unusual discharge. If you notice any of these problems, visit your health care provider.  If you have symptoms of an infection or you are being treated for an STD, avoid sexual contact.  While having sex, use a condom. Make sure to: ? Use a condom every time you have vaginal, oral, or anal sex. Both females and males should wear condoms during oral sex. ? Keep condoms in place from the beginning to the end of sexual activity. ? Use a latex condom, if possible. Latex condoms offer the best protection. ? Use only water-based lubricants or oils to lubricate a condom. Using petroleum-based lubricants or oils will weaken the condom and increase the chance that it will break.  See your health care provider for regular screenings, exams, and tests for STDs.  Talk with your health care provider about the form of birth control (contraception) that is best for you.  Get vaccinated against hepatitis B and human papillomavirus (HPV).  If you are at risk of being infected with HIV (human immunodeficiency virus), talk with your health care provider about taking a prescription medicine to prevent HIV infection. You are considered at risk for HIV if: ? You are a man who has sex with other men. ? You are a  heterosexual man or woman who is sexually active with more than one partner. ? You take drugs by injection. ? You are sexually active with a partner who has HIV.  This information is not intended to replace advice given to you by your health care provider. Make sure you discuss any questions you have with your health care provider. Document Released: 09/14/2004 Document Revised: 12/22/2015 Document Reviewed: 06/27/2015 Elsevier Interactive Patient Education  2018 [employer]. Chlamydia Test Why am I having this test? This a test to see if you have chlamydia. Chlamydia is a common sexually transmitted disease (STD). Your health care provider may perform this test if you:  Are sexually active.  Have another STD.  Have complaints about pelvic pain, vaginal discharge, or both.  What kind of sample is taken? Depending on your symptoms, your health care provider may collect any one of the following samples:  A blood sample. This is usually collected by inserting a needle into a vein.  A tissue sample. This is collected by swabbing tissue of the eye, urethra, or cervix.  A sample of sputum. This is collected by having you cough into a sterile container that is provided by the lab.  How do I prepare for this test? There is no preparation required for this test. How are the results reported? Your test results will be reported as either positive or negative. It is your responsibility to obtain your test results. Ask the lab or department performing the test when and how  you will get your results. What do the results mean? A positive result means that you have a chlamydia infection. Talk with your health care provider to discuss your results, treatment options, and if necessary, the need for more tests. Talk with your health care provider if you have any questions about your results. Talk with your health care provider to discuss your results, treatment options, and if necessary, the need for  more tests. Talk with your health care provider if you have any questions about your results. This information is not intended to replace advice given to you by your health care provider. Make sure you discuss any questions you have with your health care provider. Document Released: 08/30/2004 Document Revised: 04/11/2016 Document Reviewed: 12/31/2013 Elsevier Interactive Patient Education  [employer]2018 Elsevier Inc.

## 2017-10-12 NOTE — Progress Notes (Signed)
Scratchy throat beginning yesterday. Discharge with light intermittent odor, no itching, burning or known STD exposure.

## 2017-10-12 NOTE — Progress Notes (Signed)
Subjective:  Patient ID: Jeanne Chaney, female    DOB: [birth year]  Age: 27 y.o. MRN: [id number]  CC: vaginal discharge  HPI Michaella Terrilee CroakKnight is a 27 y.o. 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 Onset after sexual intercourse. Female partner did not communicate symptoms. Pt's vaginal discharge is described as yellowish with no odor. No associated burning, itching, pelvic pain, genital lesions, dysuria, flank pain, back pain, or f/c/n/v.       Outpatient Medications Prior to Visit  Medication Sig Dispense Refill  . Etonogestrel (IMPLANON [place]) Inject 1 application into the skin once.    . SUMAtriptan (IMITREX) 25 MG tablet Take 1 tablet (25 mg total) by mouth daily. May take one more tablet two hours after the first. No more than two tablets per day. 15 tablet 0  . fluconazole (DIFLUCAN) 150 MG tablet Take 1 tablet (150 mg total) by mouth once a week. 2 tablet 0   No facility-administered medications prior to visit.      ROS Review of Systems  Constitutional: Negative for chills, fever and malaise/fatigue.  Eyes: Negative for blurred vision.  Respiratory: Negative for shortness of breath.   Cardiovascular: Negative for chest pain and palpitations.  Gastrointestinal: Negative for abdominal pain and nausea.  Genitourinary: Negative for dysuria and hematuria.  Musculoskeletal: Negative for joint pain and myalgias.  Skin: Negative for rash.  Neurological: Negative for tingling and headaches.  Psychiatric/Behavioral: Negative for depression. The patient is not nervous/anxious.     Objective:  BP 124/75 (BP Location: Left Arm, Patient Position: Sitting, Cuff Size: Normal)   Pulse 97   Temp 98.5 F (36.9 C) (Oral)   Resp 18   Ht 5\' 7"  (1.702 m)   Wt 236 lb (107 kg)   LMP 10/05/2017   SpO2 97%   BMI 36.96 kg/m   BP/Weight 10/12/2017 09/13/2017 09/01/2017  Systolic BP 124 124 134  Diastolic BP  75 86 91  Wt. (Lbs) 236 235 -  BMI 36.96 36.81 -  Some encounter information is confidential and restricted. Go to Review Flowsheets activity to see all data.      Physical Exam  Constitutional: She is oriented to person, place, and time.  Well developed, well nourished, NAD, polite  HENT:  Head: Normocephalic and atraumatic.  Eyes: No scleral icterus.  Pulmonary/Chest: Effort normal and breath sounds normal.  Abdominal: Soft. Bowel sounds are normal. There is no tenderness.  Genitourinary:  Genitourinary Comments: Thin yellowish vaginal discharge with no odor. Cervix erythematous but with no motion tenderness. No adnexal tenderness or mass bilaterally  Neurological: She is alert and oriented to person, place, and time.  Skin: Skin is warm and dry. No rash noted. No erythema. No pallor.  Psychiatric: She has a normal mood and affect. Her behavior is normal. Thought content normal.  Vitals reviewed.    Assessment & Plan:   1. Vaginal discharge - Urinalysis Dipstick negative  - GC/Chlamydia Probe Amp([facility]) - metroNIDAZOLE (FLAGYL) 500 MG tablet; Take 1 tablet (500 mg total) by mouth 2 (two) times daily for 7 days.  Dispense: 14 tablet; Refill: 0  2. High risk medication use - POCT urine pregnancy negative - fluconazole (DIFLUCAN) 150 MG tablet; Take 1 tablet (150 mg total) by mouth once for 1 dose.  Dispense: 1 tablet; Refill: 0   Meds ordered this encounter  Medications  . metroNIDAZOLE (FLAGYL) 500 MG tablet    Sig: Take  1 tablet (500 mg total) by mouth 2 (two) times daily for 7 days.    Dispense:  14 tablet    Refill:  0    Order Specific Question:   Supervising Provider    Answer:   Quentin Angst [id number]  . fluconazole (DIFLUCAN) 150 MG tablet    Sig: Take 1 tablet (150 mg total) by mouth once for 1 dose.    Dispense:  1 tablet    Refill:  0    Order Specific Question:   Supervising Provider    Answer:   Quentin Angst [[id number]]    Follow-up:  No Follow-up on file.   Loletta Specter PA

## 2017-10-16 LAB — BACTERIAL VAGINOSIS, NAA
ATOPOBIUM VAGINAE: HIGH {score} — AB
BVAB 2: HIGH Score — AB
MEGASPHAERA 1: HIGH {score} — AB

## 2017-10-16 LAB — GC/CHLAMYDIA PROBE AMP
CHLAMYDIA, DNA PROBE: NEGATIVE
Neisseria gonorrhoeae by PCR: NEGATIVE

## 2017-10-16 LAB — TRICHOMONAS VAGINALIS, PROBE AMP: Trich vag by NAA: NEGATIVE

## 2017-10-22 LAB — SPECIMEN STATUS REPORT

## 2017-10-22 LAB — C ALBICANS + C GLABRATA, NAA
Candida albicans, NAA: NEGATIVE
Candida glabrata, NAA: NEGATIVE

## 2017-10-24 ENCOUNTER — Other Ambulatory Visit (INDEPENDENT_AMBULATORY_CARE_PROVIDER_SITE_OTHER): Payer: Self-pay | Admitting: Physician Assistant

## 2017-10-24 DIAGNOSIS — N760 Acute vaginitis: Principal | ICD-10-CM

## 2017-10-24 DIAGNOSIS — B9689 Other specified bacterial agents as the cause of diseases classified elsewhere: Secondary | ICD-10-CM

## 2017-10-24 MED ORDER — METRONIDAZOLE 500 MG PO TABS: 500.0000 mg | ORAL_TABLET | Freq: Two times a day (BID) | ORAL | 0 refills | Status: AC

## 2017-10-25 ENCOUNTER — Telehealth (INDEPENDENT_AMBULATORY_CARE_PROVIDER_SITE_OTHER): Payer: Self-pay

## 2017-10-25 NOTE — Telephone Encounter (Signed)
-----   Message from Loletta Specteroger David Gomez, PA-C sent at 10/24/2017  5:56 PM EST ----- Yeast, Chlamydia, Gonorrhea negative. BV positive. [facility] [place]

## 2017-10-25 NOTE — Telephone Encounter (Signed)
Patient aware of negative yeast, chlamydia and gonorrhea. BV positive, metronidazole sent to [facility] pick up and take as directed. Maryjean Mornempestt S Roberts, CMA

## 2017-10-30 ENCOUNTER — Other Ambulatory Visit (INDEPENDENT_AMBULATORY_CARE_PROVIDER_SITE_OTHER): Payer: Self-pay | Admitting: Physician Assistant

## 2018-03-27 IMAGING — CR DG HAND COMPLETE 3+V*R*
3 series · 3 of 3 positions shown · non-contrast
Comparison: No prior.

CLINICAL DATA: Tenderness.  Initial evaluation .

EXAM:
RIGHT HAND - COMPLETE 3+ VIEW

[hand pa]
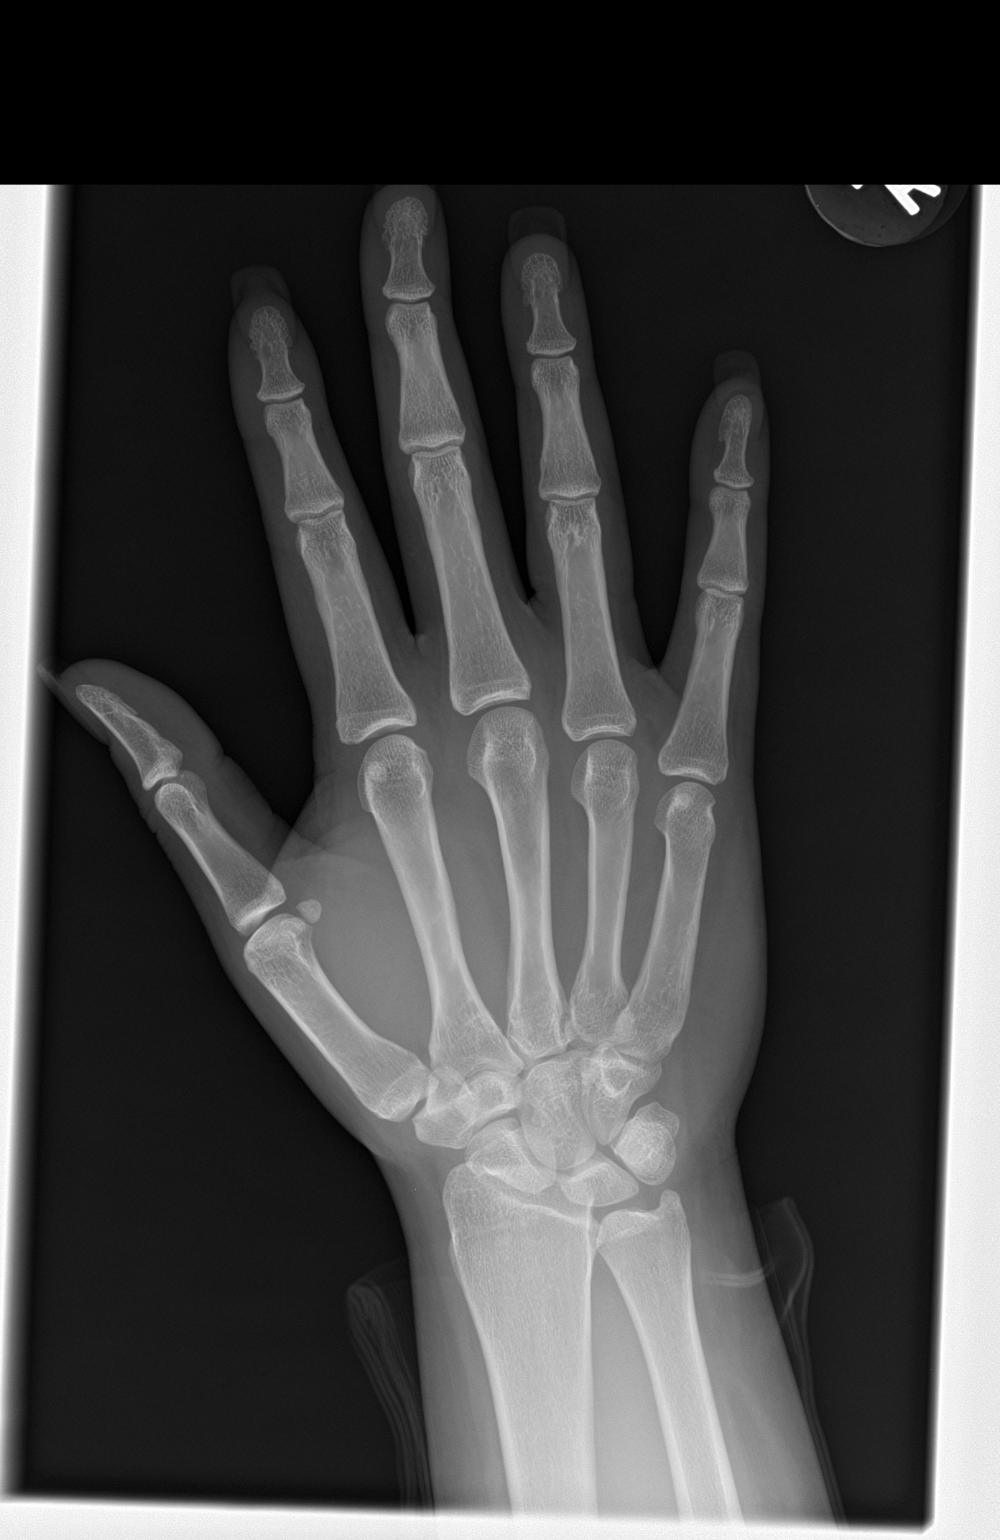

[hand obl]
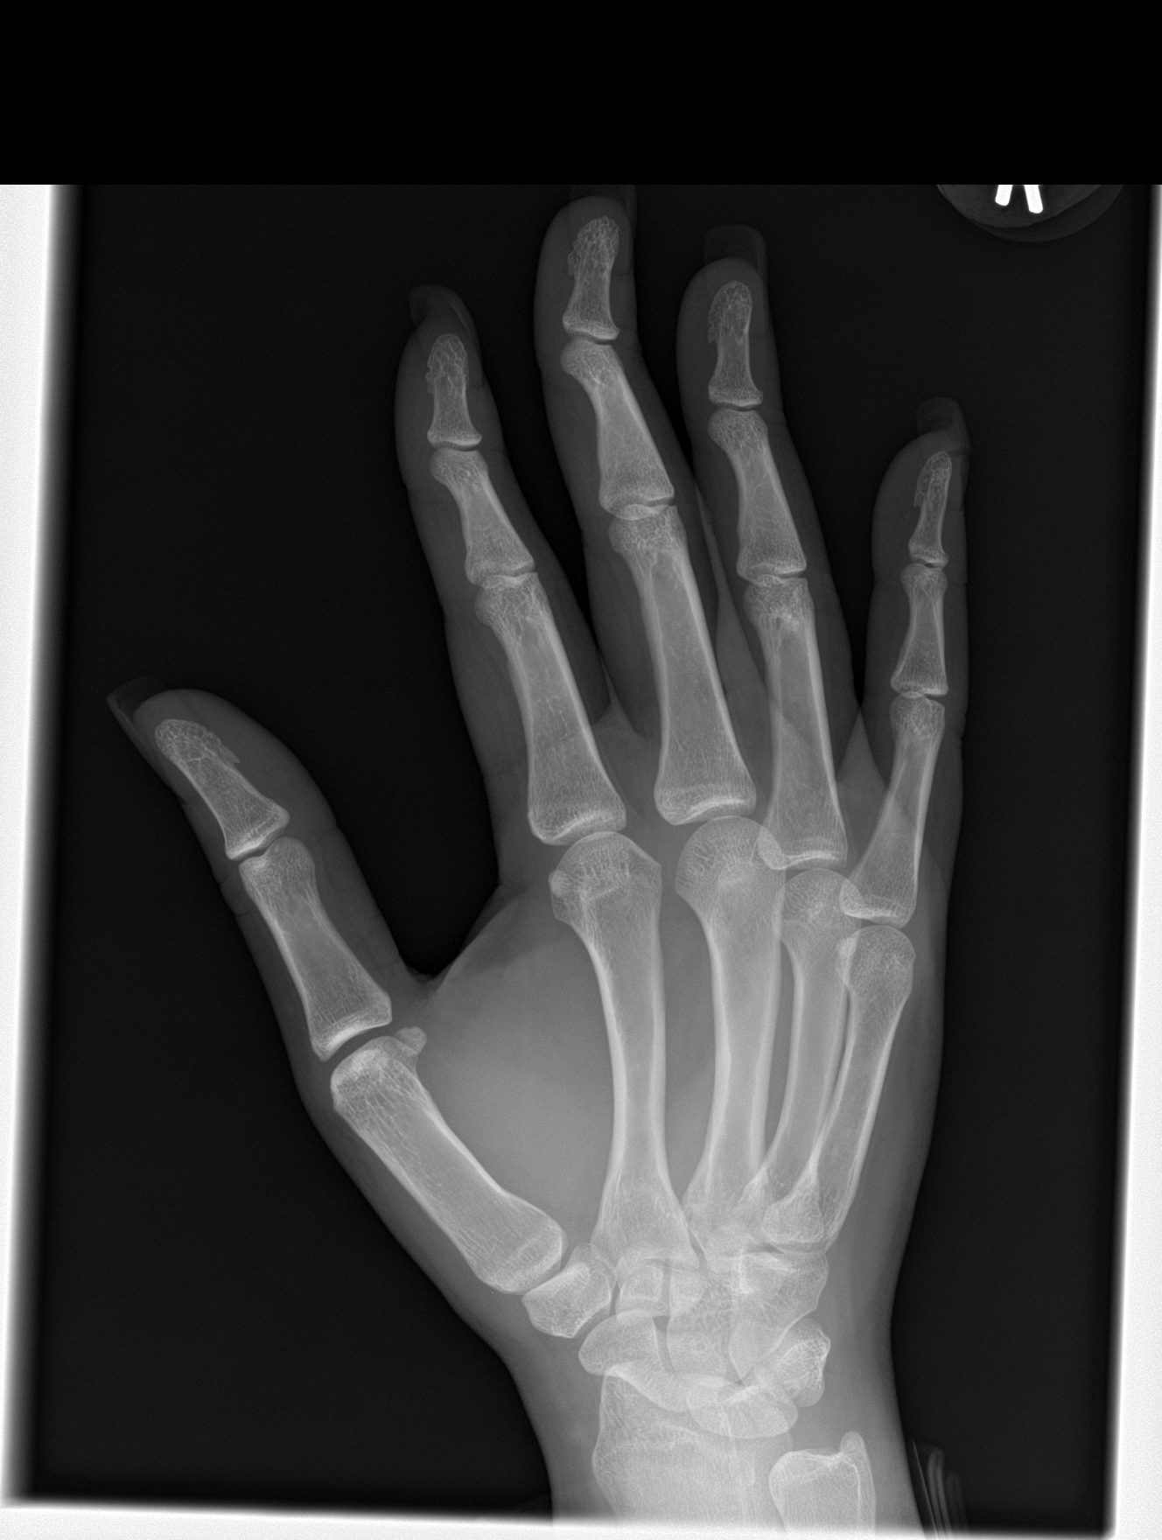

[hand lat]
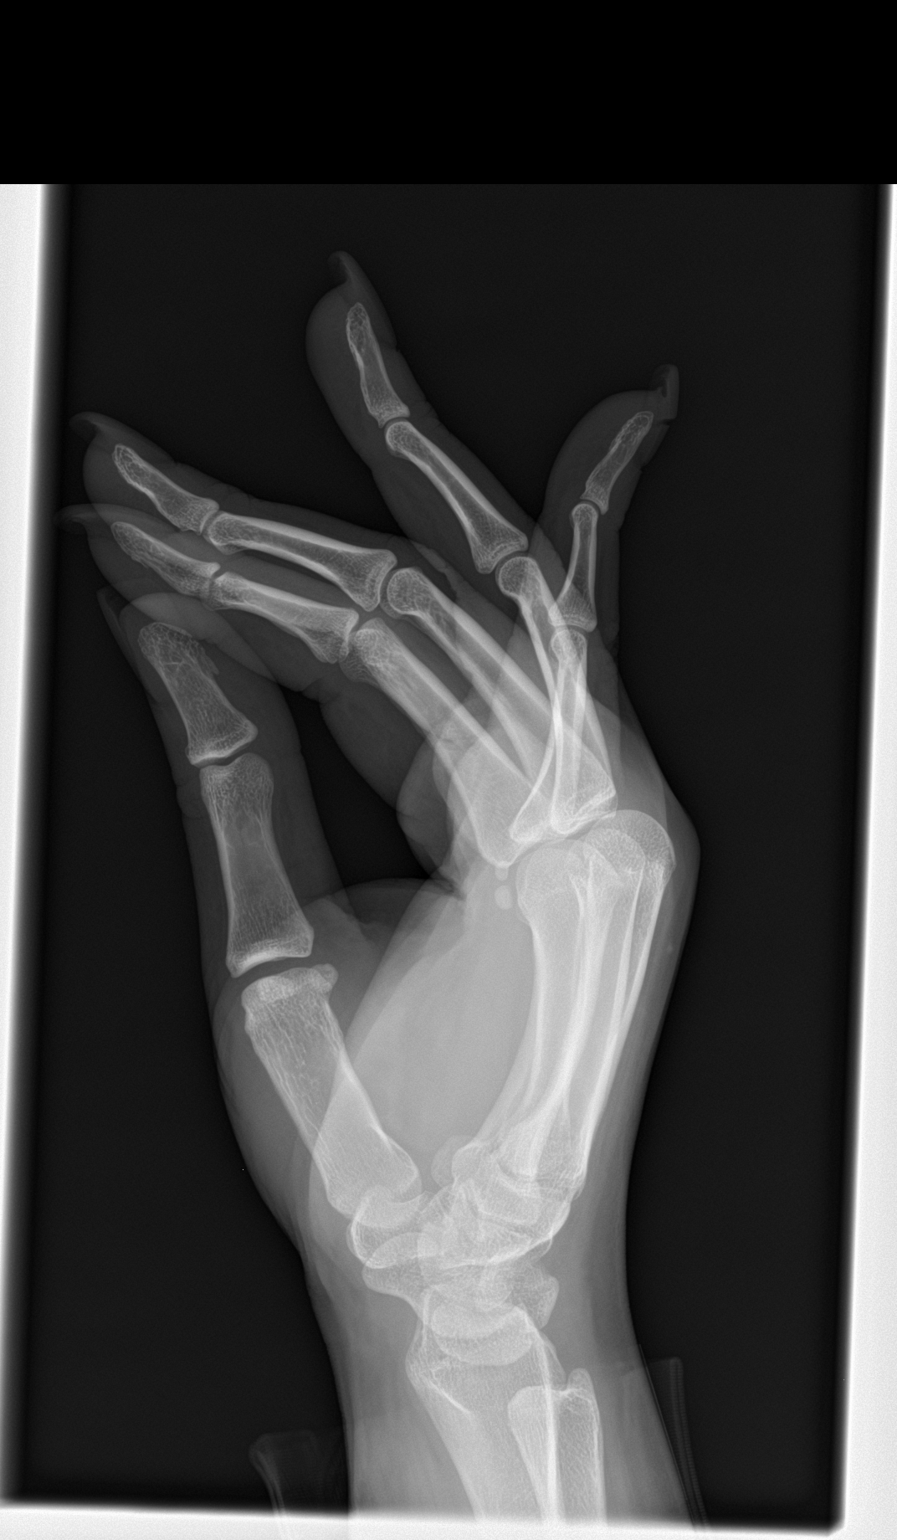

[3 of 3 positions shown; findings below may reference images not displayed]

FINDINGS: No acute soft tissue bony or joint abnormality identified. No
radiopaque foreign body.
IMPRESSION: Negative exam.

## 2018-05-28 ENCOUNTER — Other Ambulatory Visit: Payer: Self-pay

## 2018-05-28 ENCOUNTER — Encounter (INDEPENDENT_AMBULATORY_CARE_PROVIDER_SITE_OTHER): Payer: Self-pay | Admitting: Physician Assistant

## 2018-05-28 ENCOUNTER — Ambulatory Visit (INDEPENDENT_AMBULATORY_CARE_PROVIDER_SITE_OTHER): Payer: Self-pay | Admitting: Physician Assistant

## 2018-05-28 VITALS — BP 119/76 | HR 98 | Temp 97.9°F | Ht 67.0 in | Wt 235.4 lb

## 2018-05-28 DIAGNOSIS — G43809 Other migraine, not intractable, without status migrainosus: Secondary | ICD-10-CM

## 2018-05-28 MED ORDER — SUMATRIPTAN SUCCINATE 25 MG PO TABS: 25.0000 mg | ORAL_TABLET | Freq: Every day | ORAL | 1 refills | Status: DC

## 2018-05-28 MED ORDER — NORTRIPTYLINE HCL 25 MG PO CAPS: 25.0000 mg | ORAL_CAPSULE | Freq: Every day | ORAL | 1 refills | Status: DC

## 2018-05-28 NOTE — Patient Instructions (Signed)
Migraine Headache A migraine headache is an intense, throbbing pain on one side or both sides of the head. Migraines may also cause other symptoms, such as nausea, vomiting, and sensitivity to light and noise. What are the causes? Doing or taking certain things may also trigger migraines, such as:  Alcohol.  Smoking.  Medicines, such as: ? Medicine used to treat chest pain (nitroglycerine). ? Birth control pills. ? Estrogen pills. ? Certain blood pressure medicines.  Aged cheeses, chocolate, or caffeine.  Foods or drinks that contain nitrates, glutamate, aspartame, or tyramine.  Physical activity.  Other things that may trigger a migraine include:  Menstruation.  Pregnancy.  Hunger.  Stress, lack of sleep, too much sleep, or fatigue.  Weather changes.  What increases the risk? The following factors may make you more likely to experience migraine headaches:  Age. Risk increases with age.  Family history of migraine headaches.  Being Caucasian.  Depression and anxiety.  Obesity.  Being a woman.  Having a hole in the heart (patent foramen ovale) or other heart problems.  What are the signs or symptoms? The main symptom of this condition is pulsating or throbbing pain. Pain may:  Happen in any area of the head, such as on one side or both sides.  Interfere with daily activities.  Get worse with physical activity.  Get worse with exposure to bright lights or loud noises.  Other symptoms may include:  Nausea.  Vomiting.  Dizziness.  General sensitivity to bright lights, loud noises, or smells.  Before you get a migraine, you may get warning signs that a migraine is developing (aura). An aura may include:  Seeing flashing lights or having blind spots.  Seeing bright spots, halos, or zigzag lines.  Having tunnel vision or blurred vision.  Having numbness or a tingling feeling.  Having trouble talking.  Having muscle weakness.  How is this  diagnosed? A migraine headache can be diagnosed based on:  Your symptoms.  A physical exam.  Tests, such as CT scan or MRI of the head. These imaging tests can help rule out other causes of headaches.  Taking fluid from the spine (lumbar puncture) and analyzing it (cerebrospinal fluid analysis, or CSF analysis).  How is this treated? A migraine headache is usually treated with medicines that:  Relieve pain.  Relieve nausea.  Prevent migraines from coming back.  Treatment may also include:  Acupuncture.  Lifestyle changes like avoiding foods that trigger migraines.  Follow these instructions at home: Medicines  Take over-the-counter and prescription medicines only as told by your health care provider.  Do not drive or use heavy machinery while taking prescription pain medicine.  To prevent or treat constipation while you are taking prescription pain medicine, your health care provider may recommend that you: ? Drink enough fluid to keep your urine clear or pale yellow. ? Take over-the-counter or prescription medicines. ? Eat foods that are high in fiber, such as fresh fruits and vegetables, whole grains, and beans. ? Limit foods that are high in fat and processed sugars, such as fried and sweet foods. Lifestyle  Avoid alcohol use.  Do not use any products that contain nicotine or tobacco, such as cigarettes and e-cigarettes. If you need help quitting, ask your health care provider.  Get at least 8 hours of sleep every night.  Limit your stress. General instructions   Keep a journal to find out what may trigger your migraine headaches. For example, write down: ? What you eat and   drink. ? How much sleep you get. ? Any change to your diet or medicines.  If you have a migraine: ? Avoid things that make your symptoms worse, such as bright lights. ? It may help to lie down in a dark, quiet room. ? Do not drive or use heavy machinery. ? Ask your health care provider  what activities are safe for you while you are experiencing symptoms.  Keep all follow-up visits as told by your health care provider. This is important. Contact a health care provider if:  You develop symptoms that are different or more severe than your usual migraine symptoms. Get help right away if:  Your migraine becomes severe.  You have a fever.  You have a stiff neck.  You have vision loss.  Your muscles feel weak or like you cannot control them.  You start to lose your balance often.  You develop trouble walking.  You faint. This information is not intended to replace advice given to you by your health care provider. Make sure you discuss any questions you have with your health care provider. Document Released: 08/07/2005 Document Revised: 02/25/2016 Document Reviewed: 01/24/2016 Elsevier Interactive Patient Education  2017 Elsevier Inc.   

## 2018-05-28 NOTE — Progress Notes (Signed)
Subjective:  Patient ID: Jeanne Chaney, female    DOB: 24-Apr-1991  Age: 27 y.o. MRN: [id number]  CC: headaches  HPI Jeanne Chaney is a 27 y.o. female with a medical history of HSVII, trichomoniasis, and yeast infection presents with headaches since three weeks ago. Has a headache approximately three times per day. Self resolves during the day and returns. Felt at the maxillary sinus bilaterally but not at the same time. Alternates sides. Sometimes in the frontal sinus. There is also associated eye watering, photophobia, phonophobia, and flashing specks of light. Was previously diagnosed with migraine headache and prescribed Sumatriptan but she did not go fill. Denies current headache, CP, palpitations, SOB, abdominal pain, fever, chills, rash, swelling, and GI/GU sxs.     Outpatient Medications Prior to Visit  Medication Sig Dispense Refill  . Etonogestrel (IMPLANON [place]) Inject 1 application into the skin once.    . SUMAtriptan (IMITREX) 25 MG tablet Take 1 tablet (25 mg total) by mouth daily. May take one more tablet two hours after the first. No more than two tablets per day. 15 tablet 0   No facility-administered medications prior to visit.      ROS Review of Systems  Constitutional: Negative for chills, fever and malaise/fatigue.  Eyes: Negative for blurred vision.  Respiratory: Negative for shortness of breath.   Cardiovascular: Negative for chest pain and palpitations.  Gastrointestinal: Negative for abdominal pain and nausea.  Genitourinary: Negative for dysuria and hematuria.  Musculoskeletal: Negative for joint pain and myalgias.  Skin: Negative for rash.  Neurological: Positive for headaches. Negative for tingling.  Psychiatric/Behavioral: Negative for depression. The patient is not nervous/anxious.     Objective:  BP 119/76 (BP Location: Left Arm, Patient Position: Sitting, Cuff Size: Large)   Pulse 98   Temp 97.9 F (36.6 C) (Oral)   Ht 5\' 7"  (1.702 m)   Wt 235 lb  6.4 oz (106.8 kg)   LMP 05/24/2018 (Exact Date)   SpO2 98%   BMI 36.87 kg/m   BP/Weight 05/28/2018 10/12/2017 09/13/2017  Systolic BP 119 124 124  Diastolic BP 76 75 86  Wt. (Lbs) 235.4 236 235  BMI 36.87 36.96 36.81  Some encounter information is confidential and restricted. Go to Review Flowsheets activity to see all data.      Physical Exam  Constitutional: She is oriented to person, place, and time.  Well developed, well nourished, NAD, polite  HENT:  Head: Normocephalic and atraumatic.  No maxillary or frontal sinus tenderness to palpation.   Eyes: Pupils are equal, round, and reactive to light. No scleral icterus.  Neck: Normal range of motion. Neck supple. No thyromegaly present.  Cardiovascular: Normal rate, regular rhythm and normal heart sounds.  Pulmonary/Chest: Effort normal and breath sounds normal.  Abdominal: Soft. Bowel sounds are normal. There is no tenderness.  Musculoskeletal: She exhibits no edema.  Neurological: She is alert and oriented to person, place, and time. No cranial nerve deficit. Coordination normal.  Skin: Skin is warm and dry. No rash noted. No erythema. No pallor.  Psychiatric: She has a normal mood and affect. Her behavior is normal. Thought content normal.  Vitals reviewed.    Assessment & Plan:    1. Other migraine without status migrainosus, not intractable - SUMAtriptan (IMITREX) 25 MG tablet; Take 1 tablet (25 mg total) by mouth daily. May take one more tablet two hours after the first. No more than two tablets per day.  Dispense: 15 tablet; Refill: 1   Meds ordered  this encounter  Medications  . SUMAtriptan (IMITREX) 25 MG tablet    Sig: Take 1 tablet (25 mg total) by mouth daily. May take one more tablet two hours after the first. No more than two tablets per day.    Dispense:  15 tablet    Refill:  1    Order Specific Question:   Supervising Provider    Answer:   Hoy Register [[id number]]  . nortriptyline (PAMELOR) 25 MG capsule     Sig: Take 1 capsule (25 mg total) by mouth at bedtime.    Dispense:  30 capsule    Refill:  1    Order Specific Question:   Supervising Provider    Answer:   Hoy Register [[id number]]    Follow-up: Return in about 6 weeks (around 07/09/2018) for Migraines.   Loletta Specter PA

## 2018-07-15 ENCOUNTER — Ambulatory Visit (INDEPENDENT_AMBULATORY_CARE_PROVIDER_SITE_OTHER): Payer: Self-pay | Admitting: Physician Assistant

## 2018-07-15 ENCOUNTER — Encounter (INDEPENDENT_AMBULATORY_CARE_PROVIDER_SITE_OTHER): Payer: Self-pay | Admitting: Physician Assistant

## 2018-07-15 VITALS — BP 120/77 | HR 73 | Temp 98.4°F | Resp 18 | Ht 67.0 in | Wt 237.0 lb

## 2018-07-15 DIAGNOSIS — G8929 Other chronic pain: Secondary | ICD-10-CM

## 2018-07-15 DIAGNOSIS — R51 Headache: Secondary | ICD-10-CM

## 2018-07-15 DIAGNOSIS — M79662 Pain in left lower leg: Secondary | ICD-10-CM

## 2018-07-15 MED ORDER — NAPROXEN 500 MG PO TABS: 500.0000 mg | ORAL_TABLET | Freq: Two times a day (BID) | ORAL | 0 refills | Status: DC

## 2018-07-15 MED ORDER — CYCLOBENZAPRINE HCL 10 MG PO TABS: 10.0000 mg | ORAL_TABLET | Freq: Every day | ORAL | 0 refills | Status: DC

## 2018-07-15 NOTE — Patient Instructions (Signed)
Tension Headache A tension headache is pain, pressure, or aching that is felt over the front and sides of your head. These headaches can last from 30 minutes to several days. Follow these instructions at home: Managing pain  Take over-the-counter and prescription medicines only as told by your doctor.  Lie down in a dark, quiet room when you have a headache.  If directed, apply ice to your head and neck area: ? Put ice in a plastic bag. ? Place a towel between your skin and the bag. ? Leave the ice on for 20 minutes, 2-3 times per day.  Use a heating pad or a hot shower to apply heat to your head and neck area as told by your doctor. Eating and drinking  Eat meals on a regular schedule.  Do not drink a lot of alcohol.  Do not use a lot of caffeine, or stop using caffeine. General instructions  Keep all follow-up visits as told by your doctor. This is important.  Keep a journal to find out if certain things bring on headaches. For example, write down: ? What you eat and drink. ? How much sleep you get. ? Any change to your diet or medicines.  Try getting a massage, or doing other things that help you to relax.  Lessen stress.  Sit up straight. Do not tighten (tense) your muscles.  Do not use tobacco products. This includes cigarettes, chewing tobacco, or e-cigarettes. If you need help quitting, ask your doctor.  Exercise regularly as told by your doctor.  Get enough sleep. This may mean 7-9 hours of sleep. Contact a doctor if:  Your symptoms are not helped by medicine.  You have a headache that feels different from your usual headache.  You feel sick to your stomach (nauseous) or you throw up (vomit).  You have a fever. Get help right away if:  Your headache becomes very bad.  You keep throwing up.  You have a stiff neck.  You have trouble seeing.  You have trouble speaking.  You have pain in your eye or ear.  Your muscles are weak or you lose muscle  control.  You lose your balance or you have trouble walking.  You feel like you will pass out (faint) or you pass out.  You have confusion. This information is not intended to replace advice given to you by your health care provider. Make sure you discuss any questions you have with your health care provider. Document Released: 11/01/2009 Document Revised: 04/06/2016 Document Reviewed: 11/30/2014 Elsevier Interactive Patient Education  2018 Elsevier Inc.  

## 2018-07-15 NOTE — Progress Notes (Signed)
Subjective:  Patient ID: Jeanne Chaney, female    DOB: 14-Mar-1991  Age: 27 y.o. MRN: [id number]  CC: migraine   HPI Jeanne Knightis a 27 y.o.femalewith a medical history of HSVII, trichomoniasis, and yeast infection presents with headaches since approximately 10 weeks ago. Has a headache approximately three times per day. Self resolves during the day and returns. Initially reported pain at the maxillary sinus bilaterally but not at the same time. Alternates sides. Sometimes in the frontal sinus. Headache is now felt in the temporal region bilaterally. Associated with occipital pain and bilateral upper trapezius discomfort. There is also occasional eye watering, photophobia, phonophobia, and flashing specks of light. Says she is stressed about her finances. Was previously diagnosed with migraine headache and prescribed Sumatriptan which she reports does not help at all. Taking Nortriptyline sporadically which helps with sleep but not headaches. Reports having      Patient also states she had a recent trip to [place] last week. Came back on a six and a half hour flight on 07/09/18. Complains of pain and swelling of the left calf since 07/10/18. Pain worse with weight bearing. Denies CP, palpitations, SOB, f/c/n/v, and injury to the LLE.       Outpatient Medications Prior to Visit  Medication Sig Dispense Refill  . Etonogestrel (IMPLANON [place]) Inject 1 application into the skin once.    . nortriptyline (PAMELOR) 25 MG capsule Take 1 capsule (25 mg total) by mouth at bedtime. 30 capsule 1  . SUMAtriptan (IMITREX) 25 MG tablet Take 1 tablet (25 mg total) by mouth daily. May take one more tablet two hours after the first. No more than two tablets per day. (Patient not taking: Reported on 07/15/2018) 15 tablet 1   No facility-administered medications prior to visit.      ROS Review of Systems  Constitutional: Negative for chills, fever and malaise/fatigue.  Eyes: Negative for blurred vision.   Respiratory: Negative for shortness of breath.   Cardiovascular: Negative for chest pain and palpitations.  Gastrointestinal: Negative for abdominal pain and nausea.  Genitourinary: Negative for dysuria and hematuria.  Musculoskeletal: Positive for myalgias (left calf). Negative for joint pain.  Skin: Negative for rash.  Neurological: Positive for headaches. Negative for tingling.  Psychiatric/Behavioral: Negative for depression. The patient is not nervous/anxious.     Objective:  BP 120/77 (BP Location: Left Arm, Patient Position: Sitting, Cuff Size: Large)   Pulse 73   Temp 98.4 F (36.9 C) (Oral)   Resp 18   Ht 5\' 7"  (1.702 m)   Wt 237 lb (107.5 kg)   LMP 07/05/2018   SpO2 99%   BMI 37.12 kg/m   BP/Weight 07/15/2018 05/28/2018 10/12/2017  Systolic BP 120 119 124  Diastolic BP 77 76 75  Wt. (Lbs) 237 235.4 236  BMI 37.12 36.87 36.96  Some encounter information is confidential and restricted. Go to Review Flowsheets activity to see all data.      Physical Exam  Constitutional: She is oriented to person, place, and time.  Well developed,overweight, NAD, polite  HENT:  Head: Normocephalic and atraumatic.  Eyes: Conjunctivae and EOM are normal. No scleral icterus.  Neck: Normal range of motion. Neck supple. No thyromegaly present.  Pulmonary/Chest: Effort normal.  Musculoskeletal: She exhibits no edema.  LLE without edema in comparison to RLE, no erythema, no increased warmth, and no ecchymosis. There is mild TTP of the left mid calf.   Neurological: She is alert and oriented to person, place, and time. No  cranial nerve deficit (grossly nonfocal).  Normal gait, nonantalgic.  Skin: Skin is warm and dry. No rash noted. No erythema. No pallor.  Psychiatric: She has a normal mood and affect. Her behavior is normal. Thought content normal.  Vitals reviewed.    Assessment & Plan:    1. Pain of left calf - VAS [place] LOWER EXTREMITY VENOUS (DVT); Future - D-dimer,  quantitative (not at [facility]) - CBC with Differential - Comprehensive metabolic panel  2. Chronic nonintractable headache, unspecified headache type - Begin naproxen (NAPROSYN) 500 MG tablet; Take 1 tablet (500 mg total) by mouth 2 (two) times daily with a meal.  Dispense: 20 tablet; Refill: 0 - Begin cyclobenzaprine (FLEXERIL) 10 MG tablet; Take 1 tablet (10 mg total) by mouth at bedtime.  Dispense: 10 tablet; Refill: 0 - Suspend Nortriptyline for now. Resume on daily basis only if headache is not relieved with Naproxen, Cyclobenzaprine, and neck stretches which I have personally shown pt how to perform.   Meds ordered this encounter  Medications  . naproxen (NAPROSYN) 500 MG tablet    Sig: Take 1 tablet (500 mg total) by mouth 2 (two) times daily with a meal.    Dispense:  20 tablet    Refill:  0    Order Specific Question:   Supervising Provider    Answer:   Hoy Register [[id number]]  . cyclobenzaprine (FLEXERIL) 10 MG tablet    Sig: Take 1 tablet (10 mg total) by mouth at bedtime.    Dispense:  10 tablet    Refill:  0    Order Specific Question:   Supervising Provider    Answer:   Hoy Register [[id number]]    Follow-up: Return in about 4 weeks (around 08/12/2018) for Will call with lab and [place] results ASAP.   Loletta Specter PA

## 2018-07-15 NOTE — Progress Notes (Signed)
Current bilateral temporal headache since 4 am. Patient took aleve at 4 am with minimal relief and was able to fall asleep but woke up with nagging headache still present. The Excedrin migraine helps sometimes. Imitrex gives NO RELIEF

## 2018-07-16 ENCOUNTER — Telehealth (INDEPENDENT_AMBULATORY_CARE_PROVIDER_SITE_OTHER): Payer: Self-pay | Admitting: Physician Assistant

## 2018-07-16 ENCOUNTER — Ambulatory Visit (HOSPITAL_COMMUNITY)
Admission: RE | Admit: 2018-07-16 | Discharge: 2018-07-16 | Disposition: A | Discharge status: Home / Self Care | Payer: Self-pay | Source: Ambulatory Visit | Attending: Physician Assistant | Admitting: Physician Assistant

## 2018-07-16 DIAGNOSIS — M79662 Pain in left lower leg: Secondary | ICD-10-CM | POA: Insufficient documentation

## 2018-07-16 LAB — CBC WITH DIFFERENTIAL/PLATELET
BASOS ABS: 0 10*3/uL (ref 0.0–0.2)
Basos: 1 %
EOS (ABSOLUTE): 0.1 10*3/uL (ref 0.0–0.4)
Eos: 2 %
Hematocrit: 39.3 % (ref 34.0–46.6)
Hemoglobin: 12.4 g/dL (ref 11.1–15.9)
IMMATURE GRANULOCYTES: 0 %
Immature Grans (Abs): 0 10*3/uL (ref 0.0–0.1)
LYMPHS ABS: 2 10*3/uL (ref 0.7–3.1)
Lymphs: 52 %
MCH: 25.7 pg — ABNORMAL LOW (ref 26.6–33.0)
MCHC: 31.6 g/dL (ref 31.5–35.7)
MCV: 82 fL (ref 79–97)
MONOCYTES: 11 %
MONOS ABS: 0.4 10*3/uL (ref 0.1–0.9)
NEUTROS PCT: 34 %
Neutrophils Absolute: 1.3 10*3/uL — ABNORMAL LOW (ref 1.4–7.0)
Platelets: 288 10*3/uL (ref 150–450)
RBC: 4.82 x10E6/uL (ref 3.77–5.28)
RDW: 13.5 % (ref 12.3–15.4)
WBC: 3.8 10*3/uL (ref 3.4–10.8)

## 2018-07-16 LAB — COMPREHENSIVE METABOLIC PANEL
ALBUMIN: 4.6 g/dL (ref 3.5–5.5)
ALT: 16 IU/L (ref 0–32)
AST: 15 IU/L (ref 0–40)
Albumin/Globulin Ratio: 1.7 (ref 1.2–2.2)
Alkaline Phosphatase: 73 IU/L (ref 39–117)
BUN/Creatinine Ratio: 18 (ref 9–23)
BUN: 12 mg/dL (ref 6–20)
Bilirubin Total: <0.2 mg/dL (ref 0.0–1.2)
CALCIUM: 9.3 mg/dL (ref 8.7–10.2)
CO2: 23 mmol/L (ref 20–29)
CREATININE: 0.67 mg/dL (ref 0.57–1.00)
Chloride: 103 mmol/L (ref 96–106)
GFR calc Af Amer: 140 mL/min/{1.73_m2} (ref >59)
GFR, EST NON AFRICAN AMERICAN: 122 mL/min/{1.73_m2} (ref >59)
GLOBULIN, TOTAL: 2.7 g/dL (ref 1.5–4.5)
GLUCOSE: 90 mg/dL (ref 65–99)
Potassium: 3.7 mmol/L (ref 3.5–5.2)
SODIUM: 139 mmol/L (ref 134–144)
Total Protein: 7.3 g/dL (ref 6.0–8.5)

## 2018-07-16 LAB — D-DIMER, QUANTITATIVE (NOT AT ARMC): D-DIMER: 0.35 mg{FEU}/L (ref 0.00–0.49)

## 2018-07-16 NOTE — Telephone Encounter (Signed)
Tasia Catchingsraig from [facility] called to inform that [name] left lower extremity venus duplex is negative for DVT.  Jeanne Chaney

## 2018-07-16 NOTE — Progress Notes (Signed)
Left lower extremity venous duplex has been completed. Negative for DVT. Results [facility].  07/16/18 9:21 AM Olen CordialGreg Jerett Odonohue RVT

## 2018-07-26 ENCOUNTER — Telehealth: Payer: Self-pay | Admitting: *Deleted

## 2018-07-26 NOTE — Telephone Encounter (Signed)
Patient verified DOB Patient is aware of labs being normal and that the slightly low levels are stable for her and will continue to be monitored

## 2018-07-26 NOTE — Telephone Encounter (Signed)
Patient verified DOB ?Patient is aware of no DVT being noted. ? ?

## 2018-07-26 NOTE — Telephone Encounter (Signed)
-----   Message from Loletta Specteroger David Gomez, PA-C sent at 07/16/2018  1:35 PM EST ----- Labs are normal.

## 2018-07-26 NOTE — Telephone Encounter (Signed)
-----   Message from Loletta Specteroger David Gomez, PA-C sent at 07/16/2018  1:37 PM EST ----- No evidence of DVT on US.

## 2018-08-05 ENCOUNTER — Other Ambulatory Visit: Payer: Self-pay

## 2018-08-05 ENCOUNTER — Encounter (HOSPITAL_COMMUNITY): Payer: Self-pay

## 2018-08-05 ENCOUNTER — Emergency Department (HOSPITAL_COMMUNITY)
Admission: EM | Admit: 2018-08-05 | Discharge: 2018-08-05 | Disposition: A | Discharge status: Home / Self Care | Payer: Medicaid Other | Attending: Emergency Medicine | Admitting: Emergency Medicine

## 2018-08-05 DIAGNOSIS — Z79899 Other long term (current) drug therapy: Secondary | ICD-10-CM | POA: Insufficient documentation

## 2018-08-05 DIAGNOSIS — G43009 Migraine without aura, not intractable, without status migrainosus: Secondary | ICD-10-CM | POA: Insufficient documentation

## 2018-08-05 MED ORDER — SODIUM CHLORIDE 0.9 % IV BOLUS
1000.0000 mL | Freq: Once | INTRAVENOUS | Status: AC
  Administered 2018-08-05: 1000 mL via INTRAVENOUS

## 2018-08-05 MED ORDER — PROCHLORPERAZINE EDISYLATE 10 MG/2ML IJ SOLN
10.0000 mg | Freq: Once | INTRAMUSCULAR | Status: AC
  Administered 2018-08-05: 10 mg via INTRAVENOUS
  Filled 2018-08-05: qty 2

## 2018-08-05 MED ORDER — KETOROLAC TROMETHAMINE 30 MG/ML IJ SOLN
30.0000 mg | Freq: Once | INTRAMUSCULAR | Status: AC
  Administered 2018-08-05: 30 mg via INTRAVENOUS
  Filled 2018-08-05: qty 1

## 2018-08-05 MED ORDER — DIPHENHYDRAMINE HCL 50 MG/ML IJ SOLN
25.0000 mg | Freq: Once | INTRAMUSCULAR | Status: AC
  Administered 2018-08-05: 25 mg via INTRAVENOUS
  Filled 2018-08-05: qty 1

## 2018-08-05 NOTE — ED Notes (Signed)
PA at bedside.

## 2018-08-05 NOTE — ED Triage Notes (Addendum)
Patient c/o migraine since yesterday. Patient c/o sensitivity to light and blurred vision. Patient denies any N/V. Patient added that she has pain on the left foot that radiates into the left heel area since this AM. Patient denies any injury.

## 2018-08-05 NOTE — Discharge Instructions (Addendum)
Return here as needed.  Follow-up closely with your doctor for recheck.  Increase your fluid intake and rest as much as possible.

## 2018-08-05 NOTE — ED Provider Notes (Signed)
[facility]-EMERGENCY DEPT Provider Note   CSN: [id number] Arrival date & time: 08/05/18  1030     History   Chief Complaint Chief Complaint  Patient presents with  . Migraine  . Foot Pain    HPI Jeanne Chaney is a 27 y.o. female.  HPI Patient presents to the emergency department with migraine headache.  This headache started yesterday morning.  The patient states she has headaches on a frequent basis.  The patient states that nothing seems to make the condition better but light makes her headache worse.  Patient states that she has seen her primary doctor for the symptoms.  She was given 2 medications by him for her headaches.  The patient denies chest pain, shortness of breath, headache,blurred vision, neck pain, fever, cough, weakness, numbness, dizziness, anorexia, edema, abdominal pain, nausea, vomiting, diarrhea, rash, back pain, dysuria, hematemesis, bloody stool, near syncope, or syncope. Past Medical History:  Diagnosis Date  . Herpes genitalia   . Trichimoniasis   . Yeast infection     Patient Active Problem List   Diagnosis Date Noted  . Vaginal irritation 04/30/2017  . 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 Pap smear for cervical cancer screening 10/06/2016    History reviewed. No pertinent surgical history.   OB History    Gravida  2   Para  2   Term  2   Preterm      AB      Living  2     SAB      TAB      Ectopic      Multiple      Live Births  2            Home Medications    Prior to Admission medications   Medication Sig Start Date End Date Taking? Authorizing Provider  Etonogestrel (IMPLANON [place]) Inject 1 application into the skin once.   Yes [provider]  naproxen (NAPROSYN) 500 MG tablet Take 1 tablet (500 mg total) by mouth 2 (two) times daily with a meal. 07/15/18  Yes Loletta SpecterGomez, Roger David, PA-C  nortriptyline (PAMELOR) 25 MG capsule Take 1 capsule (25 mg total) by mouth at bedtime.  05/28/18  Yes Loletta SpecterGomez, Roger David, PA-C  cyclobenzaprine (FLEXERIL) 10 MG tablet Take 1 tablet (10 mg total) by mouth at bedtime. Patient not taking: Reported on 08/05/2018 07/15/18   Loletta SpecterGomez, Roger David, PA-C    Family History Family History  Problem Relation Age of Onset  . Asthma Mother     Social History Social History   Tobacco Use  . Smoking status: Never Smoker  . Smokeless tobacco: Never Used  Substance Use Topics  . Alcohol use: No  . Drug use: No     Allergies   Patient has no known allergies.   Review of Systems Review of Systems All other systems negative except as documented in the HPI. All pertinent positives and negatives as reviewed in the HPI.  Physical Exam Updated Vital Signs BP (!) 141/72 (BP Location: Left Arm)   Pulse 100   Temp 98.8 F (37.1 C) (Oral)   Resp 15   Ht 5\' 7"  (1.702 m)   Wt 107 kg   SpO2 99%   BMI 36.96 kg/m   Physical Exam Vitals signs and nursing note reviewed.  Constitutional:      General: She is not in acute distress.    Appearance: She is well-developed.  HENT:  Head: Normocephalic and atraumatic.  Eyes:     Pupils: Pupils are equal, round, and reactive to light.  Neck:     Musculoskeletal: Normal range of motion and neck supple.  Cardiovascular:     Rate and Rhythm: Normal rate and regular rhythm.     Heart sounds: Normal heart sounds. No murmur. No friction rub. No gallop.   Pulmonary:     Effort: Pulmonary effort is normal. No respiratory distress.     Breath sounds: Normal breath sounds. No wheezing.  Abdominal:     General: Bowel sounds are normal. There is no distension.     Palpations: Abdomen is soft.     Tenderness: There is no abdominal tenderness.  Skin:    General: Skin is warm and dry.     Capillary Refill: Capillary refill takes less than 2 seconds.     Findings: No erythema or rash.  Neurological:     Mental Status: She is alert and oriented to person, place, and time.     Cranial Nerves:  No cranial nerve deficit.     Sensory: No sensory deficit.     Motor: No weakness or abnormal muscle tone.     Coordination: Coordination normal.     Gait: Gait normal.     Deep Tendon Reflexes: Reflexes normal.  Psychiatric:        Behavior: Behavior normal.      ED Treatments / Results  Labs (all labs ordered are listed, but only abnormal results are displayed) Labs Reviewed - No data to display  EKG None  Radiology No results found.  Procedures Procedures (including critical care time)  Medications Ordered in ED Medications  sodium chloride 0.9 % bolus 1,000 mL (1,000 mLs Intravenous New Bag/Given 08/05/18 1453)  ketorolac (TORADOL) 30 MG/ML injection 30 mg (30 mg Intravenous Given 08/05/18 1454)  prochlorperazine (COMPAZINE) injection 10 mg (10 mg Intravenous Given 08/05/18 1456)  diphenhydrAMINE (BENADRYL) injection 25 mg (25 mg Intravenous Given 08/05/18 1454)     Initial Impression / Assessment and Plan / ED Course  I have reviewed the triage vital signs and the nursing notes.  Pertinent labs & imaging results that were available during my care of the patient were reviewed by me and considered in my medical decision making (see chart for details).     Patient will be treated for a migraine headache with Toradol, Compazine, Benadryl, and fluids.  The patient is feeling better at this time.  Patient is advised to return here as needed.  Told to follow-up with her primary doctor.  Final Clinical Impressions(s) / ED Diagnoses   Final diagnoses:  None    ED Discharge Orders    None       Charlestine Night, PA-C 08/05/18 1530    Pricilla Loveless, MD 08/05/18 1555

## 2018-11-06 ENCOUNTER — Encounter (HOSPITAL_COMMUNITY): Payer: Self-pay | Admitting: Emergency Medicine

## 2018-11-06 ENCOUNTER — Emergency Department (HOSPITAL_COMMUNITY): Payer: Self-pay

## 2018-11-06 ENCOUNTER — Other Ambulatory Visit: Payer: Self-pay

## 2018-11-06 ENCOUNTER — Emergency Department (HOSPITAL_COMMUNITY)
Admission: EM | Admit: 2018-11-06 | Discharge: 2018-11-06 | Disposition: A | Discharge status: Home / Self Care | Payer: Self-pay | Attending: Emergency Medicine | Admitting: Emergency Medicine

## 2018-11-06 DIAGNOSIS — J069 Acute upper respiratory infection, unspecified: Secondary | ICD-10-CM | POA: Insufficient documentation

## 2018-11-06 NOTE — Discharge Instructions (Addendum)
Continue Tylenol or Ibuprofen for pain Take over the counter cough and cold medicine Please avoid others while you have symptoms. Wash your hands and cover your mouth when you cough or sneeze Call your doctor if you continue to have symptoms for over a week Return if worsening

## 2018-11-06 NOTE — ED Triage Notes (Signed)
Patient reports cough, sore throat, headache since Monday. Reports some chills at home but denies fevers. No recent travel or known exposure to sick contacts.

## 2018-11-06 NOTE — ED Notes (Signed)
Patient transported to X-ray 

## 2018-11-06 NOTE — ED Notes (Signed)
Patient verbalized understanding of dc instructions, vss, ambulatory with nad.   

## 2018-11-06 NOTE — ED Provider Notes (Signed)
[facility] EMERGENCY DEPARTMENT Provider Note   CSN: [id number] Arrival date & time: 11/06/18  2248    History   Chief Complaint Chief Complaint  Patient presents with  . Cough  . Sore Throat    HPI Jayani A Reisig is a 28 y.o. female who presents with URI symptoms.  No significant past medical history.  The patient states that 3 days ago she started to get a runny nose, headache, sore throat, coughing.  She also endorses decreased appetite and nausea.  Cough is nonproductive but it does make her chest hurt when she coughs.  She also endorses shortness of breath.  She denies any fever, body aches, current chest pain, wheezing, abdominal pain, vomiting. She has been taking Alka Seltzer without significant relief. No known sick contacts or travel.     HPI  Past Medical History:  Diagnosis Date  . Herpes genitalia   . Trichimoniasis   . Yeast infection     Patient Active Problem List   Diagnosis Date Noted  . Vaginal irritation 04/30/2017  . 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 Pap smear for cervical cancer screening 10/06/2016    History reviewed. No pertinent surgical history.   OB History    Gravida  2   Para  2   Term  2   Preterm      AB      Living  2     SAB      TAB      Ectopic      Multiple      Live Births  2            Home Medications    Prior to Admission medications   Medication Sig Start Date End Date Taking? Authorizing Provider  cyclobenzaprine (FLEXERIL) 10 MG tablet Take 1 tablet (10 mg total) by mouth at bedtime. Patient not taking: Reported on 08/05/2018 07/15/18   Loletta Specter, PA-C  Etonogestrel [facility]) Inject 1 application into the skin once.    [provider]  naproxen (NAPROSYN) 500 MG tablet Take 1 tablet (500 mg total) by mouth 2 (two) times daily with a meal. 07/15/18   Loletta Specter, PA-C  nortriptyline (PAMELOR) 25 MG capsule Take 1 capsule (25 mg total) by mouth  at bedtime. 05/28/18   Loletta Specter, PA-C    Family History Family History  Problem Relation Age of Onset  . Asthma Mother     Social History Social History   Tobacco Use  . Smoking status: Never Smoker  . Smokeless tobacco: Never Used  Substance Use Topics  . Alcohol use: No  . Drug use: No     Allergies   Patient has no known allergies.   Review of Systems Review of Systems  Constitutional: Positive for appetite change. Negative for fever.  HENT: Positive for congestion, rhinorrhea, sore throat and voice change.   Respiratory: Positive for cough and shortness of breath.   Cardiovascular: Positive for chest pain (with coughing).  Gastrointestinal: Positive for nausea. Negative for abdominal pain and vomiting.     Physical Exam Updated Vital Signs BP (!) 141/85   Pulse (!) 115   Temp 99.2 F (37.3 C) (Oral)   Resp 12   SpO2 100%   Physical Exam Vitals signs and nursing note reviewed.  Constitutional:      General: She is not in acute distress.    Appearance: She is well-developed. She is not ill-appearing.  HENT:     Head: Normocephalic and atraumatic.     Right Ear: Tympanic membrane normal.     Left Ear: Tympanic membrane normal.     Mouth/Throat:     Mouth: Mucous membranes are moist.  Eyes:     General: No scleral icterus.       Right eye: No discharge.        Left eye: No discharge.     Conjunctiva/sclera: Conjunctivae normal.     Pupils: Pupils are equal, round, and reactive to light.  Neck:     Musculoskeletal: Normal range of motion.  Cardiovascular:     Rate and Rhythm: Tachycardia present.  Pulmonary:     Effort: Pulmonary effort is normal. No respiratory distress.     Breath sounds: Normal breath sounds.  Abdominal:     General: There is no distension.  Skin:    General: Skin is warm and dry.  Neurological:     Mental Status: She is alert and oriented to person, place, and time.  Psychiatric:        Behavior: Behavior normal.       ED Treatments / Results  Labs (all labs ordered are listed, but only abnormal results are displayed) Labs Reviewed - No data to display  EKG None  Radiology Dg Chest 2 View  Result Date: 11/06/2018 CLINICAL DATA:  Shortness of breath, congestion and history of asthma. EXAM: CHEST - 2 VIEW COMPARISON:  None. FINDINGS: The heart size and mediastinal contours are within normal limits. Lung volumes are normal. There is no evidence of pulmonary edema, consolidation, pneumothorax, nodule or pleural fluid. The visualized skeletal structures are unremarkable. IMPRESSION: No active cardiopulmonary disease. Electronically Signed   By: Irish Lack M.D.   On: 11/06/2018 08:28    Procedures Procedures (including critical care time)  Medications Ordered in ED Medications - No data to display   Initial Impression / Assessment and Plan / ED Course  I have reviewed the triage vital signs and the nursing notes.  Pertinent labs & imaging results that were available during my care of the patient were reviewed by me and considered in my medical decision making (see chart for details).  27 year old female with URI symptoms.  She also is endorsing some chest pain with coughing and shortness of breath.  She is mildly tachycardic and hypertensive but there is no fever and she is not febrile.  She does not have any fevers at home and she has not had any sick contacts or travel.  Low suspicion for COVID at this time.  Will get chest x-ray for shortness of breath symptoms although lungs are clear to auscultation and oxygenation is 100% on room air.  Chest x-ray is negative.  Discussed with patient.  Advised supportive care.  Advised her to call her doctor for new or worsening symptoms.  Final Clinical Impressions(s) / ED Diagnoses   Final diagnoses:  Upper respiratory tract infection, unspecified type    ED Discharge Orders    None       Bethel Born, PA-C 11/06/18 2951    Terrilee Files, MD 11/06/18 1002

## 2018-11-26 ENCOUNTER — Other Ambulatory Visit: Payer: Self-pay

## 2018-11-26 ENCOUNTER — Encounter: Payer: Self-pay | Admitting: Primary Care

## 2018-11-26 ENCOUNTER — Ambulatory Visit: Payer: Self-pay | Attending: Primary Care | Admitting: Primary Care

## 2018-11-26 VITALS — BP 131/74 | HR 109 | Temp 98.8°F | Ht 67.0 in | Wt 248.0 lb

## 2018-11-26 DIAGNOSIS — N898 Other specified noninflammatory disorders of vagina: Secondary | ICD-10-CM

## 2018-11-26 DIAGNOSIS — G43109 Migraine with aura, not intractable, without status migrainosus: Secondary | ICD-10-CM

## 2018-11-26 DIAGNOSIS — Z114 Encounter for screening for human immunodeficiency virus [HIV]: Secondary | ICD-10-CM

## 2018-11-26 MED ORDER — FLUCONAZOLE 150 MG PO TABS: 150.0000 mg | ORAL_TABLET | Freq: Once | ORAL | 0 refills | Status: AC

## 2018-11-26 MED ORDER — NORTRIPTYLINE HCL 25 MG PO CAPS: 25.0000 mg | ORAL_CAPSULE | Freq: Every day | ORAL | 3 refills | Status: DC

## 2018-11-26 NOTE — Progress Notes (Signed)
Vaginal itching started Sunday No odor or smell  Color is milky white

## 2018-11-26 NOTE — Progress Notes (Signed)
Acute Office Visit  Subjective:    Patient ID: Jeanne Chaney, female    DOB: Sep 14, 1990, 28 y.o.   MRN: [id number]  Chief Complaint  Patient presents with  . Vaginal Itching    Vaginal Discharge  The patient's primary symptoms include genital itching, a genital odor and vaginal discharge. This is a new problem. The current episode started in the past 7 days. The problem occurs constantly. The problem has been unchanged. The pain is mild. She is not pregnant. Associated symptoms include rash. The vaginal discharge was milky, thick, white and malodorous. There has been no bleeding. She has not been passing clots. She has not been passing tissue. The symptoms are aggravated by intercourse. She has tried nothing for the symptoms. She is sexually active. It is unknown whether or not her partner has an STD. Her menstrual history has been regular.   Patient is in today for   .hpi   Past Medical History:  Diagnosis Date  . Herpes genitalia   . Trichimoniasis   . Yeast infection     History reviewed. No pertinent surgical history.  Family History  Problem Relation Age of Onset  . Asthma Mother     Social History   Socioeconomic History  . Marital status: Single    Spouse name: Not on file  . Number of children: Not on file  . Years of education: Not on file  . Highest education level: Not on file  Occupational History  . Not on file  Social Needs  . Financial resource strain: Not on file  . Food insecurity:    Worry: Not on file    Inability: Not on file  . Transportation needs:    Medical: Not on file    Non-medical: Not on file  Tobacco Use  . Smoking status: Never Smoker  . Smokeless tobacco: Never Used  Substance and Sexual Activity  . Alcohol use: No  . Drug use: No  . Sexual activity: Yes    Birth control/protection: Implant  Lifestyle  . Physical activity:    Days per week: Not on file    Minutes per session: Not on file  . Stress: Not on file   Relationships  . Social connections:    Talks on phone: Not on file    Gets together: Not on file    Attends religious service: Not on file    Active member of club or organization: Not on file    Attends meetings of clubs or organizations: Not on file    Relationship status: Not on file  . Intimate partner violence:    Fear of current or ex partner: Not on file    Emotionally abused: Not on file    Physically abused: Not on file    Forced sexual activity: Not on file  Other Topics Concern  . Not on file  Social History Narrative  . Not on file    Outpatient Medications Prior to Visit  Medication Sig Dispense Refill  . Etonogestrel (IMPLANON [place]) Inject 1 application into the skin once.    . naproxen (NAPROSYN) 500 MG tablet Take 1 tablet (500 mg total) by mouth 2 (two) times daily with a meal. 20 tablet 0  . nortriptyline (PAMELOR) 25 MG capsule Take 1 capsule (25 mg total) by mouth at bedtime. 30 capsule 1  . cyclobenzaprine (FLEXERIL) 10 MG tablet Take 1 tablet (10 mg total) by mouth at bedtime. (Patient not taking: Reported on 08/05/2018) 10 tablet  0   No facility-administered medications prior to visit.     No Known Allergies  Review of Systems  Constitutional: Negative.   Respiratory: Negative.   Cardiovascular: Negative.   Gastrointestinal: Negative.   Genitourinary: Positive for vaginal discharge.  Skin: Positive for itching and rash.  Neurological: Negative.        Objective:    Physical Exam  Constitutional: She is oriented to person, place, and time. She appears well-developed and well-nourished.  HENT:  Head: Normocephalic.  Cardiovascular: Normal rate and regular rhythm.  Pulmonary/Chest: Effort normal and breath sounds normal.  Abdominal: Soft. Bowel sounds are normal. She exhibits distension.  Musculoskeletal: Normal range of motion.  Neurological: She is alert and oriented to person, place, and time.  Skin: Skin is warm.  Psychiatric: She has a  normal mood and affect.    BP 131/74 (BP Location: Right Arm, Patient Position: Sitting, Cuff Size: Large)   Pulse (!) 109   Temp 98.8 F (37.1 C) (Oral)   Ht 5\' 7"  (1.702 m)   Wt 248 lb (112.5 kg)   BMI 38.84 kg/m  Wt Readings from Last 3 Encounters:  11/26/18 248 lb (112.5 kg)  08/05/18 236 lb (107 kg)  07/15/18 237 lb (107.5 kg)    There are no preventive care reminders to display for this patient.  There are no preventive care reminders to display for this patient.   No results found for: TSH Lab Results  Component Value Date   WBC 3.8 07/15/2018   HGB 12.4 07/15/2018   HCT 39.3 07/15/2018   MCV 82 07/15/2018   PLT 288 07/15/2018   Lab Results  Component Value Date   NA 139 07/15/2018   K 3.7 07/15/2018   CO2 23 07/15/2018   GLUCOSE 90 07/15/2018   BUN 12 07/15/2018   CREATININE 0.67 07/15/2018   BILITOT <0.2 07/15/2018   ALKPHOS 73 07/15/2018   AST 15 07/15/2018   ALT 16 07/15/2018   PROT 7.3 07/15/2018   ALBUMIN 4.6 07/15/2018   CALCIUM 9.3 07/15/2018   ANIONGAP 7 04/04/2017   No results found for: CHOL No results found for: HDL No results found for: LDLCALC No results found for: TRIG No results found for: CHOLHDL Lab Results  Component Value Date   HGBA1C 5.9 04/30/2017       Assessment & Plan:   Problem List Items Addressed This Visit    Vaginal irritation - Primary (Chronic)   Relevant Medications   fluconazole (DIFLUCAN) 150 MG tablet   Other Relevant Orders   Urine cytology ancillary only   HIV Antibody (routine testing w rflx)    Other Visit Diagnoses    Screening for HIV (human immunodeficiency virus)       Relevant Orders   HIV Antibody (routine testing w rflx)   Migraine with aura and without status migrainosus, not intractable       Relevant Medications   nortriptyline (PAMELOR) 25 MG capsule     Jeanne Chaney was seen today for vaginal itching.  Diagnoses and all orders for this visit:  Vaginal irritation -     Urine cytology  ancillary only -     HIV Antibody (routine testing w rflx) -     fluconazole (DIFLUCAN) 150 MG tablet; Take 1 tablet (150 mg total) by mouth once for 1 dose. If symptoms are not resolved may repeatin 3days  Screening for HIV (human immunodeficiency virus) -     HIV Antibody (routine testing w rflx)  Migraine  with aura and without status migrainosus, not intractable -     nortriptyline (PAMELOR) 25 MG capsule; Take 1 capsule (25 mg total) by mouth at bedtime.    Meds ordered this encounter  Medications  . nortriptyline (PAMELOR) 25 MG capsule    Sig: Take 1 capsule (25 mg total) by mouth at bedtime.    Dispense:  30 capsule    Refill:  3  . fluconazole (DIFLUCAN) 150 MG tablet    Sig: Take 1 tablet (150 mg total) by mouth once for 1 dose. If symptoms are not resolved may repeatin 3days    Dispense:  2 tablet    Refill:  0     [name], NP

## 2018-11-26 NOTE — Patient Instructions (Signed)
Vaginitis    Vaginitis is irritation and swelling (inflammation) of the vagina. It happens when normal bacteria and yeast in the vagina grow too much. There are many types of this condition. Treatment will depend on the type you have.  Follow these instructions at home:  Lifestyle  · Keep your vagina area clean and dry.  ? Avoid using soap.  ? Rinse the area with water.  · Do not do the following until your doctor says it is okay:  ? Wash and clean out the vagina (douche).  ? Use tampons.  ? Have sex.  · Wipe from front to back after going to the bathroom.  · Let air reach your vagina.  ? Wear cotton underwear.  ? Do not wear:  ? Underwear while you sleep.  ? Tight pants.  ? Thong underwear.  ? Underwear or nylons without a cotton panel.  ? Take off any wet clothing, such as bathing suits, as soon as possible.  · Use gentle, non-scented products. Do not use things that can irritate the vagina, such as fabric softeners. Avoid the following products if they are scented:  ? Feminine sprays.  ? Detergents.  ? Tampons.  ? Feminine hygiene products.  ? Soaps or bubble baths.  · Practice safe sex and use condoms.  General instructions  · Take over-the-counter and prescription medicines only as told by your doctor.  · If you were prescribed an antibiotic medicine, take or use it as told by your doctor. Do not stop taking or using the antibiotic even if you start to feel better.  · Keep all follow-up visits as told by your doctor. This is important.  Contact a doctor if:  · You have pain in your belly.  · You have a fever.  · Your symptoms last for more than 2-3 days.  Get help right away if:  · You have a fever and your symptoms get worse all of a sudden.  Summary  · Vaginitis is irritation and swelling of the vagina. It can happen when the normal bacteria and yeast in the vagina grow too much. There are many types.  · Treatment will depend on the type you have.  · Do not douche, use tampons , or have sex until your health  care provider approves. When you can return to sex, practice safe sex and use condoms.  This information is not intended to replace advice given to you by your health care provider. Make sure you discuss any questions you have with your health care provider.  Document Released: 11/03/2008 Document Revised: 08/29/2016 Document Reviewed: 08/29/2016  Elsevier Interactive Patient Education © 2019 [employer]vier Inc[employer]

## 2018-11-27 LAB — URINE CYTOLOGY ANCILLARY ONLY: Candida vaginitis: NEGATIVE

## 2018-11-27 LAB — HIV ANTIBODY (ROUTINE TESTING W REFLEX): HIV Screen 4th Generation wRfx: NONREACTIVE

## 2018-11-28 ENCOUNTER — Other Ambulatory Visit: Payer: Self-pay | Admitting: Primary Care

## 2018-11-28 LAB — URINE CYTOLOGY ANCILLARY ONLY
Chlamydia: NEGATIVE
Neisseria Gonorrhea: NEGATIVE
Trichomonas: NEGATIVE

## 2018-11-28 MED ORDER — METRONIDAZOLE 500 MG PO TABS: 500.0000 mg | ORAL_TABLET | Freq: Three times a day (TID) | ORAL | 0 refills | Status: DC

## 2018-12-03 ENCOUNTER — Telehealth: Payer: Self-pay | Admitting: *Deleted

## 2018-12-03 NOTE — Telephone Encounter (Signed)
Patient verified DOB Patient is aware of results. 

## 2018-12-04 ENCOUNTER — Other Ambulatory Visit: Payer: Self-pay

## 2018-12-04 ENCOUNTER — Ambulatory Visit: Payer: Self-pay | Attending: Primary Care | Admitting: Primary Care

## 2018-12-04 DIAGNOSIS — G43109 Migraine with aura, not intractable, without status migrainosus: Secondary | ICD-10-CM

## 2018-12-04 NOTE — Progress Notes (Signed)
Patient verified DOB Patient has not taken medication today. Patient has eaten today. Patient denies pain currently HA earlier today

## 2018-12-10 ENCOUNTER — Encounter: Payer: Self-pay | Admitting: Primary Care

## 2018-12-10 NOTE — Progress Notes (Signed)
Acute Office Visit  Subjective:    Patient ID: Jeanne Chaney, female    DOB: 05-10-1991, 28 y.o.   MRN: [id number]  Chief Complaint  Patient presents with  . Migraine   Patient is in having a tele visit secondary to COVID-19. Patient is c/o headache similar to her migraines . There is no aura comes before the h/a. Light increases head ache.   Past Medical History:  Diagnosis Date  . Herpes genitalia   . Trichimoniasis   . Yeast infection     History reviewed. No pertinent surgical history.  Family History  Problem Relation Age of Onset  . Asthma Mother     Social History   Socioeconomic History  . Marital status: Single    Spouse name: Not on file  . Number of children: Not on file  . Years of education: Not on file  . Highest education level: Not on file  Occupational History  . Not on file  Social Needs  . Financial resource strain: Not on file  . Food insecurity:    Worry: Not on file    Inability: Not on file  . Transportation needs:    Medical: Not on file    Non-medical: Not on file  Tobacco Use  . Smoking status: Never Smoker  . Smokeless tobacco: Never Used  Substance and Sexual Activity  . Alcohol use: No  . Drug use: No  . Sexual activity: Yes    Birth control/protection: Implant  Lifestyle  . Physical activity:    Days per week: Not on file    Minutes per session: Not on file  . Stress: Not on file  Relationships  . Social connections:    Talks on phone: Not on file    Gets together: Not on file    Attends religious service: Not on file    Active member of club or organization: Not on file    Attends meetings of clubs or organizations: Not on file    Relationship status: Not on file  . Intimate partner violence:    Fear of current or ex partner: Not on file    Emotionally abused: Not on file    Physically abused: Not on file    Forced sexual activity: Not on file  Other Topics Concern  . Not on file  Social History Narrative  .  Not on file    Outpatient Medications Prior to Visit  Medication Sig Dispense Refill  . Etonogestrel (IMPLANON [place]) Inject 1 application into the skin once.    . nortriptyline (PAMELOR) 25 MG capsule Take 1 capsule (25 mg total) by mouth at bedtime. 30 capsule 3  . metroNIDAZOLE (FLAGYL) 500 MG tablet Take 1 tablet (500 mg total) by mouth 3 (three) times daily. 21 tablet 0   No facility-administered medications prior to visit.     No Known Allergies  Review of Systems  Constitutional: Negative.   HENT: Negative.   Eyes: Negative.   Respiratory: Positive for cough.   Cardiovascular: Negative.   Gastrointestinal: Negative.   Genitourinary: Negative.   Musculoskeletal: Negative.   Skin: Negative.   Neurological: Positive for weakness.  Endo/Heme/Allergies: Negative.   Psychiatric/Behavioral: Negative.        Objective:    Physical Exam  LMP 12/04/2018  Wt Readings from Last 3 Encounters:  11/26/18 248 lb (112.5 kg)  08/05/18 236 lb (107 kg)  07/15/18 237 lb (107.5 kg)    There are no preventive care reminders to  display for this patient.  There are no preventive care reminders to display for this patient.   No results found for: TSH Lab Results  Component Value Date   WBC 3.8 07/15/2018   HGB 12.4 07/15/2018   HCT 39.3 07/15/2018   MCV 82 07/15/2018   PLT 288 07/15/2018   Lab Results  Component Value Date   NA 139 07/15/2018   K 3.7 07/15/2018   CO2 23 07/15/2018   GLUCOSE 90 07/15/2018   BUN 12 07/15/2018   CREATININE 0.67 07/15/2018   BILITOT <0.2 07/15/2018   ALKPHOS 73 07/15/2018   AST 15 07/15/2018   ALT 16 07/15/2018   PROT 7.3 07/15/2018   ALBUMIN 4.6 07/15/2018   CALCIUM 9.3 07/15/2018   ANIONGAP 7 04/04/2017   No results found for: CHOL No results found for: HDL No results found for: LDLCALC No results found for: TRIG No results found for: CHOLHDL Lab Results  Component Value Date   HGBA1C 5.9 04/30/2017       Assessment & Plan:    Problem List Items Addressed This Visit    None    Visit Diagnoses    Migraine aura without headache    -  Primary    Jeanne Chaney was seen today for migraine.  Diagnoses and all orders for this visit:  Migraine aura without headache  This is a recurrent problem. The current episode started in the past 7 days. The problem occurs intermittently. The problem has been gradually worsening. The pain is located in the bilateral and frontal region. The pain does not radiate. The pain quality is similar to prior headaches. The quality of the pain is described as band-like, aching and sharp. The pain is at a severity of 4/10. The pain is mild. Associated symptoms include coughing and weakness. She has tried nothing for the symptoms. The treatment provided no relief.      No orders of the defined types were placed in this encounter.    Jeanne Sessions, NP

## 2019-01-04 ENCOUNTER — Other Ambulatory Visit: Payer: Self-pay

## 2019-01-04 ENCOUNTER — Emergency Department (HOSPITAL_COMMUNITY)
Admission: EM | Admit: 2019-01-04 | Discharge: 2019-01-04 | Disposition: A | Discharge status: Home / Self Care | Payer: Medicaid Other | Attending: Emergency Medicine | Admitting: Emergency Medicine

## 2019-01-04 ENCOUNTER — Encounter (HOSPITAL_COMMUNITY): Payer: Self-pay | Admitting: Emergency Medicine

## 2019-01-04 DIAGNOSIS — A6004 Herpesviral vulvovaginitis: Secondary | ICD-10-CM | POA: Insufficient documentation

## 2019-01-04 DIAGNOSIS — A6000 Herpesviral infection of urogenital system, unspecified: Secondary | ICD-10-CM

## 2019-01-04 DIAGNOSIS — Z79899 Other long term (current) drug therapy: Secondary | ICD-10-CM | POA: Insufficient documentation

## 2019-01-04 MED ORDER — ACYCLOVIR 800 MG PO TABS: 800.0000 mg | ORAL_TABLET | Freq: Two times a day (BID) | ORAL | 0 refills | Status: AC

## 2019-01-04 NOTE — ED Provider Notes (Signed)
[facility] EMERGENCY DEPARTMENT Provider Note   CSN: [id number] Arrival date & time: 01/04/19  1732    History   Chief Complaint No chief complaint on file.   HPI Jeanne Chaney is a 28 y.o. female.     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 Patient reports she noted a rash along her perineum last night, states this is tender to touch along with worse with ambulation. Patient states she had a previous history of HSV but has not been taking medication. I have reviewed patient's records which indicate she was originally on valtrex but due to cost, she stopped filling this medication. Patient is currently sexually active with one partner, does not always wear condoms. She denies any abdominal pain, vaginal discharge, vaginal bleeding, or urinary symptoms.      Past Medical History:  Diagnosis Date  . Herpes genitalia   . Trichimoniasis   . Yeast infection     Patient Active Problem List   Diagnosis Date Noted  . Vaginal irritation 04/30/2017  . 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 Pap smear for cervical cancer screening 10/06/2016    History reviewed. No pertinent surgical history.   OB History    Gravida  2   Para  2   Term  2   Preterm      AB      Living  2     SAB      TAB      Ectopic      Multiple      Live Births  2            Home Medications    Prior to Admission medications   Medication Sig Start Date End Date Taking? Authorizing Provider  acyclovir (ZOVIRAX) 800 MG tablet Take 1 tablet (800 mg total) by mouth 2 (two) times daily for 5 days. 01/04/19 01/09/19  Claude Manges, PA-C  Etonogestrel (IMPLANON [place]) Inject 1 application into the skin once.    [provider]  nortriptyline (PAMELOR) 25 MG capsule Take 1 capsule (25 mg total) by mouth at bedtime. 11/26/18   [name], NP    Family History Family History  Problem Relation Age  of Onset  . Asthma Mother     Social History Social History   Tobacco Use  . Smoking status: Never Smoker  . Smokeless tobacco: Never Used  Substance Use Topics  . Alcohol use: No  . Drug use: No     Allergies   Patient has no known allergies.   Review of Systems Review of Systems  Constitutional: Negative for fever.  Skin: Positive for rash.     Physical Exam Updated Vital Signs BP 128/76 (BP Location: Right Arm)   Pulse 96   Temp 99.8 F (37.7 C) (Oral)   Resp 16   LMP 12/28/2018   SpO2 98%   Physical Exam Vitals signs and nursing note reviewed. Exam conducted with a chaperone present.  Constitutional:      General: She is not in acute distress.    Appearance: She is well-developed.  HENT:     Head: Normocephalic and atraumatic.     Mouth/Throat:     Pharynx: No oropharyngeal exudate.  Eyes:     Pupils: Pupils are equal, round, and reactive to light.  Neck:     Musculoskeletal: Normal range of motion.  Cardiovascular:  Rate and Rhythm: Regular rhythm.     Heart sounds: Normal heart sounds.  Pulmonary:     Effort: Pulmonary effort is normal. No respiratory distress.     Breath sounds: Normal breath sounds.  Abdominal:     General: Bowel sounds are normal. There is no distension.     Palpations: Abdomen is soft.     Tenderness: There is no abdominal tenderness.  Genitourinary:    Exam position: Knee-chest position.     Pubic Area: Rash present.       Comments: Chaperoned by Micheline ChapmanKayla RN.  Pinpoint vesicle appreciated on left side of perineum with surrounding erythema.  Musculoskeletal:        General: No tenderness or deformity.     Right lower leg: No edema.     Left lower leg: No edema.  Skin:    General: Skin is warm and dry.  Neurological:     Mental Status: She is alert and oriented to person, place, and time.      ED Treatments / Results  Labs (all labs ordered are listed, but only abnormal results are displayed) Labs Reviewed -  No data to display  EKG None  Radiology No results found.  Procedures Procedures (including critical care time)  Medications Ordered in ED Medications - No data to display   Initial Impression / Assessment and Plan / ED Course  I have reviewed the triage vital signs and the nursing notes.  Pertinent labs & imaging results that were available during my care of the patient were reviewed by me and considered in my medical decision making (see chart for details).     Patient with a PMH of HSV2 presents to the ED with a chief complaint of rash around the rectal region, states this began yesterday. No medical therapy for relieve. Denies any abdominal pain, urinary symptoms, vaginal discharge or bleeding.  During evaluation chaperoned by Micheline ChapmanKayla RN, she reports rash along the perineum area which is vesicular in nature. Tenderness to palpation. Patient is in no distress.  She does report having a previous history of herpes simplex 2, was placed on Valtrex but could not afford medication therefore she stopped taking this medication.  Educated patient about herpes not having a cure, she will ultimately need to be on medication to prevent any other outbreak.  Patient reports she will follow-up with an gynecologist, we will treat her at this time with acyclovir as this might be cheaper for patient to fill.  Patient without any abdominal pain, fever, vaginal discharge or vaginal bleeding, no further examination warranted at this time.  Patient understands and agrees with management at this time.  Return precautions provided at length.  Portions of this note were generated with Scientist, clinical (histocompatibility and immunogenetics)Dragon dictation software. Dictation errors may occur despite best attempts at proofreading.    Final Clinical Impressions(s) / ED Diagnoses   Final diagnoses:  Genital herpes simplex type 2    ED Discharge Orders         Ordered    acyclovir (ZOVIRAX) 800 MG tablet  2 times daily     01/04/19 1831           Claude MangesSoto,  Dmoni Fortson, PA-C 01/04/19 1839    Linwood DibblesKnapp, Jon, MD 01/05/19 [phone]

## 2019-01-04 NOTE — Discharge Instructions (Addendum)
I have prescribed medication to help treat your outbreak. Please take 1 tablet twice a day for the next day. Please follow up with your OBGYN for further management and suppression medication.

## 2019-01-04 NOTE — ED Notes (Signed)
Discharge instructions and prescription discussed with Pt. Pt verbalized understanding. Pt stable and ambulatory.    

## 2019-01-04 NOTE — ED Triage Notes (Signed)
Pt reports lump and swelling on her vagina since yesterday, denies any discharge.

## 2019-01-08 ENCOUNTER — Telehealth: Payer: Self-pay | Admitting: Physician Assistant

## 2019-01-08 ENCOUNTER — Telehealth: Payer: Self-pay | Admitting: Pharmacist

## 2019-01-08 MED ORDER — ACYCLOVIR 400 MG PO TABS: 400.0000 mg | ORAL_TABLET | Freq: Two times a day (BID) | ORAL | 6 refills | Status: DC

## 2019-01-08 NOTE — Telephone Encounter (Signed)
1) Medication(s) Requested (by name): acyclovir (ZOVIRAX) 800 MG tablet  2) Pharmacy of Choice: [facility] [place], [place] - [address] 3) Special Requests:   Approved medications will be sent to the pharmacy, we will reach out if there is an issue.  Requests made after 3pm may not be addressed until the following business day!  If a patient is unsure of the name of the medication(s) please note and ask patient to call back when they are able to provide all info, do not send to responsible party until all information is available!

## 2019-01-08 NOTE — Telephone Encounter (Signed)
Pt recently seen in ED for HSV-2 infection. ED physician recommended chronic suppression therapy.   Pt's pharmacy requesting refills for acyclovir. Will route to [facility] for review.

## 2019-01-08 NOTE — Addendum Note (Signed)
Addended by: [name] on: 01/08/2019 12:09 PM   Modules accepted: Orders

## 2019-01-08 NOTE — Telephone Encounter (Signed)
Refill placed for suppression and sent

## 2019-01-09 ENCOUNTER — Ambulatory Visit: Payer: Self-pay | Attending: Primary Care | Admitting: Primary Care

## 2019-01-09 ENCOUNTER — Other Ambulatory Visit: Payer: Self-pay

## 2019-01-09 ENCOUNTER — Encounter: Payer: Self-pay | Admitting: Primary Care

## 2019-01-09 DIAGNOSIS — N898 Other specified noninflammatory disorders of vagina: Secondary | ICD-10-CM

## 2019-01-09 DIAGNOSIS — A6009 Herpesviral infection of other urogenital tract: Secondary | ICD-10-CM

## 2019-01-09 NOTE — Progress Notes (Signed)
Virtual Visit via Telephone Note  I connected with Jeanne Chaney on 01/09/19 at 10:48 AM EDT by telephone and verified that I am speaking with the correct person using two identifiers.   I discussed the limitations, risks, security and privacy concerns of performing an evaluation and management service by telephone and the availability of in person appointments. I also discussed with the patient that there may be a patient responsible charge related to this service. The patient expressed understanding and agreed to proceed.   History of Present Illness: Jeanne Chaney is an ED f/u for HSV2 she presented to the ED with a rash around her vagina that was tender to touch and caused ambulation difficulties. She left a message for refills for HSV2.    Observations/Objective: Review of Systems  Constitutional: Negative.   HENT: Negative.   Eyes: Negative.   Respiratory: Negative.   Cardiovascular: Negative.   Gastrointestinal: Negative.   Genitourinary: Negative.   Musculoskeletal: Negative.   Skin: Positive for itching and rash.  Endo/Heme/Allergies: Negative.   Psychiatric/Behavioral: Negative.     Assessment and Plan: Jeanne Chaney was seen today for herpes zoster.  Diagnoses and all orders for this visit:  Herpes genitalis in women Start treating HSV- 2 prophylaxis  Acyclovir 400 mg bid   Vaginal irritation Secondary to HSV-2 outbreak     Follow Up Instructions:    I discussed the assessment and treatment plan with the patient. The patient was provided an opportunity to ask questions and all were answered. The patient agreed with the plan and demonstrated an understanding of the instructions.   The patient was advised to call back or seek an in-person evaluation if the symptoms worsen or if the condition fails to improve as anticipated.  I provided 10 minutes of non-face-to-face time during this encounter.   Jeanne Sessions, NP

## 2019-01-14 NOTE — Telephone Encounter (Signed)
RX sent on 01/08/19

## 2019-01-25 ENCOUNTER — Emergency Department (HOSPITAL_COMMUNITY)
Admission: EM | Admit: 2019-01-25 | Discharge: 2019-01-25 | Disposition: A | Discharge status: Home / Self Care | Payer: Medicaid Other | Attending: Emergency Medicine | Admitting: Emergency Medicine

## 2019-01-25 ENCOUNTER — Encounter (HOSPITAL_COMMUNITY): Payer: Self-pay

## 2019-01-25 DIAGNOSIS — Z79899 Other long term (current) drug therapy: Secondary | ICD-10-CM | POA: Insufficient documentation

## 2019-01-25 DIAGNOSIS — G43809 Other migraine, not intractable, without status migrainosus: Secondary | ICD-10-CM | POA: Insufficient documentation

## 2019-01-25 DIAGNOSIS — J029 Acute pharyngitis, unspecified: Secondary | ICD-10-CM | POA: Insufficient documentation

## 2019-01-25 HISTORY — DX: Migraine, unspecified, not intractable, without status migrainosus: G43.909

## 2019-01-25 MED ORDER — KETOROLAC TROMETHAMINE 30 MG/ML IJ SOLN
30.0000 mg | Freq: Once | INTRAMUSCULAR | Status: AC
  Administered 2019-01-25: 30 mg via INTRAVENOUS
  Filled 2019-01-25: qty 1

## 2019-01-25 MED ORDER — METOCLOPRAMIDE HCL 5 MG/ML IJ SOLN
10.0000 mg | Freq: Once | INTRAMUSCULAR | Status: AC
  Administered 2019-01-25: 10 mg via INTRAVENOUS
  Filled 2019-01-25: qty 2

## 2019-01-25 MED ORDER — SODIUM CHLORIDE 0.9 % IV BOLUS
1000.0000 mL | Freq: Once | INTRAVENOUS | Status: AC
  Administered 2019-01-25: 1000 mL via INTRAVENOUS

## 2019-01-25 MED ORDER — DIPHENHYDRAMINE HCL 50 MG/ML IJ SOLN
12.5000 mg | Freq: Once | INTRAMUSCULAR | Status: AC
  Administered 2019-01-25: 12.5 mg via INTRAVENOUS
  Filled 2019-01-25: qty 1

## 2019-01-25 NOTE — ED Provider Notes (Signed)
[facility] [facility]ENC[facility] DEPARTMENT Provider Note   CSN: [id number] Arrival date & time: 01/25/19  1922    History   Chief Complaint Chief Complaint  Patient presents with  . Migraine    HPI Jeanne Chaney is a 28 y.o. female.     The history is provided by the patient. No language interpreter was used.  Migraine  This is a new problem. The problem occurs constantly. The problem has been gradually worsening. Associated symptoms include headaches. Pertinent negatives include no chest pain. Nothing aggravates the symptoms. Nothing relieves the symptoms. She has tried nothing for the symptoms. The treatment provided no relief.  Pt complains of a migraine.  Pt reports no relief with imitrex. Pt reports previous good response with imitrex.   Past Medical History:  Diagnosis Date  . Herpes genitalia   . Migraines   . Trichimoniasis   . Yeast infection     Patient Active Problem List   Diagnosis Date Noted  . Vaginal irritation 04/30/2017  . 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 Pap smear for cervical cancer screening 10/06/2016    History reviewed. No pertinent surgical history.   OB History    Gravida  2   Para  2   Term  2   Preterm      AB      Living  2     SAB      TAB      Ectopic      Multiple      Live Births  2            Home Medications    Prior to Admission medications   Medication Sig Start Date End Date Taking? Authorizing Provider  acyclovir (ZOVIRAX) 400 MG tablet Take 1 tablet (400 mg total) by mouth 2 (two) times daily. Patient not taking: Reported on 01/09/2019 01/08/19   Kerin Perna, NP  Etonogestrel (IMPLANON [place]) Inject 1 application into the skin once.    [provider]  nortriptyline (PAMELOR) 25 MG capsule Take 1 capsule (25 mg total) by mouth at bedtime. 11/26/18   Kerin Perna, NP    Family History Family History  Problem Relation Age of Onset  . Asthma Mother     Social  History Social History   Tobacco Use  . Smoking status: Never Smoker  . Smokeless tobacco: Never Used  Substance Use Topics  . Alcohol use: No  . Drug use: No     Allergies   Patient has no known allergies.   Review of Systems Review of Systems  Cardiovascular: Negative for chest pain.  Neurological: Positive for headaches.  All other systems reviewed and are negative.    Physical Exam Updated Vital Signs BP 137/74   Pulse 97   Temp 98.3 F (36.8 C) (Oral)   Resp 17   LMP 12/28/2018   SpO2 100%   Physical Exam Constitutional:      Appearance: She is well-developed.  HENT:     Head: Normocephalic and atraumatic.     Right Ear: Tympanic membrane normal.     Left Ear: Tympanic membrane normal.     Nose: Nose normal.     Mouth/Throat:     Mouth: Mucous membranes are moist.     Pharynx: Posterior oropharyngeal erythema present.  Eyes:     Conjunctiva/sclera: Conjunctivae normal.     Pupils: Pupils are equal, round, and reactive to light.  Neck:  Musculoskeletal: Normal range of motion and neck supple.  Cardiovascular:     Rate and Rhythm: Normal rate.  Pulmonary:     Effort: Pulmonary effort is normal.  Abdominal:     Palpations: Abdomen is soft.  Musculoskeletal: Normal range of motion.  Skin:    General: Skin is warm and dry.  Neurological:     Mental Status: She is alert and oriented to person, place, and time.  Psychiatric:        Mood and Affect: Mood normal.      ED Treatments / Results  Labs (all labs ordered are listed, but only abnormal results are displayed) Labs Reviewed - No data to display  EKG None  Radiology No results found.  Procedures Procedures (including critical care time)  Medications Ordered in ED Medications  sodium chloride 0.9 % bolus 1,000 mL (1,000 mLs Intravenous New Bag/Given 01/25/19 2010)  ketorolac (TORADOL) 30 MG/ML injection 30 mg (30 mg Intravenous Given 01/25/19 2010)  diphenhydrAMINE (BENADRYL)  injection 12.5 mg (12.5 mg Intravenous Given 01/25/19 2011)  metoCLOPramide (REGLAN) injection 10 mg (10 mg Intravenous Given 01/25/19 2012)     Initial Impression / Assessment and Plan / ED Course  I have reviewed the triage vital signs and the nursing notes.  Pertinent labs & imaging results that were available during my care of the patient were reviewed by me and considered in my medical decision making (see chart for details).        MDM   Pt given Iv fluids x 1 liter, torodol, reglan and benadryl.   Pt reports relief from headache.   Final Clinical Impressions(s) / ED Diagnoses   Final diagnoses:  Other migraine without status migrainosus, not intractable    ED Discharge Orders    None    An After Visit Summary was printed and given to the patient.    Osie CheeksSofia, Leslie K, PA-C 01/25/19 2120    Little, Jeanne Finlandachel Morgan, MD 01/25/19 2126

## 2019-01-25 NOTE — Discharge Instructions (Signed)
Return if any problems.

## 2019-01-25 NOTE — ED Triage Notes (Signed)
Pt states that she has a migraine headache that started about two hours ago without relief from home medications, photosensitive, hx of migraines

## 2019-03-07 ENCOUNTER — Other Ambulatory Visit (HOSPITAL_COMMUNITY)
Admission: RE | Admit: 2019-03-07 | Discharge: 2019-03-07 | Disposition: A | Discharge status: Home / Self Care | Payer: Medicaid Other | Source: Ambulatory Visit | Attending: Primary Care | Admitting: Primary Care

## 2019-03-07 ENCOUNTER — Encounter (INDEPENDENT_AMBULATORY_CARE_PROVIDER_SITE_OTHER): Payer: Self-pay | Admitting: Primary Care

## 2019-03-07 ENCOUNTER — Other Ambulatory Visit: Payer: Self-pay

## 2019-03-07 ENCOUNTER — Ambulatory Visit (INDEPENDENT_AMBULATORY_CARE_PROVIDER_SITE_OTHER): Payer: Self-pay | Admitting: Primary Care

## 2019-03-07 VITALS — BP 137/89 | HR 98 | Temp 97.6°F | Ht 67.0 in | Wt 248.8 lb

## 2019-03-07 DIAGNOSIS — F3289 Other specified depressive episodes: Secondary | ICD-10-CM

## 2019-03-07 DIAGNOSIS — N898 Other specified noninflammatory disorders of vagina: Secondary | ICD-10-CM | POA: Insufficient documentation

## 2019-03-07 DIAGNOSIS — G43809 Other migraine, not intractable, without status migrainosus: Secondary | ICD-10-CM

## 2019-03-07 DIAGNOSIS — Z114 Encounter for screening for human immunodeficiency virus [HIV]: Secondary | ICD-10-CM

## 2019-03-07 DIAGNOSIS — R030 Elevated blood-pressure reading, without diagnosis of hypertension: Secondary | ICD-10-CM

## 2019-03-07 LAB — POCT URINALYSIS DIPSTICK
Bilirubin, UA: NEGATIVE
Glucose, UA: NEGATIVE
Ketones, UA: NEGATIVE
Leukocytes, UA: NEGATIVE
Nitrite, UA: NEGATIVE
Protein, UA: POSITIVE — AB
Spec Grav, UA: >=1.03 — AB (ref 1.010–1.025)
Urobilinogen, UA: 0.2 E.U./dL
pH, UA: 5.5 (ref 5.0–8.0)

## 2019-03-07 MED ORDER — SUMATRIPTAN SUCCINATE 50 MG PO TABS: 50.0000 mg | ORAL_TABLET | ORAL | 0 refills | Status: DC | PRN

## 2019-03-07 MED ORDER — ESCITALOPRAM OXALATE 10 MG PO TABS: 10.0000 mg | ORAL_TABLET | Freq: Every day | ORAL | 1 refills | Status: DC

## 2019-03-07 MED ORDER — FLUCONAZOLE 150 MG PO TABS: 150.0000 mg | ORAL_TABLET | Freq: Once | ORAL | 1 refills | Status: AC

## 2019-03-07 NOTE — Progress Notes (Signed)
Acute Office Visit  Subjective:    Patient ID: Jeanne Chaney, female    DOB: 1991-06-11, [age] y.o.   MRN: [id number]  Chief Complaint  Patient presents with  . Vaginal Itching    2 days     HPI Patient is in today for acute visit she has complaints of vaginal itching no discharge or smell. She is on her menstrual cycle and the cytology may not be acute.   Past Medical History:  Diagnosis Date  . Herpes genitalia   . Migraines   . Trichimoniasis   . Yeast infection     History reviewed. No pertinent surgical history.  Family History  Problem Relation Age of Onset  . Asthma Mother     Social History   Socioeconomic History  . Marital status: Single    Spouse name: Not on file  . Number of children: Not on file  . Years of education: Not on file  . Highest education level: Not on file  Occupational History  . Not on file  Social Needs  . Financial resource strain: Not on file  . Food insecurity    Worry: Not on file    Inability: Not on file  . Transportation needs    Medical: Not on file    Non-medical: Not on file  Tobacco Use  . Smoking status: Never Smoker  . Smokeless tobacco: Never Used  Substance and Sexual Activity  . Alcohol use: No  . Drug use: No  . Sexual activity: Yes    Birth control/protection: Implant  Lifestyle  . Physical activity    Days per week: Not on file    Minutes per session: Not on file  . Stress: Not on file  Relationships  . Social Musicianconnections    Talks on phone: Not on file    Gets together: Not on file    Attends religious service: Not on file    Active member of club or organization: Not on file    Attends meetings of clubs or organizations: Not on file    Relationship status: Not on file  . Intimate partner violence    Fear of current or ex partner: Not on file    Emotionally abused: Not on file    Physically abused: Not on file    Forced sexual activity: Not on file  Other Topics Concern  . Not on file  Social  History Narrative  . Not on file    Outpatient Medications Prior to Visit  Medication Sig Dispense Refill  . acyclovir (ZOVIRAX) 400 MG tablet Take 1 tablet (400 mg total) by mouth 2 (two) times daily. 60 tablet 6  . Etonogestrel (IMPLANON [place]) Inject 1 application into the skin once.    . nortriptyline (PAMELOR) 25 MG capsule Take 1 capsule (25 mg total) by mouth at bedtime. (Patient not taking: Reported on 03/07/2019) 30 capsule 3   No facility-administered medications prior to visit.     No Known Allergies  Review of Systems  Constitutional: Positive for malaise/fatigue.  Skin: Negative.   Neurological: Positive for headaches.  Psychiatric/Behavioral: Positive for depression. The patient has insomnia.        Objective:    Physical Exam  Constitutional: She is oriented to person, place, and time. She appears well-developed and well-nourished.  HENT:  Head: Normocephalic.  Neck: Neck supple.  Cardiovascular: Normal rate and regular rhythm.  Pulmonary/Chest: Effort normal and breath sounds normal.  Abdominal: Soft. Bowel sounds are normal. She  exhibits distension.  Genitourinary:    Vaginal discharge present.   Neurological: She is oriented to person, place, and time.  Psychiatric: She has a normal mood and affect.    BP 137/89 (BP Location: Left Arm, Patient Position: Sitting, Cuff Size: Large)   Pulse 98   Temp 97.6 F (36.4 C) (Tympanic)   Ht 5\' 7"  (1.702 m)   Wt 248 lb 12.8 oz (112.9 kg)   LMP 03/04/2019   SpO2 99%   BMI 38.97 kg/m  Wt Readings from Last 3 Encounters:  03/07/19 248 lb 12.8 oz (112.9 kg)  11/26/18 248 lb (112.5 kg)  08/05/18 236 lb (107 kg)    There are no preventive care reminders to display for this patient.  There are no preventive care reminders to display for this patient.   No results found for: TSH Lab Results  Component Value Date   WBC 3.8 07/15/2018   HGB 12.4 07/15/2018   HCT 39.3 07/15/2018   MCV 82 07/15/2018   PLT 288  07/15/2018   Lab Results  Component Value Date   NA 139 07/15/2018   K 3.7 07/15/2018   CO2 23 07/15/2018   GLUCOSE 90 07/15/2018   BUN 12 07/15/2018   CREATININE 0.67 07/15/2018   BILITOT <0.2 07/15/2018   ALKPHOS 73 07/15/2018   AST 15 07/15/2018   ALT 16 07/15/2018   PROT 7.3 07/15/2018   ALBUMIN 4.6 07/15/2018   CALCIUM 9.3 07/15/2018   ANIONGAP 7 04/04/2017   No results found for: CHOL No results found for: HDL No results found for: LDLCALC No results found for: TRIG No results found for: CHOLHDL Lab Results  Component Value Date   HGBA1C 5.9 04/30/2017       Assessment & Plan:   Problem List Items Addressed This Visit    None    Visit Diagnoses    Vaginal itching    -  Primary   Relevant Orders   Urinalysis Dipstick (Completed)   Cervicovaginal ancillary only   Elevated blood pressure reading in office without diagnosis of hypertension       Other depression       Relevant Medications   escitalopram (LEXAPRO) 10 MG tablet   Screening for HIV (human immunodeficiency virus)       Relevant Orders   HIV antibody (with reflex)   Other migraine without status migrainosus, not intractable       Relevant Medications   escitalopram (LEXAPRO) 10 MG tablet   SUMAtriptan (IMITREX) 50 MG tablet    Veola was seen today for vaginal itching.  Diagnoses and all orders for this visit:  Vaginal itching -     Urinalysis Dipstick -     Cervicovaginal ancillary only  Elevated blood pressure reading in office without diagnosis of hypertension Counseled on blood pressure goal of less than 130/80, low-sodium, DASH diet, medication compliance, 150 minutes of moderate intensity exercise per week. Discussed medication compliance, adverse effects.  Other depression Depression is when you feel down, blue or sad for at least 2 weeks in a row. You may experience increaset increase in sleeping, eating or  loss of interest in doing things that once gave you pleasure. Feeling  worthless, guilty, nervous and low self esteem, avoiding interaction with other people or increase agitation. .  Depression can affect your thoughts and feelings, relationships, daily activities, and physical health. It is caused by changes in the way your brain functions. If you receive a diagnosis of depression, your health  care provider will tell you which type of depression you have and what treatment options are available to you. Causes of depression may be caused by a part of your brain that controls emotion. Social factors can contribute such as traumatic experiences or major life changes    Screening for HIV (human immunodeficiency virus) -     HIV antibody (with reflex)  Other migraine without status migrainosus, not intractable Signs can be individualized with throbbing pain on any area of the head.  They may come with an aura-dizziness, nausea, sensitive to light sound or smell. Triggers can be caused by drinking alcohol smoking or certain medications, eating and drinking certain products. This condition may be triggered or caused by: Caffeine, aged cheese, alcohol and chocolate.  Other orders -     escitalopram (LEXAPRO) 10 MG tablet; Take 1 tablet (10 mg total) by mouth daily.     Meds ordered this encounter  Medications  . escitalopram (LEXAPRO) 10 MG tablet    Sig: Take 1 tablet (10 mg total) by mouth daily.    Dispense:  30 tablet    Refill:  1  . SUMAtriptan (IMITREX) 50 MG tablet    Sig: Take 1 tablet (50 mg total) by mouth every 2 (two) hours as needed for migraine. May repeat in 2 hours if headache persists or recurs.    Dispense:  10 tablet    Refill:  0  . fluconazole (DIFLUCAN) 150 MG tablet    Sig: Take 1 tablet (150 mg total) by mouth once for 1 dose.    Dispense:  2 tablet    Refill:  1     Kerin Perna, NP

## 2019-03-07 NOTE — Patient Instructions (Signed)
Living With Depression Everyone experiences occasional disappointment, sadness, and loss in their lives. When you are feeling down, blue, or sad for at least 2 weeks in a row, it may mean that you have depression. Depression can affect your thoughts and feelings, relationships, daily activities, and physical health. It is caused by changes in the way your brain functions. If you receive a diagnosis of depression, your health care provider will tell you which type of depression you have and what treatment options are available to you. If you are living with depression, there are ways to help you recover from it and also ways to prevent it from coming back. How to cope with lifestyle changes Coping with stress     Stress is your body's reaction to life changes and events, both good and bad. Stressful situations may include:  Getting married.  The death of a spouse.  Losing a job.  Retiring.  Having a baby. Stress can last just a few hours or it can be ongoing. Stress can play a major role in depression, so it is important to learn both how to cope with stress and how to think about it differently. Talk with your health care provider or a counselor if you would like to learn more about stress reduction. He or she may suggest some stress reduction techniques, such as:  Music therapy. This can include creating music or listening to music. Choose music that you enjoy and that inspires you.  Mindfulness-based meditation. This kind of meditation can be done while sitting or walking. It involves being aware of your normal breaths, rather than trying to control your breathing.  Centering prayer. This is a kind of meditation that involves focusing on a spiritual word or phrase. Choose a word, phrase, or sacred image that is meaningful to you and that brings you peace.  Deep breathing. To do this, expand your stomach and inhale slowly through your nose. Hold your breath for 3-5 seconds, then exhale  slowly, allowing your stomach muscles to relax.  Muscle relaxation. This involves intentionally tensing muscles then relaxing them. Choose a stress reduction technique that fits your lifestyle and personality. Stress reduction techniques take time and practice to develop. Set aside 5-15 minutes a day to do them. Therapists can offer training in these techniques. The training may be covered by some insurance plans. Other things you can do to manage stress include:  Keeping a stress diary. This can help you learn what triggers your stress and ways to control your response.  Understanding what your limits are and saying no to requests or events that lead to a schedule that is too full.  Thinking about how you respond to certain situations. You may not be able to control everything, but you can control how you react.  Adding humor to your life by watching funny films or TV shows.  Making time for activities that help you relax and not feeling guilty about spending your time this way.  Medicines Your health care provider may suggest certain medicines if he or she feels that they will help improve your condition. Avoid using alcohol and other substances that may prevent your medicines from working properly (may interact). It is also important to:  Talk with your pharmacist or health care provider about all the medicines that you take, their possible side effects, and what medicines are safe to take together.  Make it your goal to take part in all treatment decisions (shared decision-making). This includes giving input on   the side effects of medicines. It is best if shared decision-making with your health care provider is part of your total treatment plan. If your health care provider prescribes a medicine, you may not notice the full benefits of it for 4-8 weeks. Most people who are treated for depression need to be on medicine for at least 6-12 months after they feel better. If you are taking  medicines as part of your treatment, do not stop taking medicines without first talking to your health care provider. You may need to have the medicine slowly decreased (tapered) over time to decrease the risk of harmful side effects. Relationships Your health care provider may suggest family therapy along with individual therapy and drug therapy. While there may not be family problems that are causing you to feel depressed, it is still important to make sure your family learns as much as they can about your mental health. Having your family's support can help make your treatment successful. How to recognize changes in your condition Everyone has a different response to treatment for depression. Recovery from major depression happens when you have not had signs of major depression for two months. This may mean that you will start to:  Have more interest in doing activities.  Feel less hopeless than you did 2 months ago.  Have more energy.  Overeat less often, or have better or improving appetite.  Have better concentration. Your health care provider will work with you to decide the next steps in your recovery. It is also important to recognize when your condition is getting worse. Watch for these signs:  Having fatigue or low energy.  Eating too much or too little.  Sleeping too much or too little.  Feeling restless, agitated, or hopeless.  Having trouble concentrating or making decisions.  Having unexplained physical complaints.  Feeling irritable, angry, or aggressive. Get help as soon as you or your family members notice these symptoms coming back. How to get support and help from others How to talk with friends and family members about your condition  Talking to friends and family members about your condition can provide you with one way to get support and guidance. Reach out to trusted friends or family members, explain your symptoms to them, and let them know that you are  working with a health care provider to treat your depression. Financial resources Not all insurance plans cover mental health care, so it is important to check with your insurance carrier. If paying for co-pays or counseling services is a problem, search for a local or county mental health care center. They may be able to offer public mental health care services at low or no cost when you are not able to see a private health care provider. If you are taking medicine for depression, you may be able to get the generic form, which may be less expensive. Some makers of prescription medicines also offer help to patients who cannot afford the medicines they need. Follow these instructions at home:   Get the right amount and quality of sleep.  Cut down on using caffeine, tobacco, alcohol, and other potentially harmful substances.  Try to exercise, such as walking or lifting small weights.  Take over-the-counter and prescription medicines only as told by your health care provider.  Eat a healthy diet that includes plenty of vegetables, fruits, whole grains, low-fat dairy products, and lean protein. Do not eat a lot of foods that are high in solid fats, added sugars, or salt.    Keep all follow-up visits as told by your health care provider. This is important. Contact a health care provider if:  You stop taking your antidepressant medicines, and you have any of these symptoms: ? Nausea. ? Headache. ? Feeling lightheaded. ? Chills and body aches. ? Not being able to sleep (insomnia).  You or your friends and family think your depression is getting worse. Get help right away if:  You have thoughts of hurting yourself or others. If you ever feel like you may hurt yourself or others, or have thoughts about taking your own life, get help right away. You can go to your nearest emergency department or call:  Your local emergency services (911 in the [place]  A suicide crisis helpline, such as the  National Suicide Prevention Lifeline at 1-800-273-8255. This is open 24-hours a day. Summary  If you are living with depression, there are ways to help you recover from it and also ways to prevent it from coming back.  Work with your health care team to create a management plan that includes counseling, stress management techniques, and healthy lifestyle habits. This information is not intended to replace advice given to you by your health care provider. Make sure you discuss any questions you have with your health care provider. Document Released: 07/10/2016 Document Revised: 11/29/2018 Document Reviewed: 07/10/2016 Elsevier Patient Education  2020 [employer]  

## 2019-03-08 LAB — HIV ANTIBODY (ROUTINE TESTING W REFLEX): HIV Screen 4th Generation wRfx: NONREACTIVE

## 2019-03-10 ENCOUNTER — Telehealth (INDEPENDENT_AMBULATORY_CARE_PROVIDER_SITE_OTHER): Payer: Self-pay

## 2019-03-10 NOTE — Telephone Encounter (Signed)
Patient is aware that HIV is negative. Will call when ancillary results are available. Nat Christen, CMA

## 2019-03-10 NOTE — Telephone Encounter (Signed)
-----   Message from Kerin Perna, NP sent at 03/09/2019  1:30 PM EDT ----- HIV test is negative

## 2019-03-13 LAB — CERVICOVAGINAL ANCILLARY ONLY
Bacterial vaginitis: POSITIVE — AB
Candida vaginitis: POSITIVE — AB
Chlamydia: NEGATIVE
Neisseria Gonorrhea: NEGATIVE
Trichomonas: NEGATIVE

## 2019-03-14 ENCOUNTER — Telehealth (INDEPENDENT_AMBULATORY_CARE_PROVIDER_SITE_OTHER): Payer: Self-pay

## 2019-03-14 ENCOUNTER — Other Ambulatory Visit (INDEPENDENT_AMBULATORY_CARE_PROVIDER_SITE_OTHER): Payer: Self-pay | Admitting: Primary Care

## 2019-03-14 MED ORDER — METRONIDAZOLE 500 MG PO TABS: 500.0000 mg | ORAL_TABLET | Freq: Two times a day (BID) | ORAL | 0 refills | Status: DC

## 2019-03-14 MED ORDER — FLUCONAZOLE 150 MG PO TABS: 150.0000 mg | ORAL_TABLET | Freq: Once | ORAL | 0 refills | Status: AC

## 2019-03-14 NOTE — Telephone Encounter (Signed)
-----   Message from Kerin Perna, NP sent at 03/14/2019 12:40 PM EDT ----- Patient has yeast and BV infection will send in medication for both. A prescription for metronidazole. You take this medication twice a day for 7 days. Be sure that you do not drink alcohol when you take this medication because the combination can give you severe nausea and vomiting.  It can sometimes give people a metallic taste in their mouth while they are taking it. When you take antibiotics, it can wipe out your gut flora.  This can cause problems like diarrhea.  It would be helpful to restore your gut flora and potentially decrease the side effects if you were to take either an over-the-counter probiotic such as [employer], [employer], therapeutic or [employer]  I would recommend taking this on a daily basis for at least 3-4 weeks. Candia yeast can grow in moist area and change in flora. Cotton white panties are the to wear and non tight fitting clothes

## 2019-03-14 NOTE — Telephone Encounter (Signed)
Patient is aware that she has BV and a yeast infection. Metronidazole sent to pharmacy. Take twice a day for seven days. Upon completion take the diflucan. Avoid alcohol while taking antibiotics. Patient did not have any other questions and expressed understanding of results. Nat Christen, CMA

## 2019-04-14 ENCOUNTER — Emergency Department (HOSPITAL_COMMUNITY): Payer: No Typology Code available for payment source

## 2019-04-14 ENCOUNTER — Encounter (HOSPITAL_COMMUNITY): Payer: Self-pay | Admitting: Emergency Medicine

## 2019-04-14 ENCOUNTER — Emergency Department (HOSPITAL_COMMUNITY)
Admission: EM | Admit: 2019-04-14 | Discharge: 2019-04-14 | Disposition: A | Discharge status: Home / Self Care | Payer: No Typology Code available for payment source | Attending: Emergency Medicine | Admitting: Emergency Medicine

## 2019-04-14 DIAGNOSIS — Y929 Unspecified place or not applicable: Secondary | ICD-10-CM | POA: Insufficient documentation

## 2019-04-14 DIAGNOSIS — R51 Headache: Secondary | ICD-10-CM | POA: Insufficient documentation

## 2019-04-14 DIAGNOSIS — S02609A Fracture of mandible, unspecified, initial encounter for closed fracture: Secondary | ICD-10-CM

## 2019-04-14 DIAGNOSIS — Y999 Unspecified external cause status: Secondary | ICD-10-CM | POA: Insufficient documentation

## 2019-04-14 DIAGNOSIS — S1081XA Abrasion of other specified part of neck, initial encounter: Secondary | ICD-10-CM | POA: Diagnosis not present

## 2019-04-14 DIAGNOSIS — S5011XA Contusion of right forearm, initial encounter: Secondary | ICD-10-CM

## 2019-04-14 DIAGNOSIS — T07XXXA Unspecified multiple injuries, initial encounter: Secondary | ICD-10-CM | POA: Diagnosis present

## 2019-04-14 DIAGNOSIS — Z79899 Other long term (current) drug therapy: Secondary | ICD-10-CM | POA: Insufficient documentation

## 2019-04-14 DIAGNOSIS — Y939 Activity, unspecified: Secondary | ICD-10-CM | POA: Insufficient documentation

## 2019-04-14 MED ORDER — HYDROCODONE-ACETAMINOPHEN 5-325 MG PO TABS: 1.0000 | ORAL_TABLET | ORAL | 0 refills | Status: DC | PRN

## 2019-04-14 MED ORDER — METHOCARBAMOL 500 MG PO TABS
500.0000 mg | ORAL_TABLET | Freq: Once | ORAL | Status: AC
  Administered 2019-04-14: 500 mg via ORAL
  Filled 2019-04-14: qty 1

## 2019-04-14 MED ORDER — IBUPROFEN 400 MG PO TABS
600.0000 mg | ORAL_TABLET | Freq: Once | ORAL | Status: AC
  Administered 2019-04-14: 600 mg via ORAL
  Filled 2019-04-14: qty 1

## 2019-04-14 NOTE — ED Notes (Signed)
Pt transported to Xray. 

## 2019-04-14 NOTE — Discharge Instructions (Signed)
Eat soft food to avoid chewing Follow up with ENT Take pain medicine as needed for severe pain. Take Ibuprofen for mild-moderate pain Return if worsening
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
## 2019-08-05 ENCOUNTER — Telehealth (INDEPENDENT_AMBULATORY_CARE_PROVIDER_SITE_OTHER): Payer: Medicaid Other | Admitting: Primary Care

## 2019-08-05 DIAGNOSIS — G43809 Other migraine, not intractable, without status migrainosus: Secondary | ICD-10-CM

## 2019-08-05 MED ORDER — METOPROLOL TARTRATE 25 MG PO TABS: 25.0000 mg | ORAL_TABLET | Freq: Two times a day (BID) | ORAL | 3 refills | Status: DC

## 2019-08-05 NOTE — Progress Notes (Signed)
Virtual Visit via Telephone Note  I connected with Jeanne Chaney on 08/05/19 at  9:50 AM EST by telephone and verified that I am speaking with the correct person using two identifiers.   I discussed the limitations, risks, security and privacy concerns of performing an evaluation and management service by telephone and the availability of in person appointments. I also discussed with the patient that there may be a patient responsible charge related to this service. The patient expressed understanding and agreed to proceed.   History of Present Illness: Jeanne Chaney is having a web visit for migraines causing insomnia, interfering with activities of daily living. Aura nausea and photosensitivity.  Past Medical History:  Diagnosis Date  . Herpes genitalia   . Migraines   . Trichimoniasis   . Yeast infection      Current Outpatient Medications on File Prior to Visit  Medication Sig Dispense Refill  . acyclovir (ZOVIRAX) 400 MG tablet Take 1 tablet (400 mg total) by mouth 2 (two) times daily. 60 tablet 6  . escitalopram (LEXAPRO) 10 MG tablet Take 1 tablet (10 mg total) by mouth daily. 30 tablet 1  . Etonogestrel (IMPLANON [place]) Inject 1 application into the skin once.    Marland Kitchen HYDROcodone-acetaminophen (NORCO/VICODIN) 5-325 MG tablet Take 1 tablet by mouth every 4 (four) hours as needed. 15 tablet 0  . metroNIDAZOLE (FLAGYL) 500 MG tablet Take 1 tablet (500 mg total) by mouth 2 (two) times daily. 14 tablet 0  . SUMAtriptan (IMITREX) 50 MG tablet Take 1 tablet (50 mg total) by mouth every 2 (two) hours as needed for migraine. May repeat in 2 hours if headache persists or recurs. 10 tablet 0   No current facility-administered medications on file prior to visit.   Observations/Objective: Review of Systems  Eyes: Positive for photophobia.  Gastrointestinal: Positive for nausea.  Neurological: Positive for dizziness and headaches.  All other systems reviewed and are negative.  Assessment and  Plan: Diagnoses and all orders for this visit:  Other migraine without status migrainosus, not intractable Discussed treatment and triggers can be caused by drinking alcohol smoking or certain medications, eating and drinking certain products Caffeine, aged cheese, and chocolate. Has not picked up Imitrix sent in metoprolol 25 mg twice daily dual purpose   This condition may be triggered or caused by:  -     Ambulatory referral to Neurology    Follow Up Instructions:    I discussed the assessment and treatment plan with the patient. The patient was provided an opportunity to ask questions and all were answered. The patient agreed with the plan and demonstrated an understanding of the instructions.   The patient was advised to call back or seek an in-person evaluation if the symptoms worsen or if the condition fails to improve as anticipated.  I provided 15 minutes of non-face-to-face time during this encounter.   Kerin Perna, NP

## 2019-08-09 ENCOUNTER — Other Ambulatory Visit: Payer: Self-pay

## 2019-08-09 ENCOUNTER — Encounter (HOSPITAL_COMMUNITY): Payer: Self-pay

## 2019-08-09 ENCOUNTER — Ambulatory Visit (HOSPITAL_COMMUNITY)
Admission: EM | Admit: 2019-08-09 | Discharge: 2019-08-09 | Disposition: A | Discharge status: Home / Self Care | Payer: Medicaid Other | Attending: Family Medicine | Admitting: Family Medicine

## 2019-08-09 DIAGNOSIS — Z20822 Contact with and (suspected) exposure to covid-19: Secondary | ICD-10-CM

## 2019-08-09 DIAGNOSIS — Z20828 Contact with and (suspected) exposure to other viral communicable diseases: Secondary | ICD-10-CM

## 2019-08-09 NOTE — ED Triage Notes (Signed)
Pt children was recently exposed to covid 64 by the father. Pt denies any symptoms but would like to be tested

## 2019-08-09 NOTE — Discharge Instructions (Addendum)
If your Covid-19 test is positive, you will receive a phone call from [facility] regarding your results. Negative test results are not called. Both positive and negative results area always visible on MyChart. If you do not have a MyChart account, sign up instructions are in your discharge papers.  

## 2019-08-10 LAB — NOVEL CORONAVIRUS, NAA (HOSP ORDER, SEND-OUT TO REF LAB; TAT 18-24 HRS): SARS-CoV-2, NAA: NOT DETECTED

## 2019-08-11 NOTE — ED Provider Notes (Signed)
[facility]   [id number] 08/09/19 Arrival Time: [place] PLAN:  1. Exposure to COVID-19 virus      COVID-19 testing sent. To self-quarantine until results are available. If requested, work note provided.  Follow-up Information    Kerin Perna, NP.   Specialty: Internal Medicine Why: As needed. Contact information: [place] [address] [phone]           Reviewed expectations re: course of current medical issues. Questions answered. Outlined signs and symptoms indicating need for more acute intervention. Patient verbalized understanding. After Visit Summary given.   SUBJECTIVE: History from: patient. Jeanne Chaney is a 28 y.o. female who requests COVID-19 testing. Known COVID-19 contact: reports recent exposure. Recent travel: none. Denies: runny nose, congestion, fever, cough, sore throat, difficulty breathing and headache. Normal PO intake without n/v/d.  ROS: As per HPI.   OBJECTIVE:  Vitals:   08/09/19 1325  BP: 121/68  Pulse: (!) 58  Resp: 16  Temp: 98.9 F (37.2 C)  TempSrc: Oral  SpO2: 100%    General appearance: alert; no distress Eyes: PERRLA; EOMI; conjunctiva normal HENT: [place]; AT; nasal mucosa normal; oral mucosa normal Neck: supple  Lungs: speaks full sentences without difficulty; unlabored Heart: regular rate and rhythm Abdomen: soft, non-tender Extremities: no edema Skin: warm and dry Neurologic: normal gait Psychological: alert and cooperative; normal mood and affect  Labs:  Labs Reviewed  NOVEL CORONAVIRUS, NAA (HOSP ORDER, SEND-OUT TO REF LAB; TAT 18-24 HRS)     No Known Allergies  Past Medical History:  Diagnosis Date  . Herpes genitalia   . Migraines   . Trichimoniasis   . Yeast infection    Social History   Socioeconomic History  . Marital status: Single    Spouse name: Not on file  . Number of children: Not on file  . Years of education: Not on file  .  Highest education level: Not on file  Occupational History  . Not on file  Tobacco Use  . Smoking status: Never Smoker  . Smokeless tobacco: Never Used  Substance and Sexual Activity  . Alcohol use: No  . Drug use: No  . Sexual activity: Yes    Birth control/protection: Implant  Other Topics Concern  . Not on file  Social History Narrative  . Not on file   Social Determinants of Health   Financial Resource Strain:   . Difficulty of Paying Living Expenses: Not on file  Food Insecurity:   . Worried About Charity fundraiser in the Last Year: Not on file  . Ran Out of Food in the Last Year: Not on file  Transportation Needs:   . Lack of Transportation (Medical): Not on file  . Lack of Transportation (Non-Medical): Not on file  Physical Activity:   . Days of Exercise per Week: Not on file  . Minutes of Exercise per Session: Not on file  Stress:   . Feeling of Stress : Not on file  Social Connections:   . Frequency of Communication with Friends and Family: Not on file  . Frequency of Social Gatherings with Friends and Family: Not on file  . Attends Religious Services: Not on file  . Active Member of Clubs or Organizations: Not on file  . Attends Archivist Meetings: Not on file  . Marital Status: Not on file  Intimate Partner Violence:   . Fear of Current or Ex-Partner: Not on file  . Emotionally Abused:  Not on file  . Physically Abused: Not on file  . Sexually Abused: Not on file   Family History  Problem Relation Age of Onset  . Asthma Mother    History reviewed. No pertinent surgical history.   Mardella Layman, MD 08/11/19 [phone]

## 2019-08-12 ENCOUNTER — Ambulatory Visit (HOSPITAL_COMMUNITY)
Admission: EM | Admit: 2019-08-12 | Discharge: 2019-08-12 | Disposition: A | Discharge status: Home / Self Care | Payer: Medicaid Other

## 2019-08-12 NOTE — ED Notes (Signed)
Mother was tested on 08/09/2019 and test results came back already, she does not need to be retested per provider.

## 2019-08-27 ENCOUNTER — Other Ambulatory Visit (INDEPENDENT_AMBULATORY_CARE_PROVIDER_SITE_OTHER): Payer: Self-pay | Admitting: Primary Care

## 2019-08-27 ENCOUNTER — Telehealth (INDEPENDENT_AMBULATORY_CARE_PROVIDER_SITE_OTHER): Payer: Self-pay

## 2019-08-27 MED ORDER — SUMATRIPTAN SUCCINATE 50 MG PO TABS: 50.0000 mg | ORAL_TABLET | ORAL | 3 refills | Status: DC | PRN

## 2019-08-27 NOTE — Telephone Encounter (Signed)
FWD to PCP

## 2019-08-27 NOTE — Telephone Encounter (Signed)
Discontinue metoprolol resent Imitrex  50mg  # 10

## 2019-08-27 NOTE — Telephone Encounter (Signed)
Patient called stating that metoprolol tartrate (LOPRESSOR) 25 MG tablet  Causes her to light headed and dizzy. Patient also states that it does not help with her migraines.  Please advice [phone]  Thank you Jeanne Chaney

## 2019-09-24 ENCOUNTER — Ambulatory Visit: Payer: Self-pay | Admitting: Neurology

## 2019-11-24 ENCOUNTER — Emergency Department (HOSPITAL_COMMUNITY)
Admission: EM | Admit: 2019-11-24 | Discharge: 2019-11-24 | Disposition: A | Discharge status: Home / Self Care | Payer: Medicaid Other | Attending: Emergency Medicine | Admitting: Emergency Medicine

## 2019-11-24 ENCOUNTER — Encounter (HOSPITAL_COMMUNITY): Payer: Self-pay | Admitting: Emergency Medicine

## 2019-11-24 DIAGNOSIS — R197 Diarrhea, unspecified: Secondary | ICD-10-CM | POA: Insufficient documentation

## 2019-11-24 DIAGNOSIS — Z20822 Contact with and (suspected) exposure to covid-19: Secondary | ICD-10-CM | POA: Insufficient documentation

## 2019-11-24 DIAGNOSIS — R110 Nausea: Secondary | ICD-10-CM | POA: Insufficient documentation

## 2019-11-24 DIAGNOSIS — R6883 Chills (without fever): Secondary | ICD-10-CM | POA: Insufficient documentation

## 2019-11-24 LAB — URINALYSIS, ROUTINE W REFLEX MICROSCOPIC
Bilirubin Urine: NEGATIVE
Glucose, UA: NEGATIVE mg/dL
Hgb urine dipstick: NEGATIVE
Ketones, ur: NEGATIVE mg/dL
Leukocytes,Ua: NEGATIVE
Nitrite: NEGATIVE
Protein, ur: NEGATIVE mg/dL
Specific Gravity, Urine: 1.028 (ref 1.005–1.030)
pH: 6 (ref 5.0–8.0)

## 2019-11-24 LAB — POC URINE PREG, ED: Preg Test, Ur: NEGATIVE

## 2019-11-24 LAB — SARS CORONAVIRUS 2 (TAT 6-24 HRS): SARS Coronavirus 2: NEGATIVE

## 2019-11-24 MED ORDER — ONDANSETRON 4 MG PO TBDP
4.0000 mg | ORAL_TABLET | Freq: Once | ORAL | Status: AC
  Administered 2019-11-24: 4 mg via ORAL
  Filled 2019-11-24: qty 1

## 2019-11-24 MED ORDER — ONDANSETRON 4 MG PO TBDP: 4.0000 mg | ORAL_TABLET | Freq: Three times a day (TID) | ORAL | 0 refills | Status: DC | PRN

## 2019-11-24 NOTE — Discharge Instructions (Signed)
Your testing shows no signs of urinary infection, you are not pregnant, the Covid test will take a couple of days to come back.  Take Zofran as needed, drink lots of fluids, seek medical exam for severe or worsening symptoms

## 2019-11-24 NOTE — ED Notes (Signed)
Patient Alert and oriented to baseline. Stable and ambulatory to baseline. Patient verbalized understanding of the discharge instructions.  Patient belongings were taken by the patient.   

## 2019-11-24 NOTE — ED Provider Notes (Signed)
[facility] EMERGENCY DEPARTMENT Provider Note   CSN: [id number] Arrival date & time: 11/24/19  0809     History Chief Complaint  Patient presents with  . Abdominal Pain  . Nausea  . Emesis    Jeanne Chaney is a 29 y.o. female.  HPI   This patient is a 29 year old female, she has no chronic medical conditions for which she takes any daily medicines, she presents to the hospital today with 1 week of intermittent chills, no objective fever, she has had some associated nausea vomiting and diarrhea that started yesterday after eating some cookies.  She denies any vaginal symptoms, denies any urinary symptoms except for mild urinary frequency.  She has no dysuria, no hematuria, no back pain and no abdominal pain.  She has no coughing or shortness of breath, no sore throat or runny nose, no headache.  No stiff neck.  No rashes.  She has not been exposed anybody has been sick, she has not been traveling, her significant other had Covid last 2 month.  Past Medical History:  Diagnosis Date  . Herpes genitalia   . Migraines   . Trichimoniasis   . Yeast infection     Patient Active Problem List   Diagnosis Date Noted  . Vaginal irritation 04/30/2017  . 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 Pap smear for cervical cancer screening 10/06/2016    History reviewed. No pertinent surgical history.   OB History    Gravida  2   Para  2   Term  2   Preterm      AB      Living  2     SAB      TAB      Ectopic      Multiple      Live Births  2           Family History  Problem Relation Age of Onset  . Asthma Mother     Social History   Tobacco Use  . Smoking status: Never Smoker  . Smokeless tobacco: Never Used  Substance Use Topics  . Alcohol use: No  . Drug use: No    Home Medications Prior to Admission medications   Medication Sig Start Date End Date Taking? Authorizing Provider  Etonogestrel (IMPLANON [place]) Inject 1 application into  the skin once.   Yes [provider]  SUMAtriptan (IMITREX) 50 MG tablet Take 1 tablet (50 mg total) by mouth every 2 (two) hours as needed for migraine. May repeat in 2 hours if headache persists or recurs. 08/27/19  Yes Kerin Perna, NP  ondansetron (ZOFRAN ODT) 4 MG disintegrating tablet Take 1 tablet (4 mg total) by mouth every 8 (eight) hours as needed for nausea. 11/24/19   Noemi Chapel, MD  escitalopram (LEXAPRO) 10 MG tablet Take 1 tablet (10 mg total) by mouth daily. Patient not taking: Reported on 11/24/2019 03/07/19 11/24/19  Kerin Perna, NP    Allergies    Patient has no known allergies.  Review of Systems   Review of Systems  All other systems reviewed and are negative.   Physical Exam Updated Vital Signs BP 126/63 (BP Location: Left Arm)   Pulse 73   Temp 98.7 F (37.1 C)   Resp 17   Ht 1.702 m (5\' 7" )   Wt 99.8 kg   LMP 11/21/2019   SpO2 99%   BMI 34.46 kg/m   Physical Exam Vitals and nursing note  reviewed.  Constitutional:      General: She is not in acute distress.    Appearance: She is well-developed.  HENT:     Head: Normocephalic and atraumatic.     Mouth/Throat:     Pharynx: No oropharyngeal exudate.  Eyes:     General: No scleral icterus.       Right eye: No discharge.        Left eye: No discharge.     Conjunctiva/sclera: Conjunctivae normal.     Pupils: Pupils are equal, round, and reactive to light.  Neck:     Thyroid: No thyromegaly.     Vascular: No JVD.  Cardiovascular:     Rate and Rhythm: Normal rate and regular rhythm.     Heart sounds: Normal heart sounds. No murmur. No friction rub. No gallop.   Pulmonary:     Effort: Pulmonary effort is normal. No respiratory distress.     Breath sounds: Normal breath sounds. No wheezing or rales.  Abdominal:     General: Bowel sounds are normal. There is no distension.     Palpations: Abdomen is soft. There is no mass.     Tenderness: There is no abdominal tenderness.    Musculoskeletal:        General: No tenderness. Normal range of motion.     Cervical back: Normal range of motion and neck supple.  Lymphadenopathy:     Cervical: No cervical adenopathy.  Skin:    General: Skin is warm and dry.     Findings: No erythema or rash.  Neurological:     Mental Status: She is alert.     Coordination: Coordination normal.  Psychiatric:        Behavior: Behavior normal.     ED Results / Procedures / Treatments   Labs (all labs ordered are listed, but only abnormal results are displayed) Labs Reviewed  URINALYSIS, ROUTINE W REFLEX MICROSCOPIC - Abnormal; Notable for the following components:      Result Value   APPearance HAZY (*)    All other components within normal limits  SARS CORONAVIRUS 2 (TAT 6-24 HRS)  POC URINE PREG, ED    EKG None  Radiology No results found.  Procedures Procedures (including critical care time)  Medications Ordered in ED Medications  ondansetron (ZOFRAN-ODT) disintegrating tablet 4 mg (4 mg Oral Given 11/24/19 3710)    ED Course  I have reviewed the triage vital signs and the nursing notes.  Pertinent labs & imaging results that were available during my care of the patient were reviewed by me and considered in my medical decision making (see chart for details).    MDM Rules/Calculators/A&P                       This patient presents to the ED for concern of  Infection / n/v/d, this involves an extensive number of treatment options, and is a complaint that carries with it a high risk of complications and morbidity.  The differential diagnosis includes Covid, UTI, viral infection, food poisoning.   Lab Tests:   I Ordered, reviewed, and interpreted labs, which included UA, preg, covid test negative urinalysis, negative pregnancy test, Covid test pending, vital signs totally normal, patient has no abdominal pain or abnormal pulmonary findings to suggest the need for further evaluation  Medicines  ordered:   I ordered medication Zofran  For Nausea   Additional history obtained:   Additional history obtained from BF, medical record  Previous records obtained and reviewed multiple prior visitis for minor complaints.  Consultations Obtained:   I consulted family and the patient and discussed lab and imaging findings  Reevaluation:  After the interventions stated above, I reevaluated the patient and found improved, Zofran improve the patient's symptoms, negative urinalysis    Final Clinical Impression(s) / ED Diagnoses Final diagnoses:  Nauseated  Chills    Rx / DC Orders ED Discharge Orders         Ordered    ondansetron (ZOFRAN ODT) 4 MG disintegrating tablet  Every 8 hours PRN     11/24/19 0953           Eber Hong, MD 11/24/19 [phone]

## 2019-11-24 NOTE — ED Triage Notes (Signed)
Pt. Stated, Ive had styomach pain with N/V for 2 days. Feeling tired.

## 2019-12-31 ENCOUNTER — Telehealth (INDEPENDENT_AMBULATORY_CARE_PROVIDER_SITE_OTHER): Payer: Medicaid Other | Admitting: Primary Care

## 2020-01-18 ENCOUNTER — Emergency Department (HOSPITAL_COMMUNITY)
Admission: EM | Admit: 2020-01-18 | Discharge: 2020-01-19 | Disposition: A | Discharge status: Home / Self Care | Payer: Medicaid Other | Attending: Emergency Medicine | Admitting: Emergency Medicine

## 2020-01-18 DIAGNOSIS — Z5321 Procedure and treatment not carried out due to patient leaving prior to being seen by health care provider: Secondary | ICD-10-CM | POA: Insufficient documentation

## 2020-01-18 DIAGNOSIS — R69 Illness, unspecified: Secondary | ICD-10-CM | POA: Insufficient documentation

## 2020-01-18 NOTE — ED Notes (Signed)
Called pts name 3x to be triaged with no response.  

## 2020-01-27 ENCOUNTER — Telehealth (INDEPENDENT_AMBULATORY_CARE_PROVIDER_SITE_OTHER): Payer: Self-pay

## 2020-01-27 NOTE — Telephone Encounter (Signed)
Patient called to request a medication refill for   acyclovir (ZOVIRAX) 400 MG tablet    Patient uses [facility] on [address]   Please advice [phone]

## 2020-01-27 NOTE — Telephone Encounter (Signed)
Patient scheduled appointment with PCP to discuss need as this medication has not been prescribed in sometime and she is not having a current outbreak.

## 2020-01-30 ENCOUNTER — Other Ambulatory Visit: Payer: Self-pay

## 2020-01-30 ENCOUNTER — Encounter (INDEPENDENT_AMBULATORY_CARE_PROVIDER_SITE_OTHER): Payer: Self-pay | Admitting: Primary Care

## 2020-01-30 ENCOUNTER — Ambulatory Visit (INDEPENDENT_AMBULATORY_CARE_PROVIDER_SITE_OTHER): Payer: Self-pay | Admitting: Primary Care

## 2020-01-30 ENCOUNTER — Other Ambulatory Visit (HOSPITAL_COMMUNITY)
Admission: RE | Admit: 2020-01-30 | Discharge: 2020-01-30 | Disposition: A | Discharge status: Home / Self Care | Payer: Medicaid Other | Source: Ambulatory Visit | Attending: Primary Care | Admitting: Primary Care

## 2020-01-30 VITALS — BP 122/75 | HR 85 | Temp 97.3°F | Ht 67.0 in | Wt 206.2 lb

## 2020-01-30 DIAGNOSIS — Z01419 Encounter for gynecological examination (general) (routine) without abnormal findings: Secondary | ICD-10-CM | POA: Insufficient documentation

## 2020-01-30 DIAGNOSIS — Z113 Encounter for screening for infections with a predominantly sexual mode of transmission: Secondary | ICD-10-CM | POA: Diagnosis present

## 2020-01-30 DIAGNOSIS — T148XXA Other injury of unspecified body region, initial encounter: Secondary | ICD-10-CM

## 2020-01-30 DIAGNOSIS — Z1159 Encounter for screening for other viral diseases: Secondary | ICD-10-CM

## 2020-01-30 NOTE — Progress Notes (Signed)
Patient states the she has bruises that randomly appear on her arms and thighs without her bumping into anything

## 2020-01-30 NOTE — Patient Instructions (Signed)

## 2020-01-30 NOTE — Progress Notes (Signed)
Subjective:     Jeanne Chaney is a 29 y.o. female and is here for a comprehensive physical exam. The patient reports bruising unknown etiology .  Social History   Socioeconomic History  . Marital status: Single    Spouse name: Not on file  . Number of children: Not on file  . Years of education: Not on file  . Highest education level: Not on file  Occupational History  . Not on file  Tobacco Use  . Smoking status: Never Smoker  . Smokeless tobacco: Never Used  Vaping Use  . Vaping Use: Never used  Substance and Sexual Activity  . Alcohol use: No  . Drug use: No  . Sexual activity: Yes    Birth control/protection: Implant  Other Topics Concern  . Not on file  Social History Narrative  . Not on file   Social Determinants of Health   Financial Resource Strain:   . Difficulty of Paying Living Expenses:   Food Insecurity:   . Worried About Programme researcher, broadcasting/film/video in the Last Year:   . Barista in the Last Year:   Transportation Needs:   . Freight forwarder (Medical):   Marland Kitchen Lack of Transportation (Non-Medical):   Physical Activity:   . Days of Exercise per Week:   . Minutes of Exercise per Session:   Stress:   . Feeling of Stress :   Social Connections:   . Frequency of Communication with Friends and Family:   . Frequency of Social Gatherings with Friends and Family:   . Attends Religious Services:   . Active Member of Clubs or Organizations:   . Attends Banker Meetings:   Marland Kitchen Marital Status:   Intimate Partner Violence:   . Fear of Current or Ex-Partner:   . Emotionally Abused:   Marland Kitchen Physically Abused:   . Sexually Abused:    Health Maintenance  Topic Date Due  . Hepatitis C Screening  Never done  . COVID-19 Vaccine (1) Never done  . PAP-Cervical Cytology Screening  10/07/2019  . PAP SMEAR-Modifier  10/07/2019  . INFLUENZA VACCINE  03/21/2020  . TETANUS/TDAP  05/29/2027  . HIV Screening  Completed    Review of Systems Pertinent items  noted in HPI and remainder of comprehensive ROS otherwise negative.   Objective:  BP 122/75 (BP Location: Right Arm, Patient Position: Sitting, Cuff Size: Large)   Pulse 85   Temp (!) 97.3 F (36.3 C) (Other (Comment))   Ht 5\' 7"  (1.702 m)   Wt 206 lb 3.2 oz (93.5 kg)   LMP 01/18/2020 (Exact Date)   SpO2 100%   BMI 32.30 kg/m     Assessment:  CONSTITUTIONAL: Well-developed, well-nourished obese  female in no acute distress.  HENT:  Normocephalic, atraumatic, External right and left ear normal. Oropharynx is clear and moist EYES: Conjunctivae and EOM are normal. Pupils are equal, round, and reactive to light. No scleral icterus.  NECK: Normal range of motion, supple, no masses.  Normal thyroid.  SKIN: Skin is warm and dry. No rash noted. Not diaphoretic. No erythema. No pallor. NEUROLGIC: Alert and oriented to person, place, and time. Normal reflexes, muscle tone coordination. No cranial nerve deficit noted. PSYCHIATRIC: Normal mood and affect. Normal behavior. Normal judgment and thought content. CARDIOVASCULAR: Normal heart rate noted, regular rhythm RESPIRATORY: Clear to auscultation bilaterally. Effort and breath sounds normal, no problems with respiration noted. BREASTS: Taught SBE. ABDOMEN: Soft, normal bowel sounds, no distention noted.  No tenderness, rebound or guarding.  PELVIC: Normal appearing external genitalia; normal appearing vaginal mucosa and cervix.  No abnormal discharge noted.  Pap smear obtained.  Normal uterine size, no other palpable masses, no uterine or adnexal tenderness. MUSCULOSKELETAL: Normal range of motion. No tenderness.  No cyanosis, clubbing, or edema.  2+ distal pulses.  Plan:  Jeanne Chaney was seen today for gynecologic exam and medication refill.  Diagnoses and all orders for this visit:  Encounter for gynecological examination without abnormal finding -     GC/Chlamydia Probe Amp (genital or urine) ([facility] / [facility]) -     Cytology -  PAP  Screening for STD (sexually transmitted disease) -     Cervicovaginal ancillary only  Need for hepatitis C screening test Labs completed for Hepatitis C    See After Visit Summary for Counseling Recommendations

## 2020-01-31 LAB — CBC WITH DIFFERENTIAL/PLATELET
Basophils Absolute: 0 10*3/uL (ref 0.0–0.2)
Basos: 0 %
EOS (ABSOLUTE): 0.1 10*3/uL (ref 0.0–0.4)
Eos: 3 %
Hematocrit: 35.7 % (ref 34.0–46.6)
Hemoglobin: 11 g/dL — ABNORMAL LOW (ref 11.1–15.9)
Immature Grans (Abs): 0 10*3/uL (ref 0.0–0.1)
Immature Granulocytes: 0 %
Lymphocytes Absolute: 1.8 10*3/uL (ref 0.7–3.1)
Lymphs: 41 %
MCH: 25.8 pg — ABNORMAL LOW (ref 26.6–33.0)
MCHC: 30.8 g/dL — ABNORMAL LOW (ref 31.5–35.7)
MCV: 84 fL (ref 79–97)
Monocytes Absolute: 0.5 10*3/uL (ref 0.1–0.9)
Monocytes: 12 %
Neutrophils Absolute: 1.9 10*3/uL (ref 1.4–7.0)
Neutrophils: 44 %
Platelets: 243 10*3/uL (ref 150–450)
RBC: 4.27 x10E6/uL (ref 3.77–5.28)
RDW: 14.9 % (ref 11.7–15.4)
WBC: 4.3 10*3/uL (ref 3.4–10.8)

## 2020-01-31 LAB — HEPATITIS C ANTIBODY: Hep C Virus Ab: <0.1 s/co ratio (ref 0.0–0.9)

## 2020-02-02 LAB — CERVICOVAGINAL ANCILLARY ONLY
Bacterial Vaginitis (gardnerella): POSITIVE — AB
Candida Glabrata: NEGATIVE
Candida Vaginitis: NEGATIVE
Chlamydia: NEGATIVE
Comment: NEGATIVE
Comment: NEGATIVE
Comment: NEGATIVE
Comment: NEGATIVE
Comment: NEGATIVE
Comment: NORMAL
Neisseria Gonorrhea: NEGATIVE
Trichomonas: NEGATIVE

## 2020-02-03 ENCOUNTER — Other Ambulatory Visit (INDEPENDENT_AMBULATORY_CARE_PROVIDER_SITE_OTHER): Payer: Self-pay | Admitting: Primary Care

## 2020-02-03 DIAGNOSIS — N760 Acute vaginitis: Secondary | ICD-10-CM

## 2020-02-03 LAB — CYTOLOGY - PAP
Diagnosis: NEGATIVE
Diagnosis: REACTIVE

## 2020-02-03 MED ORDER — METRONIDAZOLE 500 MG PO TABS: 500.0000 mg | ORAL_TABLET | Freq: Two times a day (BID) | ORAL | 0 refills | Status: DC

## 2020-02-04 ENCOUNTER — Telehealth (INDEPENDENT_AMBULATORY_CARE_PROVIDER_SITE_OTHER): Payer: Self-pay

## 2020-02-04 NOTE — Telephone Encounter (Signed)
Patient called in regards to her test. States she was aware of the   But had additional questions. Patient states PCP advice she would be testing her to see why is bruising randomly.  Please advice [phone]

## 2020-02-05 NOTE — Telephone Encounter (Signed)
Sent to PCP ?

## 2020-02-05 NOTE — Telephone Encounter (Signed)
Spoke with patient reviewed labs platelets normal unknown etiology of bruising. Asked patient to monitor the frequency. Than she explain why she was worried her aunt needs a organ liver transplant and she is considering donation if she is a matched. Assured her they will do all the necessary blood work and procedures to assure she is a Microbiologist

## 2020-02-19 ENCOUNTER — Other Ambulatory Visit: Payer: Self-pay

## 2020-02-19 ENCOUNTER — Telehealth (INDEPENDENT_AMBULATORY_CARE_PROVIDER_SITE_OTHER): Payer: Medicaid Other | Admitting: Primary Care

## 2020-02-19 ENCOUNTER — Encounter (INDEPENDENT_AMBULATORY_CARE_PROVIDER_SITE_OTHER): Payer: Self-pay | Admitting: Primary Care

## 2020-02-19 DIAGNOSIS — N898 Other specified noninflammatory disorders of vagina: Secondary | ICD-10-CM

## 2020-02-19 NOTE — Progress Notes (Signed)
Pt was being treated for BV. Medication caused yeast infection while taking. She did not complete medication. She is able to self swab but does not want to be put back on metronidazole.

## 2020-02-28 NOTE — Progress Notes (Signed)
Virtual Visit via Telephone Note  I connected with Jeanne Chaney on 02/28/20 at  4:10 PM EDT by telephone and verified that I am speaking with the correct person using two identifiers.   I discussed the limitations, risks, security and privacy concerns of performing an evaluation and management service by telephone and the availability of in person appointments. I also discussed with the patient that there may be a patient responsible charge related to this service. The patient expressed understanding and agreed to proceed.   History of Present Illness: [name] is a 29 year old female . She was being treated for BV. Medication caused yeast infection while taking. She did not complete medication. She will return for self swab and treat accordingly. Past Medical History:  Diagnosis Date  . Herpes genitalia   . Migraines   . Trichimoniasis   . Yeast infection     Current Outpatient Medications on File Prior to Visit  Medication Sig Dispense Refill  . Etonogestrel (IMPLANON [place]) Inject 1 application into the skin once.    . metroNIDAZOLE (FLAGYL) 500 MG tablet Take 1 tablet (500 mg total) by mouth 2 (two) times daily. (Patient not taking: Reported on 02/19/2020) 14 tablet 0  . SUMAtriptan (IMITREX) 50 MG tablet Take 1 tablet (50 mg total) by mouth every 2 (two) hours as needed for migraine. May repeat in 2 hours if headache persists or recurs. (Patient not taking: Reported on 01/30/2020) 10 tablet 3  . [DISCONTINUED] escitalopram (LEXAPRO) 10 MG tablet Take 1 tablet (10 mg total) by mouth daily. (Patient not taking: Reported on 11/24/2019) 30 tablet 1   No current facility-administered medications on file prior to visit.   Observations/Objective: Review of Systems  Gastrointestinal: Positive for abdominal pain.  Genitourinary:       Vagnial discharge   All other systems reviewed and are negative.   Assessment and Plan:. Neesa was seen today for vaginitis.  Diagnoses and all  orders for this visit:  Vaginal irritation -     Cervicovaginal ancillary only; Future   Follow Up Instructions:    I discussed the assessment and treatment plan with the patient. The patient was provided an opportunity to ask questions and all were answered. The patient agreed with the plan and demonstrated an understanding of the instructions.   The patient was advised to call back or seek an in-person evaluation if the symptoms worsen or if the condition fails to improve as anticipated.  I provided 8 minutes of non-face-to-face time during this encounter.   [name], NP

## 2020-03-02 ENCOUNTER — Encounter (INDEPENDENT_AMBULATORY_CARE_PROVIDER_SITE_OTHER): Payer: Self-pay | Admitting: Primary Care

## 2020-03-02 ENCOUNTER — Ambulatory Visit (INDEPENDENT_AMBULATORY_CARE_PROVIDER_SITE_OTHER): Payer: Self-pay | Admitting: Primary Care

## 2020-03-02 ENCOUNTER — Other Ambulatory Visit: Payer: Self-pay

## 2020-03-02 VITALS — BP 103/63 | HR 60 | Temp 98.1°F | Ht 67.0 in | Wt 204.6 lb

## 2020-03-02 DIAGNOSIS — F321 Major depressive disorder, single episode, moderate: Secondary | ICD-10-CM

## 2020-03-02 MED ORDER — ESCITALOPRAM OXALATE 20 MG PO TABS: 20.0000 mg | ORAL_TABLET | Freq: Every day | ORAL | 0 refills | Status: DC

## 2020-03-02 NOTE — Progress Notes (Signed)
Established Patient Office Visit  Subjective:  Patient ID: Jeanne Chaney, female    DOB: 11-Dec-1990  Age: 29 y.o. MRN: [id number]  CC:  Chief Complaint  Patient presents with   Depression   Anxiety    HPI Jeanne Chaney presents for depression and anxiety. She express feeling suicidial but no thought out plan, at times feels better off dead, father is dying has stage 4 cancer- her world feel crumbling down. Views herself as a failure started college dropped out in a relation ship did not work. Never picked up her Lexapro from previous visit. Took patient in the b/r asked her what did she see. Name what she see and likes her eyes, her smile . I see a beautiful young black woman that can conquer the world she just needs to put forth the effort. Patient stops crying and laughing we go back and complete our visit .   Past Medical History:  Diagnosis Date   Herpes genitalia    Migraines    Trichimoniasis    Yeast infection     History reviewed. No pertinent surgical history.  Family History  Problem Relation Age of Onset   Asthma Mother     Social History   Socioeconomic History   Marital status: Single    Spouse name: Not on file   Number of children: Not on file   Years of education: Not on file   Highest education level: Not on file  Occupational History   Not on file  Tobacco Use   Smoking status: Never Smoker   Smokeless tobacco: Never Used  Vaping Use   Vaping Use: Never used  Substance and Sexual Activity   Alcohol use: No   Drug use: No   Sexual activity: Yes    Birth control/protection: Implant  Other Topics Concern   Not on file  Social History Narrative   Not on file   Social Determinants of Health   Financial Resource Strain:    Difficulty of Paying Living Expenses:   Food Insecurity:    Worried About Programme researcher, broadcasting/film/video in the Last Year:    Barista in the Last Year:   Transportation Needs:    Automotive engineer (Medical):    Lack of Transportation (Non-Medical):   Physical Activity:    Days of Exercise per Week:    Minutes of Exercise per Session:   Stress:    Feeling of Stress :   Social Connections:    Frequency of Communication with Friends and Family:    Frequency of Social Gatherings with Friends and Family:    Attends Religious Services:    Active Member of Clubs or Organizations:    Attends Engineer, structural:    Marital Status:   Intimate Partner Violence:    Fear of Current or Ex-Partner:    Emotionally Abused:    Physically Abused:    Sexually Abused:     Outpatient Medications Prior to Visit  Medication Sig Dispense Refill   Etonogestrel (IMPLANON [place]) Inject 1 application into the skin once.     SUMAtriptan (IMITREX) 50 MG tablet Take 1 tablet (50 mg total) by mouth every 2 (two) hours as needed for migraine. May repeat in 2 hours if headache persists or recurs. (Patient not taking: Reported on 01/30/2020) 10 tablet 3   escitalopram (LEXAPRO) 10 MG tablet Take 1 tablet (10 mg total) by mouth daily. (Patient not taking: Reported on 11/24/2019) 30 tablet  1   metroNIDAZOLE (FLAGYL) 500 MG tablet Take 1 tablet (500 mg total) by mouth 2 (two) times daily. (Patient not taking: Reported on 02/19/2020) 14 tablet 0   No facility-administered medications prior to visit.    No Known Allergies  ROS Review of Systems  Psychiatric/Behavioral: Positive for sleep disturbance and suicidal ideas. The patient is nervous/anxious.        Depression  All other systems reviewed and are negative.     Objective:    Physical Exam Vitals reviewed.  Constitutional:      Appearance: She is obese.  HENT:     Right Ear: Tympanic membrane normal.     Left Ear: Tympanic membrane normal.     Nose: Nose normal.     Mouth/Throat:     Mouth: Mucous membranes are moist.  Cardiovascular:     Rate and Rhythm: Normal rate and regular rhythm.     Pulses: Normal  pulses.     Heart sounds: Normal heart sounds.  Pulmonary:     Effort: Pulmonary effort is normal.     Breath sounds: Normal breath sounds.  Abdominal:     General: Bowel sounds are normal.  Musculoskeletal:        General: Normal range of motion.     Cervical back: Normal range of motion and neck supple.  Skin:    General: Skin is warm and dry.  Neurological:     Mental Status: She is alert and oriented to person, place, and time.  Psychiatric:     Comments: See HPI      BP 103/63 (BP Location: Right Arm, Patient Position: Sitting, Cuff Size: Large)    Pulse 60    Temp 98.1 F (36.7 C) (Oral)    Ht 5\' 7"  (1.702 m)    Wt 204 lb 9.6 oz (92.8 kg)    LMP 02/16/2020 (Approximate)    SpO2 99%    BMI 32.04 kg/m  Wt Readings from Last 3 Encounters:  03/02/20 204 lb 9.6 oz (92.8 kg)  01/30/20 206 lb 3.2 oz (93.5 kg)  11/24/19 220 lb (99.8 kg)     Health Maintenance Due  Topic Date Due   COVID-19 Vaccine (1) Never done    There are no preventive care reminders to display for this patient.  No results found for: TSH Lab Results  Component Value Date   WBC 4.3 01/30/2020   HGB 11.0 (L) 01/30/2020   HCT 35.7 01/30/2020   MCV 84 01/30/2020   PLT 243 01/30/2020   Lab Results  Component Value Date   NA 139 07/15/2018   K 3.7 07/15/2018   CO2 23 07/15/2018   GLUCOSE 90 07/15/2018   BUN 12 07/15/2018   CREATININE 0.67 07/15/2018   BILITOT <0.2 07/15/2018   ALKPHOS 73 07/15/2018   AST 15 07/15/2018   ALT 16 07/15/2018   PROT 7.3 07/15/2018   ALBUMIN 4.6 07/15/2018   CALCIUM 9.3 07/15/2018   ANIONGAP 7 04/04/2017   No results found for: CHOL No results found for: HDL No results found for: LDLCALC No results found for: TRIG No results found for: CHOLHDL Lab Results  Component Value Date   HGBA1C 5.9 04/30/2017      Assessment & Plan:  Jeanne Chaney was seen today for depression and anxiety.  Diagnoses and all orders for this visit:  Current moderate episode of  major depressive disorder, unspecified whether recurrent (HCC) Spent intense time discussing low self esteem, feeling of sucicidal thoughts no thought  out plan, downward spend to where and why she was here depressed.  -     escitalopram (LEXAPRO) 20 MG tablet; Take 1 tablet (20 mg total) by mouth daily.    Meds ordered this encounter  Medications   escitalopram (LEXAPRO) 20 MG tablet    Sig: Take 1 tablet (20 mg total) by mouth daily.    Dispense:  90 tablet    Refill:  0    Follow-up: Return in about 4 weeks (around 03/30/2020) for in person depression/tues .    [name], NP

## 2020-03-02 NOTE — Patient Instructions (Signed)
Depression is when you feel down, blue or sad for at least 2 weeks in a row. You may experience increaset increase in sleeping, eating or  loss of interest in doing things that once gave you pleasure. Feeling worthless, guilty, nervous and low self esteem, avoiding interaction with other people or increase agitation.  This is not you believe in your self , look in the mirror to see a beautiful young strong Black woman. Dry those tears and smile.

## 2020-04-15 ENCOUNTER — Encounter (HOSPITAL_COMMUNITY): Payer: Self-pay

## 2020-04-15 ENCOUNTER — Other Ambulatory Visit: Payer: Self-pay

## 2020-04-15 ENCOUNTER — Ambulatory Visit (HOSPITAL_COMMUNITY)
Admission: EM | Admit: 2020-04-15 | Discharge: 2020-04-15 | Disposition: A | Discharge status: Home / Self Care | Payer: Self-pay | Attending: Family Medicine | Admitting: Family Medicine

## 2020-04-15 DIAGNOSIS — R1013 Epigastric pain: Secondary | ICD-10-CM

## 2020-04-15 DIAGNOSIS — R071 Chest pain on breathing: Secondary | ICD-10-CM

## 2020-04-15 MED ORDER — TRAMADOL HCL 50 MG PO TABS: 50.0000 mg | ORAL_TABLET | Freq: Four times a day (QID) | ORAL | 0 refills | Status: DC | PRN

## 2020-04-15 MED ORDER — OMEPRAZOLE 20 MG PO CPDR
20.0000 mg | DELAYED_RELEASE_CAPSULE | Freq: Two times a day (BID) | ORAL | 0 refills | Status: DC
Start: 2020-04-15 — End: 2021-03-30

## 2020-04-15 NOTE — ED Provider Notes (Addendum)
[facility]    CSN: [id number] Arrival date & time: 04/15/20  1724      History   Chief Complaint Chief Complaint  Patient presents with  . Chest Pain  . Back Pain    HPI Jeanne Chaney is a 29 y.o. female.   HPI  Patient is here for chest and back pain.  Is been present all day.  Was not there yesterday.  No change in activity.  No trauma or injury.  No illness or shortness of breath.  No nausea or change in appetite.  She points to her epigastric region and lower sternum as the area of pain.  It does hurt with deep breath.  Certain movements.  The pain radiates into her left shoulder blade region.  She has no hypertension, hyperlipidemia, diabetes, or history of heart disease in her family history.  No history of lung disease or asthma.  No exposure to Covid.  Has not had Covid vaccinations.  Past Medical History:  Diagnosis Date  . Herpes genitalia   . Migraines   . Trichimoniasis   . Yeast infection     Patient Active Problem List   Diagnosis Date Noted  . Vaginal irritation 04/30/2017  . 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 Pap smear for cervical cancer screening 10/06/2016    History reviewed. No pertinent surgical history.  OB History    Gravida  2   Para  2   Term  2   Preterm      AB      Living  2     SAB      TAB      Ectopic      Multiple      Live Births  2            Home Medications    Prior to Admission medications   Medication Sig Start Date End Date Taking? Authorizing Provider  escitalopram (LEXAPRO) 20 MG tablet Take 1 tablet (20 mg total) by mouth daily. 03/02/20   [name], NP  Etonogestrel (IMPLANON [place]) Inject 1 application into the skin once.    [provider]  omeprazole (PRILOSEC) 20 MG capsule Take 1 capsule (20 mg total) by mouth 2 (two) times daily before a meal. 04/15/20   Eustace Moore, MD  SUMAtriptan (IMITREX) 50 MG tablet Take 1 tablet (50 mg total) by mouth every 2  (two) hours as needed for migraine. May repeat in 2 hours if headache persists or recurs. Patient not taking: Reported on 01/30/2020 08/27/19   [name], NP  traMADol (ULTRAM) 50 MG tablet Take 1 tablet (50 mg total) by mouth every 6 (six) hours as needed. 04/15/20   Eustace Moore, MD    Family History Family History  Problem Relation Age of Onset  . Asthma Mother     Social History Social History   Tobacco Use  . Smoking status: Never Smoker  . Smokeless tobacco: Never Used  Vaping Use  . Vaping Use: Never used  Substance Use Topics  . Alcohol use: No  . Drug use: No     Allergies   Patient has no known allergies.   Review of Systems Review of Systems See HPI  Physical Exam Triage Vital Signs ED Triage Vitals  Enc Vitals Group     BP 04/15/20 1747 (!) 113/58     Pulse Rate 04/15/20 1747 85     Resp 04/15/20 1747 18  Temp 04/15/20 1747 99.1 F (37.3 C)     Temp Source 04/15/20 1747 Oral     SpO2 04/15/20 1747 100 %     Weight --      Height --      Head Circumference --      Peak Flow --      Pain Score 04/15/20 1745 10     Pain Loc --      Pain Edu? --      Excl. in GC? --    No data found.  Updated Vital Signs BP (!) 113/58 (BP Location: Right Arm)   Pulse 85   Temp 99.1 F (37.3 C) (Oral)   Resp 18   LMP 04/12/2020 (Exact Date)   SpO2 100%      Physical Exam Constitutional:      General: She is not in acute distress.    Appearance: She is well-developed.  HENT:     Head: Normocephalic and atraumatic.  Eyes:     Conjunctiva/sclera: Conjunctivae normal.     Pupils: Pupils are equal, round, and reactive to light.  Cardiovascular:     Rate and Rhythm: Normal rate and regular rhythm.     Heart sounds: Normal heart sounds. No murmur heard.   Pulmonary:     Effort: Pulmonary effort is normal. No respiratory distress.     Breath sounds: Normal breath sounds.  Chest:     Chest wall: No tenderness.  Abdominal:     General:  There is no distension.     Palpations: Abdomen is soft.     Tenderness: There is abdominal tenderness.     Comments: Midepigastric tenderness  Musculoskeletal:        General: Normal range of motion.     Cervical back: Normal range of motion.  Skin:    General: Skin is warm and dry.  Neurological:     General: No focal deficit present.     Mental Status: She is alert.  Psychiatric:        Behavior: Behavior normal.      UC Treatments / Results  Labs (all labs ordered are listed, but only abnormal results are displayed) Labs Reviewed - No data to display  EKG-normal sinus rhythm.  Normal intervals.  Normal and rhythm.  No ST or T wave changes   Radiology No results found.  Procedures Procedures (including critical care time)  Medications Ordered in UC Medications - No data to display  Initial Impression / Assessment and Plan / UC Course  I have reviewed the triage vital signs and the nursing notes.  Pertinent labs & imaging results that were available during my care of the patient were reviewed by me and considered in my medical decision making (see chart for details).     Appears acutely uncomfortable.  She is also moderately anxious. Her I think she is having epigastric pain that is causing her symptoms.  She does have midepigastric tenderness.  She does not understand why this is happening.  Told her it could be stress.  Could be bacterial or viral infection.  Will treat with omeprazole.  Tramadol for pain.  Follow-up with her PCP Final Clinical Impressions(s) / UC Diagnoses   Final diagnoses:  Abdominal pain, epigastric  Chest pain varying with breathing     Discharge Instructions     Take omeprazole 2 times a day on an empty stomach This will reduce your stomach acid and the abdominal pain Take tramadol if needed for  chest pain and back pain Rest.  Drink plenty of water Bland diet for a few days See your primary care doctor if not improving by  Monday   ED Prescriptions    Medication Sig Dispense Auth. Provider   omeprazole (PRILOSEC) 20 MG capsule Take 1 capsule (20 mg total) by mouth 2 (two) times daily before a meal. 30 capsule Eustace Moore, MD   traMADol (ULTRAM) 50 MG tablet Take 1 tablet (50 mg total) by mouth every 6 (six) hours as needed. 15 tablet Eustace Moore, MD     I have reviewed the PDMP during this encounter.   Eustace Moore, MD 04/15/20 2152    Eustace Moore, MD 04/15/20 2153

## 2020-04-15 NOTE — Discharge Instructions (Addendum)
Take omeprazole 2 times a day on an empty stomach This will reduce your stomach acid and the abdominal pain Take tramadol if needed for chest pain and back pain Rest.  Drink plenty of water Bland diet for a few days See your primary care doctor if not improving by Monday

## 2020-04-15 NOTE — ED Triage Notes (Addendum)
Presents with let sided chest pain radiates to the back. Pt states chest pain feels stabbing and pressure.Pain is worse with deep breathing and laughing. Denies weakness, SOB,nausea.

## 2020-08-19 ENCOUNTER — Ambulatory Visit (HOSPITAL_COMMUNITY): Admission: EM | Admit: 2020-08-19 | Discharge: 2020-08-19 | Discharge status: Against Medical Advice | Payer: Self-pay

## 2020-08-19 ENCOUNTER — Other Ambulatory Visit: Payer: Self-pay

## 2020-08-19 NOTE — ED Notes (Signed)
Called patient from the lobby twice no answer

## 2020-10-26 ENCOUNTER — Ambulatory Visit (INDEPENDENT_AMBULATORY_CARE_PROVIDER_SITE_OTHER): Payer: Self-pay

## 2020-10-26 ENCOUNTER — Other Ambulatory Visit: Payer: Self-pay

## 2020-10-26 ENCOUNTER — Encounter (HOSPITAL_COMMUNITY): Payer: Self-pay

## 2020-10-26 ENCOUNTER — Ambulatory Visit (HOSPITAL_COMMUNITY)
Admission: EM | Admit: 2020-10-26 | Discharge: 2020-10-26 | Disposition: A | Discharge status: Home / Self Care | Payer: Self-pay | Attending: Student | Admitting: Student

## 2020-10-26 DIAGNOSIS — M25562 Pain in left knee: Secondary | ICD-10-CM

## 2020-10-26 DIAGNOSIS — S86912A Strain of unspecified muscle(s) and tendon(s) at lower leg level, left leg, initial encounter: Secondary | ICD-10-CM

## 2020-10-26 MED ORDER — NAPROXEN 500 MG PO TABS: 500.0000 mg | ORAL_TABLET | Freq: Two times a day (BID) | ORAL | 0 refills | Status: AC

## 2020-10-26 NOTE — ED Provider Notes (Signed)
[facility]    CSN: [id number] Arrival date & time: 10/26/20  0844      History   Chief Complaint Chief Complaint  Patient presents with  . Knee Pain    HPI Jeanne Chaney is a 30 y.o. female presenting with nontraumatic left knee pain x5 days.  History right knee pain, right knee contusion, genital herpes, migraines, trichomoniasis, yeast infection. -States she has had pain with bending and standing/walking on her left knee for the last 5 days.  She denies trauma, but states that she walks a lot at work.  Denies sensation changes, pain radiating down her leg, back pain. Denies numbness or weakness in the arms or legs. Denies urinary symptoms.  HPI  Past Medical History:  Diagnosis Date  . Herpes genitalia   . Migraines   . Trichimoniasis   . Yeast infection     Patient Active Problem List   Diagnosis Date Noted  . Vaginal irritation 04/30/2017  . 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 Pap smear for cervical cancer screening 10/06/2016    History reviewed. No pertinent surgical history.  OB History    Gravida  2   Para  2   Term  2   Preterm      AB      Living  2     SAB      IAB      Ectopic      Multiple      Live Births  2            Home Medications    Prior to Admission medications   Medication Sig Start Date End Date Taking? Authorizing Provider  naproxen (NAPROSYN) 500 MG tablet Take 1 tablet (500 mg total) by mouth 2 (two) times daily for 7 days. 10/26/20 11/02/20 Yes Rhys Martini, PA-C  escitalopram (LEXAPRO) 20 MG tablet Take 1 tablet (20 mg total) by mouth daily. 03/02/20   [name], NP  Etonogestrel (IMPLANON [place]) Inject 1 application into the skin once.    [provider]  omeprazole (PRILOSEC) 20 MG capsule Take 1 capsule (20 mg total) by mouth 2 (two) times daily before a meal. 04/15/20   Eustace Moore, MD  SUMAtriptan (IMITREX) 50 MG tablet Take 1 tablet (50 mg total) by mouth every 2 (two)  hours as needed for migraine. May repeat in 2 hours if headache persists or recurs. Patient not taking: Reported on 01/30/2020 08/27/19   [name], NP  traMADol (ULTRAM) 50 MG tablet Take 1 tablet (50 mg total) by mouth every 6 (six) hours as needed. 04/15/20   Eustace Moore, MD    Family History Family History  Problem Relation Age of Onset  . Asthma Mother     Social History Social History   Tobacco Use  . Smoking status: Never Smoker  . Smokeless tobacco: Never Used  Vaping Use  . Vaping Use: Never used  Substance Use Topics  . Alcohol use: No  . Drug use: No     Allergies   Patient has no known allergies.   Review of Systems Review of Systems  Musculoskeletal:       L knee pain  All other systems reviewed and are negative.    Physical Exam Triage Vital Signs ED Triage Vitals  Enc Vitals Group     BP 10/26/20 0950 116/63     Pulse Rate 10/26/20 0950 72     Resp 10/26/20  0950 18     Temp 10/26/20 0950 98.1 F (36.7 C)     Temp Source 10/26/20 0950 Oral     SpO2 10/26/20 0950 99 %     Weight --      Height --      Head Circumference --      Peak Flow --      Pain Score 10/26/20 0949 6     Pain Loc --      Pain Edu? --      Excl. in GC? --    No data found.  Updated Vital Signs BP 116/63 (BP Location: Right Arm)   Pulse 72   Temp 98.1 F (36.7 C) (Oral)   Resp 18   LMP 10/14/2020   SpO2 99%   Visual Acuity Right Eye Distance:   Left Eye Distance:   Bilateral Distance:    Right Eye Near:   Left Eye Near:    Bilateral Near:     Physical Exam Vitals reviewed.  Constitutional:      Appearance: Normal appearance.  HENT:     Head: Normocephalic and atraumatic.  Cardiovascular:     Rate and Rhythm: Normal rate and regular rhythm.     Heart sounds: Normal heart sounds.  Pulmonary:     Effort: Pulmonary effort is normal.     Breath sounds: Normal breath sounds.  Musculoskeletal:     Right knee: Normal.     Left knee:  Crepitus present. No swelling, deformity, effusion, erythema, ecchymosis or lacerations. Normal range of motion. Tenderness present over the MCL. No medial joint line, lateral joint line, LCL, ACL, PCL or patellar tendon tenderness. No LCL laxity, MCL laxity, ACL laxity or PCL laxity.Normal alignment, normal meniscus and normal patellar mobility. Normal pulse.     Instability Tests: Anterior drawer test negative. Posterior drawer test negative. Anterior Lachman test negative. Medial McMurray test negative and lateral McMurray test negative.     Comments: No cervical, thoracic, lumbar spinous or paraspinous tenderness.  No deformity or step-off.  L knee full ROM and strength. Pain with palpation alone MCL. Crepitus with flexion and extension. Pain elicited with flexion and standing/walking. No joint laxity, negative McMurray. Gait normal. Sensation intact.  Neurological:     General: No focal deficit present.     Mental Status: She is alert and oriented to person, place, and time.  Psychiatric:        Mood and Affect: Mood normal.        Behavior: Behavior normal.        Thought Content: Thought content normal.        Judgment: Judgment normal.      UC Treatments / Results  Labs (all labs ordered are listed, but only abnormal results are displayed) Labs Reviewed - No data to display  EKG   Radiology DG Knee Complete 4 Views Left  Result Date: 10/26/2020 CLINICAL DATA:  Left knee pain. EXAM: LEFT KNEE - COMPLETE 4+ VIEW COMPARISON:  No prior. FINDINGS: No acute bony or joint abnormality identified. No evidence of fracture or dislocation. IMPRESSION: No acute abnormality. Electronically Signed   By: Maisie Fus  Register   On: 10/26/2020 11:15    Procedures Procedures (including critical care time)  Medications Ordered in UC Medications - No data to display  Initial Impression / Assessment and Plan / UC Course  I have reviewed the triage vital signs and the nursing notes.  Pertinent  labs & imaging results that were available during  my care of the patient were reviewed by me and considered in my medical decision making (see chart for details).      This patient is a 30 year old female presenting with nontraumatic L knee pain. No prior injury.  Fairly benign exam. Discussed that an xray will likely be normal; patient wishes to proceed with xray.   Xray L knee negative. Films interpreted by myself and radiologist.  Rec naproxen, knee brace for 1-2 weeks. F/u with ortho if symptoms worsen or persist. Information provided.  Return precautions discussed.  This chart was dictated using voice recognition software, Dragon. Despite the best efforts of this provider to proofread and correct errors, errors may still occur which can change documentation meaning.   Final Clinical Impressions(s) / UC Diagnoses   Final diagnoses:  Knee strain, left, initial encounter     Discharge Instructions     -Take the naproxen, twice daily for 7 days. Take this with food like with breakfast and dinner. This will help with pain and inflammation. -Use your knee sleeve during the day and while walking for the next 7-10 days. You can stop using this when your pain resolves.  -Ice your knee and elevate your leg at the end of the day. -try to minimize extensive standing and walking until your pain resolves. -Follow-up with orthopedist if pain persists >10 days- information below.    ED Prescriptions    Medication Sig Dispense Auth. Provider   naproxen (NAPROSYN) 500 MG tablet Take 1 tablet (500 mg total) by mouth 2 (two) times daily for 7 days. 14 tablet Rhys Martini, PA-C     PDMP not reviewed this encounter.   Rhys Martini, PA-C 10/26/20 1152

## 2020-10-26 NOTE — Discharge Instructions (Addendum)
-  Take the naproxen, twice daily for 7 days. Take this with food like with breakfast and dinner. This will help with pain and inflammation. -Use your knee sleeve during the day and while walking for the next 7-10 days. You can stop using this when your pain resolves.  -Ice your knee and elevate your leg at the end of the day. -try to minimize extensive standing and walking until your pain resolves. -Follow-up with orthopedist if pain persists >10 days- information below.

## 2020-10-26 NOTE — ED Triage Notes (Signed)
Pt c/o right knee pain x 5 days. Pt states it hurts to bend the knee.

## 2020-11-17 ENCOUNTER — Ambulatory Visit (INDEPENDENT_AMBULATORY_CARE_PROVIDER_SITE_OTHER): Payer: Medicaid Other | Admitting: Primary Care

## 2021-03-30 ENCOUNTER — Inpatient Hospital Stay (HOSPITAL_COMMUNITY)
Admission: AD | Admit: 2021-03-30 | Discharge: 2021-04-03 | DRG: 885 | Disposition: A | Discharge status: Home / Self Care | Payer: Federal, State, Local not specified - Other | Source: Intra-hospital | Attending: Psychiatry | Admitting: Psychiatry

## 2021-03-30 ENCOUNTER — Other Ambulatory Visit: Payer: Self-pay

## 2021-03-30 ENCOUNTER — Ambulatory Visit (HOSPITAL_COMMUNITY)
Admission: EM | Admit: 2021-03-30 | Discharge: 2021-03-30 | Disposition: A | Discharge status: Other Acute Inpatient Hospital | Payer: No Payment, Other | Attending: Student in an Organized Health Care Education/Training Program | Admitting: Student in an Organized Health Care Education/Training Program

## 2021-03-30 DIAGNOSIS — Z20822 Contact with and (suspected) exposure to covid-19: Secondary | ICD-10-CM | POA: Insufficient documentation

## 2021-03-30 DIAGNOSIS — F332 Major depressive disorder, recurrent severe without psychotic features: Secondary | ICD-10-CM | POA: Diagnosis present

## 2021-03-30 DIAGNOSIS — R45851 Suicidal ideations: Secondary | ICD-10-CM | POA: Diagnosis present

## 2021-03-30 DIAGNOSIS — Z79899 Other long term (current) drug therapy: Secondary | ICD-10-CM | POA: Diagnosis not present

## 2021-03-30 DIAGNOSIS — Z9151 Personal history of suicidal behavior: Secondary | ICD-10-CM | POA: Insufficient documentation

## 2021-03-30 DIAGNOSIS — F1290 Cannabis use, unspecified, uncomplicated: Secondary | ICD-10-CM | POA: Insufficient documentation

## 2021-03-30 DIAGNOSIS — K219 Gastro-esophageal reflux disease without esophagitis: Secondary | ICD-10-CM | POA: Diagnosis present

## 2021-03-30 DIAGNOSIS — G4700 Insomnia, unspecified: Secondary | ICD-10-CM | POA: Diagnosis present

## 2021-03-30 DIAGNOSIS — F419 Anxiety disorder, unspecified: Secondary | ICD-10-CM | POA: Diagnosis present

## 2021-03-30 LAB — URINALYSIS, ROUTINE W REFLEX MICROSCOPIC
Bacteria, UA: NONE SEEN
Bilirubin Urine: NEGATIVE
Glucose, UA: NEGATIVE mg/dL
Hgb urine dipstick: NEGATIVE
Ketones, ur: 5 mg/dL — AB
Leukocytes,Ua: NEGATIVE
Nitrite: NEGATIVE
Protein, ur: 30 mg/dL — AB
Specific Gravity, Urine: 1.024 (ref 1.005–1.030)
pH: 7 (ref 5.0–8.0)

## 2021-03-30 LAB — POCT URINE DRUG SCREEN - MANUAL ENTRY (I-SCREEN)
POC Amphetamine UR: NOT DETECTED
POC Buprenorphine (BUP): NOT DETECTED
POC Cocaine UR: NOT DETECTED
POC Marijuana UR: POSITIVE — AB
POC Methadone UR: NOT DETECTED
POC Methamphetamine UR: NOT DETECTED
POC Morphine: NOT DETECTED
POC Oxazepam (BZO): NOT DETECTED
POC Oxycodone UR: NOT DETECTED
POC Secobarbital (BAR): NOT DETECTED

## 2021-03-30 LAB — RESP PANEL BY RT-PCR (FLU A&B, COVID) ARPGX2
Influenza A by PCR: NEGATIVE
Influenza B by PCR: NEGATIVE
SARS Coronavirus 2 by RT PCR: NEGATIVE

## 2021-03-30 LAB — POCT PREGNANCY, URINE: Preg Test, Ur: NEGATIVE

## 2021-03-30 LAB — POC SARS CORONAVIRUS 2 AG -  ED: SARS Coronavirus 2 Ag: NEGATIVE

## 2021-03-30 LAB — POC SARS CORONAVIRUS 2 AG: SARSCOV2ONAVIRUS 2 AG: NEGATIVE

## 2021-03-30 MED ORDER — ALUM & MAG HYDROXIDE-SIMETH 200-200-20 MG/5ML PO SUSP: 30.0000 mL | ORAL | Status: DC | PRN

## 2021-03-30 MED ORDER — MAGNESIUM HYDROXIDE 400 MG/5ML PO SUSP: 30.0000 mL | Freq: Every day | ORAL | Status: DC | PRN

## 2021-03-30 MED ORDER — HYDROXYZINE HCL 25 MG PO TABS
25.0000 mg | ORAL_TABLET | Freq: Three times a day (TID) | ORAL | Status: DC | PRN
  Administered 2021-03-31 – 2021-04-02 (×6): 25 mg via ORAL
  Filled 2021-03-30 (×6): qty 1

## 2021-03-30 MED ORDER — TRAZODONE HCL 50 MG PO TABS: 50.0000 mg | ORAL_TABLET | Freq: Every evening | ORAL | Status: DC | PRN

## 2021-03-30 MED ORDER — ACETAMINOPHEN 325 MG PO TABS: 650.0000 mg | ORAL_TABLET | Freq: Four times a day (QID) | ORAL | Status: DC | PRN

## 2021-03-30 NOTE — ED Provider Notes (Addendum)
[facility] Medical Screening Exam  Patient Name: Jeanne Chaney MRN: [id number] Date of Evaluation: 03/30/21 Chief Complaint:   Depression Diagnosis:  Final diagnoses:  Severe episode of recurrent major depressive disorder, without psychotic features (HCC)    History of Present illness: Jeanne Chaney is a 30 y.o. female w/ past psychiatric hx of depression. Patient reports that she had one prior psych hospilization when she was 16 in [place], for SI. Patient reports she also saw a counselor when she was 30 yo for family and individual therapy for voicing SI while her parents were having difficulty in marriage.   Patient presented today voluntarily  for assessment via [facility] but there father of one of her children ([name]) also came and waited in the lobby. Patient was convinced to come in after she attempted to jump out of a second story window 2 times and [name] had to stop her. Patient reports "I got pretty far" when asked how far out of the window she got. Patient reported she tried to jump because" they would not get out of my way. I wanted to be alone." Patient reported that Terll and her mother were trying to talk to her and she was upset. Patient's daughter was also in the home.  On assessment patient reported that she attempted to jump Patient reported on assessment that her father died of cancer 7 months ago and on the 10th of every month (the anniversary) the patient has decompensated and had "episodes." Patient reports she has really been grieving the death of her father and she and her father were close. Patient report she has been having more frequent SI lately and finds herself feeling very hopeless reporting," I think, why am I here and what is my purpose." Patient reports she has an 11yo son and an 15 yo daughter and she has been thinking more frequently including today during assessment that they would be better off without her because " I can't take care of myself sometimes."  Patient reports that she has not been sleeping well, has lost 10-20lbs in the past 2 months unintentionally, has lassitude, decreased concentration, and anxiety. Patient denies a hx of manic symptoms. Patient denies HI and AVH. Patient reported that she had been provided Lexapro in the past but she never took the medication because she thought it would make her too tired to work. Patient denies self-injurious behavior and illicit substance use.   [name], reports he has been trying to get patient to come to [facility] for the past 3 weeks. He reports he and patient's mother are concerned about patient. He reports that patient voices that she is alone, but he denies that patient is truly alone and she does have family that are worried about her.   Psychiatric Specialty Exam  Presentation  General Appearance:Appropriate for Environment  Eye Contact:Fair  Speech:Clear and Coherent  Speech Volume:Increased  Handedness:No data recorded  Mood and Affect  Mood:Angry; Hopeless  Affect:Congruent   Thought Process  Thought Processes:Coherent  Descriptions of Associations:Intact  Orientation:Full (Time, Place and Person)  Thought Content:Illogical  Diagnosis of Schizophrenia or Schizoaffective disorder in past: No   Hallucinations:None  Ideas of Reference:None  Suicidal Thoughts:Yes, Passive  Homicidal Thoughts:No   Sensorium  Memory:Immediate Good; Recent Good; Remote Good  Judgment:Impaired  Insight:Shallow   Executive Functions  Concentration:Fair  Attention Span:Fair  Recall:Fair  Fund of Knowledge:Good  Language:Good   Psychomotor Activity  Psychomotor Activity:Normal   Assets  Assets:Communication Skills; Housing; Resilience  Sleep  Sleep:Poor  Number of hours:  No data recorded  No data recorded  Physical Exam: Physical Exam Constitutional:      Appearance: Normal appearance.     Comments: tearful  HENT:     Head: Normocephalic and atraumatic.      Nose: Nose normal.  Eyes:     Extraocular Movements: Extraocular movements intact.     Pupils: Pupils are equal, round, and reactive to light.  Cardiovascular:     Rate and Rhythm: Normal rate.     Pulses: Normal pulses.  Pulmonary:     Effort: Pulmonary effort is normal.  Musculoskeletal:        General: Normal range of motion.  Skin:    General: Skin is warm and dry.  Neurological:     General: No focal deficit present.     Mental Status: She is alert.   Review of Systems  Constitutional:  Negative for chills and fever.  HENT:  Negative for hearing loss.   Eyes:  Negative for blurred vision.  Respiratory:  Negative for cough and wheezing.   Cardiovascular:  Negative for chest pain.  Gastrointestinal:  Negative for abdominal pain.  Neurological:  Negative for dizziness.  Psychiatric/Behavioral:  Positive for depression and suicidal ideas. Negative for hallucinations and substance abuse. The patient has insomnia.   Blood pressure 129/84, pulse 66, temperature 98.7 F (37.1 C), resp. rate 16, SpO2 100 %. There is no height or weight on file to calculate BMI.  Musculoskeletal: Strength & Muscle Tone: within normal limits Gait & Station: normal Patient leans: N/A   [facility] MSE Discharge Disposition for Follow up and Recommendations: Based on my evaluation I certify that psychiatric inpatient services furnished can reasonably be expected to improve the patient's condition which I recommend transfer to an appropriate accepting facility.  MDD, severe, recurrent, w/o psychotic features Patient appears to be grieving however he grief had grown into a debilitating depression with anxiety leading to weight loss with frequent SI and what appears to be SA today. Patient is not safe to return home as she believes her attempt to jump out a 2nd story window to "be alone" was reasonable. Patient decided that she did not need care. Patient reported she needed to go to work and is rightfully  concerned about paying her bills to take care of her children. However, patient SI and inability to list any protective factor (not even her children) and hx of SI and possible SA, suggest she is high risk for suicide. Patient has a hx of poor compliance with her mental health and unfortunately this has led to patient's current presentation. Patient was IVC'd for her safety with the recommendation that she go inpatient.   Of note patient UDS was positive for THC.  - Inpatient hospitalization - Currently IVC'd  PGY-2 Bobbye Morton, MD 03/30/2021, 3:59 PM

## 2021-03-30 NOTE — ED Notes (Signed)
Pt calm and cooperative. No c/o of pain or distress. Will continue to monitor for safety 

## 2021-03-30 NOTE — ED Notes (Signed)
[facility] called to transport pt to [facility] 

## 2021-03-30 NOTE — BH Assessment (Addendum)
Comprehensive Clinical Assessment (CCA) Note  03/30/2021 Jeanne HamperRaven A Chaney [id number]  Disposition: Per Dr. Morrie SheldonMcQuilla, inpatient treatment is recommended.  Disposition SW to pursue appropriate inpatient options.  The patient demonstrates the following risk factors for suicide: Chronic risk factors for suicide include: psychiatric disorder of untreated depression and previous suicide attempts x1 inpt admission age 30 for SI, no attempt . Acute risk factors for suicide include: loss (financial, interpersonal, professional) and grief- lost father 7 mos ago to cancer . Protective factors for this patient include:  None, not even 2 children as "if I'm not a good mom, not good for them" . Considering these factors, the overall suicide risk at this point appears to be high. Patient is appropriate for outpatient follow up once stabilized.    Patient is a 30 year old female with a history of untreated depression who presents voluntarily via [employer] after she attempted to jump from the 2nd story window of her home.  Patient's boyfriend, Jeanne Chaney, and her two children were present during this incident.   Patient's boyfriend has to grab patient to keep her from falling. On arrival, patient continued to endorse SI and is very tearful and hopeless. She states she struggles on the 10th of each month, after losing her father to cancer 7 mos ago on the 10th.  She struggles to identify protective factors and cannot affirm her safety. She denies HI, AVH or SA hx. Patient also reports all depressive symptoms to include anergia, low motivation, poor sleep/appetite and feelings of hopelessness and worthlessness. She is not able to identify protective factors; not even her 2 kids stating, "If I'm not a good mother, then I'm not good for my kids." She denies other stressors outside of having a very difficult time grieving the loss of her father, as they were very close.  Treatment options were discussed and patient was initially open to  inpatient treatment.  Upon further discussion of inpatient recommendation, patient then states she didn't know we would keep her 3-5 days.  She is no longer voluntary for inpatient treatment and is insisting that she be allowed to leave.    Dr. Morrie SheldonMcQuilla has allowed patient's boyfriend to come back, as this be helpful in encouraging patient to consider inpatient treatment.    @16 :00 IVC initiated by Dr. Morrie SheldonMcQuilla.   Chief Complaint:  Chief Complaint  Patient presents with   Urgent Emergent Eval   Visit Diagnosis:  undiagnosed depression  [facility]  Thoughts that you would be better off dead, or of hurting yourself in some way More than half the days More than half the days Not at all  PHQ-9 Total Score 22 22 13       [facility]  C-SSRS RISK CATEGORY High Risk No Risk Error: Question 1 not populated        CCA Screening, Triage and Referral (STR)  Patient Reported Information How did you hear about us? Legal System  What Is the Reason for Your Visit/Call Today? Patient presents voluntarily via [employer] after she attempted to jump from the 2nd story window of her home.  On arrival, patient continued to endorse SI and is very tearful and  hopeless. She states she struggles on the 10th of each month, after losing her father to cancer 7 mos ago on the 10th.  She struggles to identify protective factors and cannot affirm her safety. She denies HI, AVH or SA hx.  How Long Has This Been Causing You Problems? > than 6 months  What Do You Feel Would Help You the Most Today? Treatment for Depression or other mood  problem   Have You Recently Had Any Thoughts About Hurting Yourself? Yes  Are You Planning to Commit Suicide/Harm Yourself At This time? Yes   Have you Recently Had Thoughts About Hurting Someone Karolee Ohs? No  Are You Planning to Harm Someone at This Time? No  Explanation: No data recorded  Have You Used Any Alcohol or Drugs in the Past 24 Hours? No  How Long Ago Did You Use Drugs or Alcohol? No data recorded What Did You Use and How Much? No data recorded  Do You Currently Have a Therapist/Psychiatrist? No data recorded Name of Therapist/Psychiatrist: No data recorded  Have You Been Recently Discharged From Any Office Practice or Programs? No data recorded Explanation of Discharge From Practice/Program: No data recorded    CCA Screening Triage Referral Assessment Type of Contact: No data recorded Telemedicine Service Delivery:   Is this Initial or Reassessment? No data recorded Date Telepsych consult ordered in [facility]:  No data recorded Time Telepsych consult ordered in [facility]:  No data recorded Location of Assessment: No data recorded Provider Location: No data recorded  Collateral Involvement: No data recorded  Does Patient Have a Court Appointed Legal Guardian? No data recorded Name and Contact of Legal Guardian: No data recorded If Minor and Not Living with Parent(s), Who has Custody? No data recorded Is CPS involved or ever been involved? No data recorded Is APS involved or ever been involved? No data recorded  Patient Determined To Be At Risk for Harm To Self or Others Based on Review of Patient Reported Information or Presenting Complaint? No data recorded Method: No data recorded Availability of Means: No data recorded Intent: No data recorded Notification Required: No data recorded Additional Information for Danger to Others Potential: No data recorded Additional Comments for Danger to Others Potential: No data recorded Are There Guns or Other Weapons in Your Home?  No data recorded Types of Guns/Weapons: No data recorded Are These Weapons Safely Secured?                            No data recorded Who Could Verify You Are Able To Have These Secured: No data recorded Do You Have any Outstanding Charges, Pending Court Dates, Parole/Probation? No data recorded Contacted To Inform of Risk of Harm To Self or Others: No data recorded   Does Patient Present under Involuntary Commitment? No data recorded IVC Papers Initial File Date: No data recorded  [place] of Residence: No data recorded  Patient Currently Receiving the Following Services: No data recorded  Determination of Need: Emergent (2 hours)   Options For Referral: Inpatient Hospitalization     CCA Biopsychosocial Patient Reported Schizophrenia/Schizoaffective Diagnosis in Past: No   Strengths: Full time employment, has support   Mental Health Symptoms Depression:   Change in energy/activity; Difficulty Concentrating; Fatigue; Hopelessness; Increase/decrease in appetite; Sleep (too much or little); Tearfulness; Weight gain/loss; Worthlessness   Duration of Depressive symptoms:  Duration of Depressive Symptoms: Greater than two weeks   Mania:   None  Anxiety:    Worrying; Tension; Difficulty concentrating   Psychosis:   None   Duration of Psychotic symptoms:    Trauma:   None   Obsessions:   None   Compulsions:   None   Inattention:   N/A   Hyperactivity/Impulsivity:   N/A   Oppositional/Defiant Behaviors:   N/A   Emotional Irregularity:   Chronic feelings of emptiness; Potentially harmful impulsivity; Recurrent suicidal behaviors/gestures/threats   Other Mood/Personality Symptoms:  No data recorded   Mental Status Exam Appearance and self-care  Stature:   Average   Weight:   Average weight   Clothing:   Casual   Grooming:   Normal   Cosmetic use:   Age appropriate   Posture/gait:   Normal   Motor activity:   Not Remarkable   Sensorium   Attention:   Normal   Concentration:   Normal   Orientation:   X5   Recall/memory:   Normal   Affect and Mood  Affect:   Depressed; Flat   Mood:   Depressed; Worthless; Pessimistic; Negative; Hopeless   Relating  Eye contact:   Normal   Facial expression:   Depressed; Sad   Attitude toward examiner:   Cooperative   Thought and Language  Speech flow:  Clear and Coherent   Thought content:   Appropriate to Mood and Circumstances   Preoccupation:   None   Hallucinations:   None   Organization:  No data recorded  [employer] of Knowledge:   Average   Intelligence:   Above Average   Abstraction:   Normal   Judgement:   Dangerous; Impaired   Reality Testing:   Adequate   Insight:   Lacking   Decision Making:   Impulsive; Vacilates   Social Functioning  Social Maturity:   Responsible   Social Judgement:   Normal   Stress  Stressors:   Grief/losses   Coping Ability:   Deficient supports; Exhausted; Overwhelmed   Skill Deficits:   Interpersonal; Self-control   Supports:   Friends/Service system; Family     Religion: Religion/Spirituality Are You A Religious Person?: No  Leisure/Recreation: Leisure / Recreation Do You Have Hobbies?: No  Exercise/Diet: Exercise/Diet Do You Exercise?: No Have You Gained or Lost A Significant Amount of Weight in the Past Six Months?: Yes-Lost Number of Pounds Lost?: 20 (in 2 mos) Do You Follow a Special Diet?: No Do You Have Any Trouble Sleeping?: Yes Explanation of Sleeping Difficulties: sleeping only 2 hrs per night for 8 + mos.   CCA Employment/Education Employment/Work Situation: Employment / Work Situation Employment Situation: Employed Work Stressors: Works at [employer] full time - no concerns Patient's Job has Been Impacted by Current Illness: No Has Patient ever Been in Equities trader?: No  Education: Education Is Patient Currently Attending  School?: No Last Grade Completed: 11 Did You Product manager?: No Did You Have An Individualized Education Program (IIEP): No Did You Have Any Difficulty At [employer]?: No Patient's Education Has Been Impacted by Current Illness: No   CCA Family/Childhood History Family and Relationship History: Family history Marital status: Long term relationship Long term relationship, how long?: 10 yrs What types of issues is patient dealing with in the relationship?: pt eludes to issues, however does not elaborate Additional relationship information: N/A Does patient have children?: Yes How many children?: 2 How is patient's relationship with their children?: Pt reports good relationship with 2 kids, ages 23 and 77.  Childhood History:  Childhood History By whom was/is the patient raised?: Both parents Did patient suffer any verbal/emotional/physical/sexual abuse as a child?: No Did patient suffer from severe childhood neglect?: No Has patient ever been sexually abused/assaulted/raped as an adolescent or adult?: No Was the patient ever a victim of a crime or a disaster?: No Witnessed domestic violence?: No Has patient been affected by domestic violence as an adult?: No  Child/Adolescent Assessment:     CCA Substance Use Alcohol/Drug Use: Alcohol / Drug Use Pain Medications: See MAR Prescriptions: See MAR Over the Counter: See MAR History of alcohol / drug use?: No history of alcohol / drug abuse       ASAM's:  Six Dimensions of Multidimensional Assessment  Dimension 1:  Acute Intoxication and/or Withdrawal Potential:      Dimension 2:  Biomedical Conditions and Complications:      Dimension 3:  Emotional, Behavioral, or Cognitive Conditions and Complications:     Dimension 4:  Readiness to Change:     Dimension 5:  Relapse, Continued use, or Continued Problem Potential:     Dimension 6:  Recovery/Living Environment:     ASAM Severity Score:    ASAM Recommended Level of  Treatment:     Substance use Disorder (SUD)    Recommendations for Services/Supports/Treatments:    Discharge Disposition:    DSM5 Diagnoses: Patient Active Problem List   Diagnosis Date Noted   Vaginal irritation 04/30/2017   Herpes genitalis in women 10/06/2016   Pap smear for cervical cancer screening 10/06/2016   Referrals to Alternative Service(s):  Yetta Glassman, [facility]

## 2021-03-30 NOTE — Progress Notes (Signed)
   03/30/21 1410  [facility] Triage Screening (Walk-ins at [facility] only)  How Did You Hear About [place]? Legal System  What Is the Reason for Your Visit/Call Today? Patient presents voluntarily via [employer] after she attempted to jump from the 2nd story window of her home.  On arrival, patient continued to endorse SI and is very tearful and hopeless. She states she struggles on the 10th of each month, after losing her father to cancer 7 mos ago on the 10th.  She struggles to identify protective factors and cannot affirm her safety. She denies HI, AVH or SA hx.  How Long Has This Been Causing You Problems? > than 6 months  Have You Recently Had Any Thoughts About Hurting Yourself? Yes  How long ago did you have thoughts about hurting yourself? Attempted to jump from 2nd story window today trying to end her life.  Are You Planning to Commit Suicide/Harm Yourself At This time? Yes  Have you Recently Had Thoughts About Hurting Someone Karolee Ohs? No  Are You Planning To Harm Someone At This Time? No  Are you currently experiencing any auditory, visual or other hallucinations? No  Have You Used Any Alcohol or Drugs in the Past 24 Hours? No  Do you have any current medical co-morbidities that require immediate attention? No  Clinician description of patient physical appearance/behavior: Tearful, withdrawn, cooperative  What Do You Feel Would Help You the Most Today? Treatment for Depression or other mood problem  If access to [facility] was not available, would you have sought care in the Emergency Department? Yes  Determination of Need Emergent (2 hours)  Options For Referral Inpatient Hospitalization

## 2021-03-30 NOTE — ED Notes (Signed)
Pt alert and oriented, and denies HI, A/VH, and any pain. Pt is cooperative. Education, support, reassurance, and encouragement provided, and belongings secured in locker. Pt denies any concerns at this time and ambulating on the unit with no issues. Pt remains safe on the unit while awaiting transfer to [facility] for IP admission.

## 2021-03-30 NOTE — ED Notes (Signed)
Report given to [email or web address]  adult unit

## 2021-03-31 ENCOUNTER — Encounter (HOSPITAL_COMMUNITY): Payer: Self-pay | Admitting: Student in an Organized Health Care Education/Training Program

## 2021-03-31 LAB — COMPREHENSIVE METABOLIC PANEL
ALT: 13 U/L (ref 0–44)
AST: 18 U/L (ref 15–41)
Albumin: 4.5 g/dL (ref 3.5–5.0)
Alkaline Phosphatase: 52 U/L (ref 38–126)
Anion gap: 10 (ref 5–15)
BUN: 10 mg/dL (ref 6–20)
CO2: 26 mmol/L (ref 22–32)
Calcium: 9.4 mg/dL (ref 8.9–10.3)
Chloride: 104 mmol/L (ref 98–111)
Creatinine, Ser: 0.64 mg/dL (ref 0.44–1.00)
GFR, Estimated: >60 mL/min (ref >60)
Glucose, Bld: 99 mg/dL (ref 70–99)
Potassium: 3.8 mmol/L (ref 3.5–5.1)
Sodium: 140 mmol/L (ref 135–145)
Total Bilirubin: 0.7 mg/dL (ref 0.3–1.2)
Total Protein: 7.4 g/dL (ref 6.5–8.1)

## 2021-03-31 LAB — HEMOGLOBIN A1C
Hgb A1c MFr Bld: 5.5 % (ref 4.8–5.6)
Mean Plasma Glucose: 111.15 mg/dL

## 2021-03-31 LAB — CBC
HCT: 38.5 % (ref 36.0–46.0)
Hemoglobin: 12.6 g/dL (ref 12.0–15.0)
MCH: 28.6 pg (ref 26.0–34.0)
MCHC: 32.7 g/dL (ref 30.0–36.0)
MCV: 87.5 fL (ref 80.0–100.0)
Platelets: 200 10*3/uL (ref 150–400)
RBC: 4.4 MIL/uL (ref 3.87–5.11)
RDW: 14.4 % (ref 11.5–15.5)
WBC: 4.2 10*3/uL (ref 4.0–10.5)
nRBC: 0 % (ref 0.0–0.2)

## 2021-03-31 LAB — LIPID PANEL
Cholesterol: 163 mg/dL (ref 0–200)
HDL: 57 mg/dL (ref >40)
LDL Cholesterol: 98 mg/dL (ref 0–99)
Total CHOL/HDL Ratio: 2.9 RATIO
Triglycerides: 40 mg/dL (ref <150)
VLDL: 8 mg/dL (ref 0–40)

## 2021-03-31 LAB — TSH: TSH: 1.549 u[IU]/mL (ref 0.350–4.500)

## 2021-03-31 MED ORDER — ADULT MULTIVITAMIN W/MINERALS CH
1.0000 | ORAL_TABLET | Freq: Every day | ORAL | Status: DC
  Administered 2021-03-31 – 2021-04-03 (×4): 1 via ORAL
  Filled 2021-03-31 (×6): qty 1

## 2021-03-31 MED ORDER — ENSURE ENLIVE PO LIQD
237.0000 mL | ORAL | Status: DC
  Administered 2021-03-31: 237 mL via ORAL
  Filled 2021-03-31 (×5): qty 237

## 2021-03-31 MED ORDER — BOOST / RESOURCE BREEZE PO LIQD CUSTOM
1.0000 | ORAL | Status: DC
  Administered 2021-04-01: 1 via ORAL
  Filled 2021-03-31 (×5): qty 1

## 2021-03-31 MED ORDER — CITALOPRAM HYDROBROMIDE 10 MG PO TABS
10.0000 mg | ORAL_TABLET | Freq: Every day | ORAL | Status: DC
  Administered 2021-03-31 – 2021-04-01 (×2): 10 mg via ORAL
  Filled 2021-03-31 (×3): qty 1

## 2021-03-31 MED ORDER — TRAZODONE HCL 50 MG PO TABS
25.0000 mg | ORAL_TABLET | Freq: Every evening | ORAL | Status: DC | PRN
  Administered 2021-03-31 – 2021-04-02 (×3): 25 mg via ORAL
  Filled 2021-03-31 (×3): qty 1

## 2021-03-31 NOTE — Progress Notes (Signed)
[facility] MD Progress Note  04/01/2021 10:28 AM Jeanne Chaney  MRN:  [id number]  CC: MDD and Suicide attempt  Subjective:  Jeanne Chaney is a 30 y.o. female, with reported past psychiatric history of depression. Patient presents INVOLUNTARILY to [facility] (03/30/2021), transferred from [facility] for worsening depression since death of father 7 months ago and SA by trying to jump out of a 23F window.  [facility] stay day 2.   24hr events:  The patient's chart was reviewed and nursing notes were reviewed. The patient's case was discussed in multidisciplinary team meeting.  Per MAR: - Patient is compliant with scheduled meds. - PRNs: Vistaril and trazodone for sleep Per RN notes, patient is improving, and that she is catching up on sleep. Patient slept Number of Hours: 5.   Today (04/01/2021): The patient was seen and evaluated on the unit.  On assessment today the patient reported that her deceased father was a huge source of support, having daily phone conversations.  Patient stated that she feels like her mom favors her brother over her, giving him more emotional support. Patient stated that currently her stepmother, 2 brothers, and nana are supportive, however they live in [place].  Prior to interview, she was on the phone with stepmother. In [place], she lives in an apartment alone with her 2 children.  However her biological mother and younger brother lives nearby. Stated that sleep was "okay, however I woke up early and could not fall back to sleep". Patient stated appetite was "still poor". Reported that her mood is "calm", that she does not feel angry right now. Discussed with patient for reason why she was not compliant with SSRIs in the past, and she reported it was due to misunderstanding of medications instruction.  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  Informed patient that she  can take it whenever she sleeps, as long as she is taking it daily. Patient denied SI/HI/AVH, delusions, paranoia, first rank symptoms.  Principal Problem: Severe episode of recurrent major depressive disorder, without psychotic features (HCC) Diagnosis: Principal Problem:   Severe episode of recurrent major depressive disorder, without psychotic features (HCC)  Total Time spent with patient: 30 minutes  Past Psychiatric History:  Patient stated that she had attempted to harm her self at a very young age and was placed in the counseling.  No medications or hospitalizations.  She also had thoughts of harming herself at approximately age 77.  She did not seek either psychiatric evaluations or therapy at that time.  No previous psychiatric medications.  Past Medical History:  Past Medical History:  Diagnosis Date   Herpes genitalia    Migraines    Trichimoniasis    Yeast infection    History reviewed. No pertinent surgical history. Family History:  Family History  Problem Relation Age of Onset   Asthma Mother    Family Psychiatric  History: See H&P Social History:  Social History   Substance and Sexual Activity  Alcohol Use No     Social History   Substance and Sexual Activity  Drug Use Yes   Types: Marijuana    Social History   Socioeconomic History   Marital status: Single    Spouse name: Not on file   Number of children: Not on file   Years of education: Not on file   Highest education level: Not on file  Occupational History   Not on file  Tobacco Use   Smoking status: Never   Smokeless tobacco: Never  Vaping Use   Vaping Use: Never used  Substance and Sexual Activity   Alcohol use: No   Drug use: Yes    Types: Marijuana   Sexual activity: Yes    Birth control/protection: Implant  Other Topics Concern   Not on file  Social History Narrative   Not on file   Social Determinants of Health   Financial Resource Strain: Not on file  Food Insecurity: Not  on file  Transportation Needs: Not on file  Physical Activity: Not on file  Stress: Not on file  Social Connections: Not on file   Additional Social History:    See H&P  Sleep: Fair  Appetite:  Poor  Current Medications: Current Facility-Administered Medications  Medication Dose Route Frequency Provider Last Rate Last Admin   acetaminophen (TYLENOL) tablet 650 mg  650 mg Oral Q6H PRN Jaclyn Shaggyaylor, Cody W, PA-C       alum & mag hydroxide-simeth (MAALOX/MYLANTA) 200-200-20 MG/5ML suspension 30 mL  30 mL Oral Q4H PRN Ladona Ridgelaylor, Cody W, PA-C       citalopram (CELEXA) tablet 10 mg  10 mg Oral Daily Antonieta Pertlary, Greg Lawson, MD   10 mg at 04/01/21 0913   feeding supplement (BOOST / RESOURCE BREEZE) liquid 1 Container  1 Container Oral Q24H Antonieta Pertlary, Greg Lawson, MD   1 Container at 04/01/21 0913   feeding supplement (ENSURE ENLIVE / ENSURE PLUS) liquid 237 mL  237 mL Oral Q24H Antonieta Pertlary, Greg Lawson, MD   237 mL at 03/31/21 2104   hydrOXYzine (ATARAX/VISTARIL) tablet 25 mg  25 mg Oral TID PRN Jaclyn Shaggyaylor, Cody W, PA-C   25 mg at 04/01/21 0914   magnesium hydroxide (MILK OF MAGNESIA) suspension 30 mL  30 mL Oral Daily PRN Jaclyn Shaggyaylor, Cody W, PA-C       multivitamin with minerals tablet 1 tablet  1 tablet Oral Daily Antonieta Pertlary, Greg Lawson, MD   1 tablet at 04/01/21 0914   traZODone (DESYREL) tablet 25 mg  25 mg Oral QHS PRN Antonieta Pertlary, Greg Lawson, MD   25 mg at 03/31/21 2107    Lab Results:  Results for orders placed or performed during the hospital encounter of 03/30/21 (from the past 48 hour(s))  CBC     Status: None   Collection Time: 03/31/21  6:32 AM  Result Value Ref Range   WBC 4.2 4.0 - 10.5 K/uL   RBC 4.40 3.87 - 5.11 MIL/uL   Hemoglobin 12.6 12.0 - 15.0 g/dL   HCT 40.938.5 81.136.0 - 91.446.0 %   MCV 87.5 80.0 - 100.0 fL   MCH 28.6 26.0 - 34.0 pg   MCHC 32.7 30.0 - 36.0 g/dL   RDW 78.214.4 95.611.5 - 21.315.5 %   Platelets 200 150 - 400 K/uL   nRBC 0.0 0.0 - 0.2 %    Comment: Performed at [facility], [address] [place], [place] [address]  Comprehensive metabolic panel     Status: None   Collection Time: 03/31/21  6:32 AM  Result Value Ref Range   Sodium 140 135 - 145 mmol/L   Potassium 3.8 3.5 - 5.1 mmol/L   Chloride 104 98 - 111 mmol/L   CO2 26 22 - 32 mmol/L   Glucose, Bld 99 70 - 99 mg/dL    Comment: Glucose reference range applies only to samples taken after fasting for at least 8  hours.   BUN 10 6 - 20 mg/dL   Creatinine, Ser 6.62 0.44 - 1.00 mg/dL   Calcium 9.4 8.9 - 94.7 mg/dL   Total Protein 7.4 6.5 - 8.1 g/dL   Albumin 4.5 3.5 - 5.0 g/dL   AST 18 15 - 41 U/L   ALT 13 0 - 44 U/L   Alkaline Phosphatase 52 38 - 126 U/L   Total Bilirubin 0.7 0.3 - 1.2 mg/dL   GFR, Estimated >65 >46 mL/min    Comment: (NOTE) Calculated using the CKD-EPI Creatinine Equation (2021)    Anion gap 10 5 - 15    Comment: Performed at [facility], [address], [place], [place] [address]  Hemoglobin A1c     Status: None   Collection Time: 03/31/21  6:32 AM  Result Value Ref Range   Hgb A1c MFr Bld 5.5 4.8 - 5.6 %    Comment: (NOTE) Pre diabetes:          5.7%-6.4%  Diabetes:              >6.4%  Glycemic control for   <7.0% adults with diabetes    Mean Plasma Glucose 111.15 mg/dL    Comment: Performed at [facility], [address] [place], [place] [address]  Lipid panel     Status: None   Collection Time: 03/31/21  6:32 AM  Result Value Ref Range   Cholesterol 163 0 - 200 mg/dL   Triglycerides 40 <275 mg/dL   HDL 57 >17 mg/dL   Total CHOL/HDL Ratio 2.9 RATIO   VLDL 8 0 - 40 mg/dL   LDL Cholesterol 98 0 - 99 mg/dL    Comment:        Total Cholesterol/HDL:CHD Risk Coronary Heart Disease Risk Table                     Men   Women  1/2 Average Risk   3.4   3.3  Average Risk       5.0   4.4  2 X Average Risk   9.6   7.1  3 X Average Risk  23.4   11.0        Use the calculated Patient Ratio above and the CHD Risk Table to determine the patient's CHD  Risk.        ATP III CLASSIFICATION (LDL):  <100     mg/dL   Optimal  001-749  mg/dL   Near or Above                    Optimal  130-159  mg/dL   Borderline  449-675  mg/dL   High  >916     mg/dL   Very High Performed at [facility], [address], [place], [place] [address]   TSH     Status: None   Collection Time: 03/31/21  6:32 AM  Result Value Ref Range   TSH 1.549 0.350 - 4.500 uIU/mL    Comment: Performed by a 3rd Generation assay with a functional sensitivity of <=0.01 uIU/mL. Performed at [facility], [address], [place], [place] [address]     Blood Alcohol level:  No results found for: [facility]  Metabolic Disorder Labs: Lab Results  Component Value Date   HGBA1C 5.5 03/31/2021   MPG 111.15 03/31/2021   No results found for: PROLACTIN Lab Results  Component Value Date   CHOL 163 03/31/2021  TRIG 40 03/31/2021   HDL 57 03/31/2021   CHOLHDL 2.9 03/31/2021   VLDL 8 03/31/2021   LDLCALC 98 03/31/2021    Physical Findings:  Musculoskeletal: Strength & Muscle Tone: within normal limits Gait & Station: normal Patient leans: N/A  Psychiatric Specialty Exam:  Presentation  General Appearance: Appropriate for Environment; Casual  Eye Contact:Good  Speech:Clear and Coherent; Normal Rate  Speech Volume:Decreased  Handedness: Right  Mood and Affect  Mood:Depressed more hopeful today, remorseful for what happened  Affect:Tearful; Depressed tearful when she speaks about her deceased father   Thought Process  Thought Processes:Coherent; Goal Directed; Linear spontaneous   Descriptions of Associations:Intact  Orientation:Full (Time, Place and Person)  Thought Content:Logical Patient denied SI/HI/AVH, delusions, paranoia, first rank symptoms. Patient is not grossly responding to internal/external stimuli on exam and did not make delusional statements.  History of Schizophrenia/Schizoaffective  disorder:No  Duration of Psychotic Symptoms:No data recorded Hallucinations:Hallucinations: None  Ideas of Reference:None  Suicidal Thoughts:Suicidal Thoughts: No  Homicidal Thoughts:Homicidal Thoughts: No   Sensorium  Memory:Immediate Good; Recent Good; Remote Good  Judgment:Fair  Insight:Shallow   Executive Functions  Concentration:Good  Attention Span:Good  Recall:Good  Fund of Knowledge:Good  Language:Good   Psychomotor Activity  Psychomotor Activity:Psychomotor Activity: Normal   Assets  Assets:Communication Skills; Housing; Desire for Improvement; Social Support; Resilience; Physical Health   Sleep  Sleep:Sleep: Fair Number of Hours of Sleep: 5 Patient works night shift, said that she is usually awake in the morning.  Physical Exam: Physical Exam Vitals and nursing note reviewed.  HENT:     Head: Normocephalic.  Pulmonary:     Effort: Pulmonary effort is normal.  Neurological:     Mental Status: She is alert and oriented to person, place, and time.   Review of Systems  Respiratory:  Negative for shortness of breath.   Cardiovascular:  Negative for chest pain.  Gastrointestinal:  Negative for abdominal pain, constipation, diarrhea, nausea and vomiting.  Genitourinary:  Negative for dysuria.  Neurological:  Negative for headaches.  Blood pressure 119/81, pulse 72, temperature 98.9 F (37.2 C), temperature source Oral, resp. rate 18, height  (1.676 m), weight 78.5 kg, SpO2 100 %. Body mass index is 27.92 kg/m.   Treatment Plan Summary: Daily contact with patient to assess and evaluate symptoms and progress in treatment and Medication management  Jeanne Chaney is a 30 y.o. female, with reported past psychiatric history of depression. Patient presents INVOLUNTARILY to [facility] (03/30/2021), transferred from [facility] for worsening depression since death of father 7 months ago and SA by trying to jump out of a 33F window.  [facility]  stay day 2.     ASSESSMENT: Diagnosis:  - Severe episode of recurrent MDD, without psychotic features  TODAY (04/01/2021): Patient was seen talking on the phone in the hallway.  Patient mood is still depressed and tearful when she talks about her deceased father.  However today she seems brighter, and more open to talk about her history.   PSYCHIATRIC DIAGNOSIS & TREATMENT:  Severe episode of recurrent MDD, without psychotic features Increase Celexa to 10 mg p.o. daily to 20 mg p.o. daily  CBC WNL (8/11)  CMP WNL (8/11)  TSH 1.549 (8/11)   PRN's -Trazadone  PO qHS PRN for insomnia -Hydroxyzine  PO TID PRN for anxiety -Alum & mag hydroxide-simeth 30ml PO qHS PRN for GERD -Magnesium hydroxide 30ml PO daily PRN for constipation  Discharge Planning: -Social work and case management to assist with discharge  planning and identification of hospital follow-up needs prior to discharge -Estimated LOS: 3-4 days -Discharge Concerns: Need to establish a safety plan; Medication compliance and effectiveness -Discharge Goals: Return home with outpatient referrals for mental health follow-up including medication management/psychotherapy  Safety and Monitoring: -Involuntary admission to inpatient psychiatric unit for safety, stabilization and treatment -Daily contact with patient to assess and evaluate symptoms and progress in treatment -Patient's case to be discussed in multi-disciplinary team meeting -Observation Level : q15 minute checks -Vital signs: q12 hours -Precautions: suicide, elopement, and assault   Princess Bruins, DO, PGY-1 04/01/2021, 10:28 AM

## 2021-03-31 NOTE — Plan of Care (Signed)
Nurse discussed anxiety, depression and coing skills with patient.

## 2021-03-31 NOTE — Progress Notes (Signed)
Jeanne Chaney is a 30 year old female being admitted involuntarily to [facility] from [facility].  She presented accompanied by [employer] for attempting to jump out of her 2nd story window.  This was witnessed by her boyfriend and 2 children.  She reported major stressor is losing her father to cancer 7 months ago today.  She has no psychiatric history.  During [facility] admission, she was pleasant and cooperative.  She was tearful during much of the assessment.  She denied current SI and stated she realized that she needs to live for her children.  She voiced that she realized this when she spoke with her daughter who was crying due to witnessing the events of the day.  She discussed losing her father 7 months ago today (10th) and she has difficulty the 10th of each month.  She denied HI or AVH.  She denied any pain or discomfort and appeared to be in no physical distress.  Admission paperwork completed and signed.  Belongings searched and secured in locker # 40.  No contraband found.  Skin assessment completed and noted tattoo left upper chest.  Suicide safety plan reviewed, given to patient to complete and return to her nurse.  Q 15 minute checks initiated for safety.  We will monitor the progress towards her goals.

## 2021-03-31 NOTE — Tx Team (Signed)
Interdisciplinary Treatment and Diagnostic Plan Update  03/31/2021 Time of Session: 9a Jeanne Chaney MRN: [id number]  Principal Diagnosis: <principal problem not specified>  Secondary Diagnoses: Active Problems:   Severe episode of recurrent major depressive disorder, without psychotic features ([name])   Current Medications:  Current Facility-Administered Medications  Medication Dose Route Frequency Provider Last Rate Last Admin   acetaminophen (TYLENOL) tablet 650 mg  650 mg Oral Q6H PRN Prescilla Sours, PA-C       alum & mag hydroxide-simeth (MAALOX/MYLANTA) 200-200-20 MG/5ML suspension 30 mL  30 mL Oral Q4H PRN Lovena Le, Cody W, PA-C       citalopram (CELEXA) tablet 10 mg  10 mg Oral Daily Sharma Covert, MD       feeding supplement (BOOST / RESOURCE BREEZE) liquid 1 Container  1 Container Oral Q24H Sharma Covert, MD       feeding supplement (ENSURE ENLIVE / ENSURE PLUS) liquid 237 mL  237 mL Oral Q24H Sharma Covert, MD       hydrOXYzine (ATARAX/VISTARIL) tablet 25 mg  25 mg Oral TID PRN Prescilla Sours, PA-C   25 mg at 03/31/21 0755   magnesium hydroxide (MILK OF MAGNESIA) suspension 30 mL  30 mL Oral Daily PRN Prescilla Sours, PA-C       multivitamin with minerals tablet 1 tablet  1 tablet Oral Daily Sharma Covert, MD       traZODone (DESYREL) tablet 25 mg  25 mg Oral QHS PRN Sharma Covert, MD       PTA Medications: Medications Prior to Admission  Medication Sig Dispense Refill Last Dose   Etonogestrel (IMPLANON [place]) Inject 1 application into the skin once.       Patient Stressors: Loss of Father Occupational concerns  Patient Strengths: Capable of independent living Occupational psychologist fund of knowledge Motivation for treatment/growth Physical Health Supportive family/friends  Treatment Modalities: Medication Management, Group therapy, Case management,  1 to 1 session with clinician, Psychoeducation, Recreational  therapy.   Physician Treatment Plan for Primary Diagnosis: <principal problem not specified> Long Term Goal(s): Improvement in symptoms so as ready for discharge   Short Term Goals: Ability to identify changes in lifestyle to reduce recurrence of condition will improve Ability to verbalize feelings will improve Ability to disclose and discuss suicidal ideas Ability to demonstrate self-control will improve Ability to identify and develop effective coping behaviors will improve Ability to maintain clinical measurements within normal limits will improve Ability to identify triggers associated with substance abuse/mental health issues will improve  Medication Management: Evaluate patient's response, side effects, and tolerance of medication regimen.  Therapeutic Interventions: 1 to 1 sessions, Unit Group sessions and Medication administration.  Evaluation of Outcomes: Progressing  Physician Treatment Plan for Secondary Diagnosis: Active Problems:   Severe episode of recurrent major depressive disorder, without psychotic features ([name])  Long Term Goal(s): Improvement in symptoms so as ready for discharge   Short Term Goals: Ability to identify changes in lifestyle to reduce recurrence of condition will improve Ability to verbalize feelings will improve Ability to disclose and discuss suicidal ideas Ability to demonstrate self-control will improve Ability to identify and develop effective coping behaviors will improve Ability to maintain clinical measurements within normal limits will improve Ability to identify triggers associated with substance abuse/mental health issues will improve     Medication Management: Evaluate patient's response, side effects, and tolerance of medication regimen.  Therapeutic Interventions: 1 to 1 sessions, Unit Group sessions  and Medication administration.  Evaluation of Outcomes: Progressing   RN Treatment Plan for Primary Diagnosis: <principal problem  not specified> Long Term Goal(s): Knowledge of disease and therapeutic regimen to maintain health will improve  Short Term Goals: Ability to remain free from injury will improve, Ability to verbalize feelings will improve, and Ability to disclose and discuss suicidal ideas  Medication Management: RN will administer medications as ordered by provider, will assess and evaluate patient's response and provide education to patient for prescribed medication. RN will report any adverse and/or side effects to prescribing provider.  Therapeutic Interventions: 1 on 1 counseling sessions, Psychoeducation, Medication administration, Evaluate responses to treatment, Monitor vital signs and CBGs as ordered, Perform/monitor CIWA, COWS, AIMS and Fall Risk screenings as ordered, Perform wound care treatments as ordered.  Evaluation of Outcomes: Not Met   LCSW Treatment Plan for Primary Diagnosis: <principal problem not specified> Long Term Goal(s): Safe transition to appropriate next level of care at discharge, Engage patient in therapeutic group addressing interpersonal concerns.  Short Term Goals: Engage patient in aftercare planning with referrals and resources, Increase social support, and Increase ability to appropriately verbalize feelings  Therapeutic Interventions: Assess for all discharge needs, 1 to 1 time with Social worker, Explore available resources and support systems, Assess for adequacy in community support network, Educate family and significant other(s) on suicide prevention, Complete Psychosocial Assessment, Interpersonal group therapy.  Evaluation of Outcomes: Not Met   Progress in Treatment: Attending groups: No. Participating in groups: No. Taking medication as prescribed: Yes. Toleration medication: Yes. Family/Significant other contact made: No, will contact:  pt's step mother Patient understands diagnosis: No. Discussing patient identified problems/goals with staff:  Yes. Medical problems stabilized or resolved: Yes. Denies suicidal/homicidal ideation: Yes. Issues/concerns per patient self-inventory: No. Other: None  New problem(s) identified: No, Describe:  None  New Short Term/Long Term Goal(s):medication stabilization, elimination of SI thoughts, development of comprehensive mental wellness plan.   Patient Goals:  "stay positive"  Discharge Plan or Barriers: Patient recently admitted. CSW will continue to follow and assess for appropriate referrals and possible discharge planning.   Reason for Continuation of Hospitalization: Depression Medication stabilization Suicidal ideation  Estimated Length of Stay: 3-5 days  Attendees: Patient: Rogelio Winbush 04/01/21  Physician: Dr. Kai Levins 04/01/21  Nursing:  04/01/21  RN Care Manager: 04/01/21  Social Worker: Toney Reil, [place] 04/01/21  Recreational Therapist:  04/01/21  Other:  04/01/21  Other:  04/01/21  Other: 04/01/21    Scribe for Treatment Team: Mliss Fritz, Latanya Presser 03/31/2021 2:21 PM

## 2021-03-31 NOTE — BHH Suicide Risk Assessment (Signed)
[facility] Admission Suicide Risk Assessment   Nursing information obtained from:  Patient Demographic factors:  NA Current Mental Status:  NA Loss Factors:  Loss of significant relationship Historical Factors:  Impulsivity Risk Reduction Factors:  Sense of responsibility to family, Responsible for children under 29 years of age  Total Time spent with patient: 30 minutes Principal Problem: <principal problem not specified> Diagnosis:  Active Problems:   Severe episode of recurrent major depressive disorder, without psychotic features (HCC)  Subjective Data: Patient is seen and examined.  Patient is a 30 year old female with a probable history of major depression who presented to the [facility] on 03/30/2021 with worsening depression and suicidal ideation.  The patient stated that she has been having a difficult time for an unspecified amount of time.  She stated that most recently she had gotten more depressed after the death of her father 7 months ago.  She stated her father had passed away from cancer.  He was very young in his 50s.  He and his current wife lived in [place].  The patient stated that she always felt as though her father was supportive.  She stated that her local family here is not supportive.  She stated that she was having difficulty with her family members, and then yesterday things became even worse when she recognized that it was 7 months to the day of the death of her father.  She admitted to crying spells, helplessness, hopelessness and worthlessness.  She was also having suicidal thoughts, but did not act on her actions.  She stated that she had attempted to harm her self twice in the past.  1 when she was very young at age 64, and another time at approximately age 2.  When she was very young she did get counseling, but at age 13 she did not.  She has not been admitted to the hospital psychiatrically, nor taken psychiatric medications in the past.  She  was admitted to the hospital for evaluation and stabilization.  She denied any history of physical, emotional or sexual trauma.  No psychotic symptoms.  She was tearful throughout the interview, but did request to be able to be discharged.  We discussed that.  She has 2 young children.  The children are in the custody of their biological father.  The biological father lives in [place].  She stated that the biological father is supportive of the children, but not of her.  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  She did admit to marijuana use, but no other drugs or alcohol.  Continued Clinical Symptoms:  Alcohol Use Disorder Identification Test Final Score (AUDIT): 0 The "Alcohol Use Disorders Identification Test", Guidelines for Use in Primary Care, Second Edition.  World Science writer [facility]). Score between 0-7:  no or low risk or alcohol related problems. Score between 8-15:  moderate risk of alcohol related problems. Score between 16-19:  high risk of alcohol related problems. Score 20 or above:  warrants further diagnostic evaluation for alcohol dependence and treatment.   CLINICAL FACTORS:   Depression:   Anhedonia Hopelessness Impulsivity Insomnia   Musculoskeletal: Strength & Muscle Tone: within normal limits Gait & Station: normal Patient leans: N/A  Psychiatric Specialty Exam:  Presentation  General Appearance: Appropriate for Environment  Eye Contact:Fair  Speech:Clear and Coherent  Speech Volume:Increased  Handedness: No data recorded  Mood and Affect  Mood:Angry; Hopeless  Affect:Congruent   Thought Process  Thought  Processes:Coherent  Descriptions of Associations:Intact  Orientation:Full (Time, Place and Person)  Thought Content:Illogical  History of Schizophrenia/Schizoaffective disorder:No  Duration of Psychotic Symptoms:No data recorded Hallucinations:Hallucinations: None  Ideas of  Reference:None  Suicidal Thoughts:Suicidal Thoughts: Yes, Passive  Homicidal Thoughts:Homicidal Thoughts: No   Sensorium  Memory:Immediate Good; Recent Good; Remote Good  Judgment:Impaired  Insight:Shallow   Executive Functions  Concentration:Fair  Attention Span:Fair  Recall:Fair  Fund of Knowledge:Good  Language:Good   Psychomotor Activity  Psychomotor Activity:Psychomotor Activity: Normal   Assets  Assets:Communication Skills; Housing; Resilience   Sleep  Sleep:Sleep: Poor    Physical Exam: Physical Exam Vitals and nursing note reviewed.  HENT:     Head: Normocephalic and atraumatic.  Pulmonary:     Effort: Pulmonary effort is normal.  Neurological:     General: No focal deficit present.     Mental Status: She is alert and oriented to person, place, and time.   ROS Blood pressure 132/85, pulse 60, temperature 98.5 F (36.9 C), temperature source Oral, resp. rate 18, height 5\' 6"  (1.676 m), weight 78.5 kg, SpO2 100 %. Body mass index is 27.92 kg/m.   COGNITIVE FEATURES THAT CONTRIBUTE TO RISK:  None    SUICIDE RISK:   Minimal: No identifiable suicidal ideation.  Patients presenting with no risk factors but with morbid ruminations; may be classified as minimal risk based on the severity of the depressive symptoms  PLAN OF CARE: Patient is seen and examined.  Patient is a 30 year old female with the above-stated past psychiatric history who was admitted with worsening depression and suicidal ideation.  She will be admitted to the hospital.  She will be integrated in the milieu.  She will be encouraged to attend groups.  We will start her on Celexa 10 mg p.o. daily.  We discussed the side effects of that.  She already has written hydroxyzine 25 mg p.o. 3 times daily as needed anxiety, and has had trazodone written for 50 mg, but because of her slight size as well as no other medications similar to this in the past I reduce that dosage to 25 mg p.o.  nightly as needed.  She denied any previous medical problems.  Review of her admission laboratories revealed essentially normal electrolytes including a creatinine of 0.64 and normal liver function enzymes.  Her lipid panel was normal.  CBC was normal.  Hemoglobin A1c was 5.5.  Pregnancy test was negative.  TSH was normal at 1.549.  Respiratory panel for influenza A, B and coronavirus were negative.  Urinalysis was essentially negative except for marijuana.  Her vital signs are stable, she is afebrile.  Pulse oximetry on room air was 100%.  I certify that inpatient services furnished can reasonably be expected to improve the patient's condition.   26, MD 03/31/2021, 10:37 AM

## 2021-03-31 NOTE — Progress Notes (Signed)
CSW made a CPS report.    Ciarah Peace, LCSW, LCAS Clincal Social Worker  [facility]

## 2021-03-31 NOTE — H&P (Signed)
Psychiatric Admission Assessment Adult  Patient Identification: Jeanne Chaney MRN:  [id number] Date of Evaluation:  03/31/2021 Chief Complaint:  Severe episode of recurrent major depressive disorder, without psychotic features (HCC) [F33.2] Principal Diagnosis: <principal problem not specified> Diagnosis:  Active Problems:   Severe episode of recurrent major depressive disorder, without psychotic features (HCC)  History of Present Illness: Patient is seen and examined.  Patient is a 30 year old female with a probable history of major depression who presented to the [facility] on 03/30/2021 with worsening depression and suicidal ideation.  The patient stated that she has been having a difficult time for an unspecified amount of time.  She stated that most recently she had gotten more depressed after the death of her father 7 months ago.  She stated her father had passed away from cancer.  He was very young in his 13s.  He and his current wife lived in [place].  The patient stated that she always felt as though her father was supportive.  She stated that her local family here is not supportive.  She stated that she was having difficulty with her family members, and then yesterday things became even worse when she recognized that it was 7 months to the day of the death of her father.  She admitted to crying spells, helplessness, hopelessness and worthlessness.  She was also having suicidal thoughts, but did not act on her actions.  She stated that she had attempted to harm her self twice in the past.  1 when she was very young at age 72, and another time at approximately age 89.  When she was very young she did get counseling, but at age 36 she did not.  She has not been admitted to the hospital psychiatrically, nor taken psychiatric medications in the past.  She was admitted to the hospital for evaluation and stabilization.  She denied any history of physical, emotional or sexual  trauma.  No psychotic symptoms.  She was tearful throughout the interview, but did request to be able to be discharged.  We discussed that.  She has 2 young children.  The children are in the custody of their biological father.  The biological father lives in [place].  She stated that the biological father is supportive of the children, but not of her.  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  She did admit to marijuana use, but no other drugs or alcohol.  Associated Signs/Symptoms: Depression Symptoms:  depressed mood, anhedonia, insomnia, psychomotor retardation, fatigue, feelings of worthlessness/guilt, hopelessness, suicidal thoughts with specific plan, anxiety, loss of energy/fatigue, Duration of Depression Symptoms: Greater than two weeks  (Hypo) Manic Symptoms:   Denied Anxiety Symptoms:  Excessive Worry, Psychotic Symptoms:   Denied PTSD Symptoms: Negative Total Time spent with patient: 30 minutes  Past Psychiatric History: Patient stated that she had attempted to harm her self at a very young age and was placed in the counseling.  No medications or hospitalizations.  She also had thoughts of harming herself at approximately age 64.  She did not seek either psychiatric evaluations or therapy at that time.  No previous psychiatric medications.  Is the patient at risk to self? Yes.    Has the patient been a risk to self in the past 6 months? Yes.    Has the patient been a risk to self within the distant past? Yes.    Is the patient a risk to others? No.  Has the patient been a risk to others in the past 6 months? No.  Has the patient been a risk to others within the distant past? No.   Prior Inpatient Therapy:   Prior Outpatient Therapy:    Alcohol Screening: 1. How often do you have a drink containing alcohol?: Never 2. How many drinks containing alcohol do you have on a typical day when you are drinking?: 1 or 2 3. How often do  you have six or more drinks on one occasion?: Never AUDIT-C Score: 0 9. Have you or someone else been injured as a result of your drinking?: No 10. Has a relative or friend or a doctor or another health worker been concerned about your drinking or suggested you cut down?: No Alcohol Use Disorder Identification Test Final Score (AUDIT): 0 Substance Abuse History in the last 12 months:  Yes.   Consequences of Substance Abuse: Negative Previous Psychotropic Medications: No  Psychological Evaluations: Yes  Past Medical History:  Past Medical History:  Diagnosis Date   Herpes genitalia    Migraines    Trichimoniasis    Yeast infection    History reviewed. No pertinent surgical history. Family History:  Family History  Problem Relation Age of Onset   Asthma Mother    Family Psychiatric  History: Denied Tobacco Screening:   Social History:  Social History   Substance and Sexual Activity  Alcohol Use No     Social History   Substance and Sexual Activity  Drug Use Yes   Types: Marijuana    Additional Social History: Marital status: Single Are you sexually active?: Yes What is your sexual orientation?: heterosexual Does patient have children?: Yes How many children?: 2 How is patient's relationship with their children?: Pt reports good relationship with 2 kids, ages 74 and 15.                         Allergies:  No Known Allergies Lab Results:  Results for orders placed or performed during the hospital encounter of 03/30/21 (from the past 48 hour(s))  CBC     Status: None   Collection Time: 03/31/21  6:32 AM  Result Value Ref Range   WBC 4.2 4.0 - 10.5 K/uL   RBC 4.40 3.87 - 5.11 MIL/uL   Hemoglobin 12.6 12.0 - 15.0 g/dL   HCT 63.1 49.7 - 02.6 %   MCV 87.5 80.0 - 100.0 fL   MCH 28.6 26.0 - 34.0 pg   MCHC 32.7 30.0 - 36.0 g/dL   RDW 37.8 58.8 - 50.2 %   Platelets 200 150 - 400 K/uL   nRBC 0.0 0.0 - 0.2 %    Comment: Performed at [facility], [address] [place], [place] [address]  Comprehensive metabolic panel     Status: None   Collection Time: 03/31/21  6:32 AM  Result Value Ref Range   Sodium 140 135 - 145 mmol/L   Potassium 3.8 3.5 - 5.1 mmol/L   Chloride 104 98 - 111 mmol/L   CO2 26 22 - 32 mmol/L   Glucose, Bld 99 70 - 99 mg/dL    Comment: Glucose reference range applies only to samples taken after fasting for at least 8 hours.   BUN 10 6 - 20 mg/dL   Creatinine, Ser 8.78 0.44 - 1.00 mg/dL   Calcium 9.4 8.9 - 67.6 mg/dL   Total Protein 7.4 6.5 - 8.1 g/dL   Albumin 4.5  3.5 - 5.0 g/dL   AST 18 15 - 41 U/L   ALT 13 0 - 44 U/L   Alkaline Phosphatase 52 38 - 126 U/L   Total Bilirubin 0.7 0.3 - 1.2 mg/dL   GFR, Estimated >84 >13 mL/min    Comment: (NOTE) Calculated using the CKD-EPI Creatinine Equation (2021)    Anion gap 10 5 - 15    Comment: Performed at [facility], [address] [place], [place] [address]  Hemoglobin A1c     Status: None   Collection Time: 03/31/21  6:32 AM  Result Value Ref Range   Hgb A1c MFr Bld 5.5 4.8 - 5.6 %    Comment: (NOTE) Pre diabetes:          5.7%-6.4%  Diabetes:              >6.4%  Glycemic control for   <7.0% adults with diabetes    Mean Plasma Glucose 111.15 mg/dL    Comment: Performed at [facility], [address], [place], [place] [address]  Lipid panel     Status: None   Collection Time: 03/31/21  6:32 AM  Result Value Ref Range   Cholesterol 163 0 - 200 mg/dL   Triglycerides 40 <366 mg/dL   HDL 57 >44 mg/dL   Total CHOL/HDL Ratio 2.9 RATIO   VLDL 8 0 - 40 mg/dL   LDL Cholesterol 98 0 - 99 mg/dL    Comment:        Total Cholesterol/HDL:CHD Risk Coronary Heart Disease Risk Table                     Men   Women  1/2 Average Risk   3.4   3.3  Average Risk       5.0   4.4  2 X Average Risk   9.6   7.1  3 X Average Risk  23.4   11.0        Use the calculated Patient Ratio above and the CHD Risk Table to determine the  patient's CHD Risk.        ATP III CLASSIFICATION (LDL):  <100     mg/dL   Optimal  034-742  mg/dL   Near or Above                    Optimal  130-159  mg/dL   Borderline  595-638  mg/dL   High  >756     mg/dL   Very High Performed at [facility], [address], [place], [place] [address]   TSH     Status: None   Collection Time: 03/31/21  6:32 AM  Result Value Ref Range   TSH 1.549 0.350 - 4.500 uIU/mL    Comment: Performed by a 3rd Generation assay with a functional sensitivity of <=0.01 uIU/mL. Performed at [facility], [address], [place], [place] [address]     Blood Alcohol level:  No results found for: [facility]  Metabolic Disorder Labs:  Lab Results  Component Value Date   HGBA1C 5.5 03/31/2021   MPG 111.15 03/31/2021   No results found for: PROLACTIN Lab Results  Component Value Date   CHOL 163 03/31/2021   TRIG 40 03/31/2021   HDL 57 03/31/2021   CHOLHDL 2.9 03/31/2021   VLDL 8 03/31/2021   LDLCALC 98 03/31/2021    Current Medications: Current Facility-Administered Medications  Medication Dose Route Frequency  Provider Last Rate Last Admin   acetaminophen (TYLENOL) tablet 650 mg  650 mg Oral Q6H PRN Jaclyn Shaggyaylor, Cody W, PA-C       alum & mag hydroxide-simeth (MAALOX/MYLANTA) 200-200-20 MG/5ML suspension 30 mL  30 mL Oral Q4H PRN Melbourne Abtsaylor, Cody W, PA-C       citalopram (CELEXA) tablet 10 mg  10 mg Oral Daily Antonieta Pertlary, Nobuko Gsell Lawson, MD       feeding supplement (BOOST / RESOURCE BREEZE) liquid 1 Container  1 Container Oral Q24H Antonieta Pertlary, Tanequa Kretz Lawson, MD       feeding supplement (ENSURE ENLIVE / ENSURE PLUS) liquid 237 mL  237 mL Oral Q24H Antonieta Pertlary, Marselino Slayton Lawson, MD       hydrOXYzine (ATARAX/VISTARIL) tablet 25 mg  25 mg Oral TID PRN Jaclyn Shaggyaylor, Cody W, PA-C   25 mg at 03/31/21 0755   magnesium hydroxide (MILK OF MAGNESIA) suspension 30 mL  30 mL Oral Daily PRN Jaclyn Shaggyaylor, Cody W, PA-C       multivitamin with minerals tablet 1 tablet  1 tablet Oral  Daily Antonieta Pertlary, Hoyte Ziebell Lawson, MD       traZODone (DESYREL) tablet 25 mg  25 mg Oral QHS PRN Antonieta Pertlary, Nirvi Boehler Lawson, MD       PTA Medications: Medications Prior to Admission  Medication Sig Dispense Refill Last Dose   Etonogestrel (IMPLANON [place]) Inject 1 application into the skin once.       Musculoskeletal: Strength & Muscle Tone: within normal limits Gait & Station: normal Patient leans: N/A            Psychiatric Specialty Exam:  Presentation  General Appearance: Appropriate for Environment  Eye Contact:Fair  Speech:Clear and Coherent  Speech Volume:Increased  Handedness: No data recorded  Mood and Affect  Mood:Angry; Hopeless  Affect:Congruent   Thought Process  Thought Processes:Coherent  Duration of Psychotic Symptoms: No data recorded Past Diagnosis of Schizophrenia or Psychoactive disorder: No  Descriptions of Associations:Intact  Orientation:Full (Time, Place and Person)  Thought Content:Illogical  Hallucinations:Hallucinations: None  Ideas of Reference:None  Suicidal Thoughts:Suicidal Thoughts: Yes, Passive  Homicidal Thoughts:Homicidal Thoughts: No   Sensorium  Memory:Immediate Good; Recent Good; Remote Good  Judgment:Impaired  Insight:Shallow   Executive Functions  Concentration:Fair  Attention Span:Fair  Recall:Fair  Fund of Knowledge:Good  Language:Good   Psychomotor Activity  Psychomotor Activity:Psychomotor Activity: Normal   Assets  Assets:Communication Skills; Housing; Resilience   Sleep  Sleep:Sleep: Poor    Physical Exam: Physical Exam Vitals and nursing note reviewed.  Constitutional:      Appearance: Normal appearance.  HENT:     Head: Normocephalic and atraumatic.  Pulmonary:     Effort: Pulmonary effort is normal.  Neurological:     General: No focal deficit present.     Mental Status: She is alert and oriented to person, place, and time.   Review of Systems  All other systems reviewed and  are negative. Blood pressure 132/85, pulse 60, temperature 98.5 F (36.9 C), temperature source Oral, resp. rate 18, height 5\' 6"  (1.676 m), weight 78.5 kg, SpO2 100 %. Body mass index is 27.92 kg/m.  Treatment Plan Summary: Patient is seen and examined.  Patient is a 30 year old female with the above-stated past psychiatric history who was admitted with worsening depression and suicidal ideation.  She will be admitted to the hospital.  She will be integrated in the milieu.  She will be encouraged to attend groups.  We will start her on Celexa 10 mg p.o. daily.  We discussed the  side effects of that.  She already has written hydroxyzine 25 mg p.o. 3 times daily as needed anxiety, and has had trazodone written for 50 mg, but because of her slight size as well as no other medications similar to this in the past I reduce that dosage to 25 mg p.o. nightly as needed.  She denied any previous medical problems.  Review of her admission laboratories revealed essentially normal electrolytes including a creatinine of 0.64 and normal liver function enzymes.  Her lipid panel was normal.  CBC was normal.  Hemoglobin A1c was 5.5.  Pregnancy test was negative.  TSH was normal at 1.549.  Respiratory panel for influenza A, B and coronavirus were negative.  Urinalysis was essentially negative except for marijuana.  Her vital signs are stable, she is afebrile.  Pulse oximetry on room air was 100%.  Observation Level/Precautions:  15 minute checks  Laboratory:  CBC Chemistry Profile HbAIC HCG UDS UA  Psychotherapy:    Medications:    Consultations:    Discharge Concerns:    Estimated LOS:  Other:     Physician Treatment Plan for Primary Diagnosis: <principal problem not specified> Long Term Goal(s): Improvement in symptoms so as ready for discharge  Short Term Goals: Ability to identify changes in lifestyle to reduce recurrence of condition will improve, Ability to verbalize feelings will improve, Ability to  disclose and discuss suicidal ideas, Ability to demonstrate self-control will improve, Ability to identify and develop effective coping behaviors will improve, Ability to maintain clinical measurements within normal limits will improve, and Ability to identify triggers associated with substance abuse/mental health issues will improve  Physician Treatment Plan for Secondary Diagnosis: Active Problems:   Severe episode of recurrent major depressive disorder, without psychotic features (HCC)  Long Term Goal(s): Improvement in symptoms so as ready for discharge  Short Term Goals: Ability to identify changes in lifestyle to reduce recurrence of condition will improve, Ability to verbalize feelings will improve, Ability to disclose and discuss suicidal ideas, Ability to demonstrate self-control will improve, Ability to identify and develop effective coping behaviors will improve, Ability to maintain clinical measurements within normal limits will improve, and Ability to identify triggers associated with substance abuse/mental health issues will improve  I certify that inpatient services furnished can reasonably be expected to improve the patient's condition.    Antonieta Pert, MD 8/11/202212:47 PM

## 2021-03-31 NOTE — Progress Notes (Addendum)
D:  Patient denied SI and HI, contract for safety.  Denied A/V hallucinations.  Denied pain. A:  Medications administered per MD orders.  Emotional support and encouragement given R:  Safety maintained with 15 minute checks.

## 2021-03-31 NOTE — Tx Team (Signed)
Initial Treatment Plan 03/31/2021 12:19 AM Jeanne Chaney MGN:[id number]    PATIENT STRESSORS: Loss of Father Occupational concerns   PATIENT STRENGTHS: Capable of independent living Metallurgist fund of knowledge Motivation for treatment/growth Physical Health Supportive family/friends   PATIENT IDENTIFIED PROBLEMS: Depression  Suicidal ideation      "Get help with my depression"  "Get help with my anger"           DISCHARGE CRITERIA:  Improved stabilization in mood, thinking, and/or behavior Need for constant or close observation no longer present Reduction of life-threatening or endangering symptoms to within safe limits Verbal commitment to aftercare and medication compliance  PRELIMINARY DISCHARGE PLAN: Outpatient therapy Medication management  PATIENT/FAMILY INVOLVEMENT: This treatment plan has been presented to and reviewed with the patient, Jeanne Chaney.  The patient and family have been given the opportunity to ask questions and make suggestions.  Levin Bacon, RN 03/31/2021, 12:19 AM

## 2021-03-31 NOTE — [facility] Counselor (Signed)
Adult Comprehensive Assessment  Patient ID: Jeanne Chaney, female   DOB: Jun 21, 1991, 30 y.o.   MRN: [id number]  Information Source: Information source: Patient  Current Stressors:  Patient states their primary concerns and needs for treatment are:: "I had a breakdown. I tried to commit suicide by jumping out of the window." Patient states their goals for this hospitilization and ongoing recovery are:: "to be better." Employment / Job issues: None reported Surveyor, quantity / Lack of resources (include bankruptcy): "Yeah" Housing / Lack of housing: "Yeah" Bereavement / Loss: Father passed apx 7 months ago "It has been a lot to deal with."  Living/Environment/Situation:  Living Arrangements: Children Living conditions (as described by patient or guardian): Pt is primar caretaker. Pt reports that children were in the home with their father when she attempted suicide. Who else lives in the home?: 2 children (8 and 78) How long has patient lived in current situation?: 3 years What is atmosphere in current home: Comfortable  Family History:  Marital status: Single Are you sexually active?: Yes What is your sexual orientation?: heterosexual Does patient have children?: Yes How many children?: 2 How is patient's relationship with their children?: Pt reports good relationship with 2 kids, ages 40 and 64.  Childhood History:  By whom was/is the patient raised?: Mother Additional childhood history information: Pt reports that her mother kept her and her siblings from her father. Pt reconnected with her father when she was 89 years old. Pt states she grew up in [place] Description of patient's relationship with caregiver when they were a child: Mother:"It was okay." Patient's description of current relationship with people who raised him/her: Mother:"I don't feel like I have a relationship with her." How were you disciplined when you got in trouble as a child/adolescent?: "Beat" Does patient have  siblings?: Yes Number of Siblings: 12 Description of patient's current relationship with siblings: Pt reports she has "amazing" relationships with her siblings. Did patient suffer any verbal/emotional/physical/sexual abuse as a child?: No Did patient suffer from severe childhood neglect?: No Has patient ever been sexually abused/assaulted/raped as an adolescent or adult?: No Was the patient ever a victim of a crime or a disaster?: No Witnessed domestic violence?: No Has patient been affected by domestic violence as an adult?: No  Education:  Highest grade of school patient has completed: 11th grade Currently a student?: No Learning disability?: No  Employment/Work Situation:   Employment Situation: Employed Where is Patient Currently Employed?: [employer] How Long has Patient Been Employed?: 1 month Are You Satisfied With Your Job?: No Do You Work More Than One Job?: Yes Work Stressors: "No it is just a job I don't want" Patient's Job has Been Impacted by Current Illness: No What is the Longest Time Patient has Held a Job?: 5 years Where was the Patient Employed at that Time?: [employer] Has Patient ever Been in the [employer]?: No  Financial Resources:   Financial resources: Income from employment Does patient have a representative payee or guardian?: No  Alcohol/Substance Abuse:   What has been your use of drugs/alcohol within the last 12 months?: Reports smoking marijuana daily apx 2 blunts If attempted suicide, did drugs/alcohol play a role in this?: No Alcohol/Substance Abuse Treatment Hx: Denies past history Has alcohol/substance abuse ever caused legal problems?: No  Social Support System:   Forensic psychologist System: None  Leisure/Recreation:   Do You Have Hobbies?: Yes Leisure and Hobbies: "Listen to music"  Strengths/Needs:   What is the patient's perception  of their strengths?: "None"  Discharge Plan:   Currently receiving community mental health services:  No Patient states concerns and preferences for aftercare planning are: interested in therapy and medication management, bereavement counseling Patient states they will know when they are safe and ready for discharge when: "I wanna be different, better." Does patient have access to transportation?: Yes Does patient have financial barriers related to discharge medications?: Yes Patient description of barriers related to discharge medications: low income and no insurance Will patient be returning to same living situation after discharge?: Yes (personal home)  Summary/Recommendations:  Jeanne Chaney was admitted after attempting to jump out of a window as a suicide attempt and worsening depression. Recent Stressors include the death of her father apx 7 months ago from cancer. Pt currently sees no outpatient providers. While here, Jeanne Chaney can benefit from crisis stabilization, medication management, therapeutic milieu, and referrals for services.      [name]. 03/31/2021

## 2021-03-31 NOTE — Progress Notes (Signed)
Group not held due to staffing.  

## 2021-03-31 NOTE — Progress Notes (Signed)
NUTRITION ASSESSMENT RD working remotely.   Pt identified as at risk on the Malnutrition Screen Tool  INTERVENTION: - will order Boost Breeze once/day, each supplement provides 250 kcal and 9 grams of protein. - will order Ensure Enlive once/day, each supplement provides 350 kcal and 20 grams of protein. - will order 1 tablet multivitamin with minerals/day.  [facility] staff to continue to encourage PO intakes of meals and snacks.    NUTRITION DIAGNOSIS: Unintentional weight loss related to sub-optimal intake as evidenced by pt report.   Goal: Pt to meet >/= 90% of their estimated nutrition needs.  Monitor:  PO intake  Assessment:  Patient was voluntarily admitted after attempting to jump out of a 2nd story window in her home. She reported depression on 8/10 as it marked the 7 month anniversary of her father dying d/t cancer.  Weight yesterday was 173 lb and weight on 03/02/20 was 204 lb. This indicates 31 lb weight loss (15% body weight) in the past 13 months; not significant for time frame, but unable to determine if weight loss occurred more acutely.    30 y.o. female  Height: Ht Readings from Last 1 Encounters:  03/30/21 5\' 6"  (1.676 m)    Weight: Wt Readings from Last 1 Encounters:  03/30/21 78.5 kg    Weight Hx: Wt Readings from Last 10 Encounters:  03/30/21 78.5 kg  03/02/20 92.8 kg  01/30/20 93.5 kg  11/24/19 99.8 kg  04/14/19 108.9 kg  03/07/19 112.9 kg  11/26/18 112.5 kg  08/05/18 107 kg  07/15/18 107.5 kg  05/28/18 106.8 kg    BMI:  Body mass index is 27.92 kg/m. Pt meets criteria for overweight status based on current BMI.  Estimated Nutritional Needs: Kcal: 25-30 kcal/kg Protein: > 1 gram protein/kg Fluid: 1 ml/kcal  Diet Order:  Diet Order             Diet regular Room service appropriate? Yes; Fluid consistency: Thin  Diet effective now                  Pt is also offered choice of unit snacks mid-morning and mid-afternoon.  Pt is  eating as desired.   Lab results and medications reviewed.     07/28/18, MS, RD, LDN, CNSC Inpatient Clinical Dietitian RD pager # available in [facility]  After hours/weekend pager # available in [facility]

## 2021-04-01 MED ORDER — CITALOPRAM HYDROBROMIDE 20 MG PO TABS
20.0000 mg | ORAL_TABLET | Freq: Every day | ORAL | Status: DC
  Administered 2021-04-02 – 2021-04-03 (×2): 20 mg via ORAL
  Filled 2021-04-01 (×2): qty 1
  Filled 2021-04-01: qty 7
  Filled 2021-04-01: qty 1

## 2021-04-01 NOTE — Progress Notes (Signed)
Pt endorses anxiety and depression, but denied SI/HI/AVH.  Pt is pleasant upon approach and makes her needs known. Pt interacts with pt's and other staff members without issue.  Pt remains on q 15 min room checks to promote safety.  RN will continue to monitor and follow up with pt as needed.    04/01/21 0915  Psych Admission Type (Psych Patients Only)  Admission Status Involuntary  Psychosocial Assessment  Patient Complaints Anxiety  Eye Contact Fair  Facial Expression Sad  Affect Anxious  Speech Logical/coherent  Interaction Assertive  Motor Activity Other (Comment) (unremarkable)  Appearance/Hygiene Unremarkable  Behavior Characteristics Cooperative;Anxious  Mood Anxious;Depressed  Thought Process  Coherency WDL  Content WDL  Delusions None reported or observed  Perception WDL  Hallucination None reported or observed  Judgment Poor  Confusion None  Danger to Self  Current suicidal ideation? Denies  Danger to Others  Danger to Others None reported or observed

## 2021-04-01 NOTE — Progress Notes (Addendum)
Was able to get in direct contact with patient's mother Jeanne Chaney [phone]), who was present during patient's SA.  According to mom, patient became overwhelmed when mom and ex-boyfriend came together to speak to patient about how her mood after the passing of patient's father. Mom stated that the patient seemed like she was overwhelmed by them asking "what is wrong", which prompted patient to attempted to jump from 8F window to "get away". Mom confirmed that the SA was impulsive. Stated that after the event, the patient calmed down.  Mom reported that patient has not had time to grieve the death properly and has continued to work and take care of her children. Mom stated that she is sorry for not being able to understand patient's need and thought that talking to someone other than family would be better for the patient.   Mom reported that the patient is typically calm, bright affect, and cheery.  Mom confirmed that patient and ex-boyfriend share joint custody of children and that the children are currently at home with him.

## 2021-04-01 NOTE — [facility] Group Notes (Signed)
Type of Therapy and Topic: Group Therapy: Overcoming Obstacles  Participation Level: Active  Description of Group: In this group patients will be encouraged to explore what they see as obstacles to their own wellness and recovery. They will be guided to discuss their thoughts, feelings, and behaviors related to these obstacles. The group will process together ways to cope with barriers, with attention given to specific choices patients can make. Each patient will be challenged to identify changes they are motivated to make in order to overcome their obstacles. This group will be process-oriented, with patients participating in exploration of their own experiences as well as giving and receiving support and challenge from other group members.  Therapeutic Goals: 1. Patient will identify personal and current obstacles as they relate to admission. 2. Patient will identify barriers that currently interfere with their wellness or overcoming obstacles. 3. Patient will identify feelings, thought process and behaviors related to these barriers. 4. Patient will identify two changes they are willing to make to overcome these obstacles:  Summary of Patient Progress: Jeanne Chaney stated that one of her obstacles is managing various difficulties in life.  The Pt participated in the group discussion and accepted the worksheet that was provided.  The Pt demonstrated knowledge and understanding of the subject and how it applies to their own life.

## 2021-04-01 NOTE — Progress Notes (Signed)
Patient did attend the evening speaker AA meeting.  

## 2021-04-01 NOTE — Progress Notes (Signed)
Coraleigh reported having a good day today.  She was able to speak with her children and this was upsetting because they were crying on the phone.  She reports feeling remorse from the happenings yesterday.  She was glad that she has been "catching up on sleep."  She denied any SH/HI or AVH.  She verbally agreed to not harm herself on the unit.  She took prn vistaril and trazodone at bedtime.  She has been restless during the night.  Q 15 minute checks maintained for safety.    03/31/21 2104  Psych Admission Type (Psych Patients Only)  Admission Status Involuntary  Psychosocial Assessment  Patient Complaints Anxiety  Eye Contact Fair  Facial Expression Sad  Affect Anxious  Speech Logical/coherent  Interaction Assertive  Motor Activity Other (Comment) (unremarkable)  Appearance/Hygiene Unremarkable  Behavior Characteristics Anxious  Mood Depressed;Anxious  Thought Process  Coherency WDL  Content WDL  Delusions None reported or observed  Perception WDL  Hallucination None reported or observed  Judgment Poor  Confusion None  Danger to Self  Current suicidal ideation? Denies  Danger to Others  Danger to Others None reported or observed

## 2021-04-02 MED ORDER — ONDANSETRON HCL 4 MG PO TABS: 4.0000 mg | ORAL_TABLET | Freq: Once | ORAL | Status: DC | PRN

## 2021-04-02 MED ORDER — WHITE PETROLATUM EX OINT
TOPICAL_OINTMENT | CUTANEOUS | Status: AC
  Administered 2021-04-02: 1
  Filled 2021-04-02: qty 5

## 2021-04-02 MED ORDER — ONDANSETRON HCL 4 MG PO TABS: 4.0000 mg | ORAL_TABLET | Freq: Three times a day (TID) | ORAL | Status: DC | PRN

## 2021-04-02 MED ORDER — TRAZODONE 25 MG HALF TABLET
25.0000 mg | ORAL_TABLET | Freq: Once | ORAL | Status: AC
  Administered 2021-04-02: 25 mg via ORAL
  Filled 2021-04-02: qty 1

## 2021-04-02 MED ORDER — ONDANSETRON HCL 4 MG PO TABS: 4.0000 mg | ORAL_TABLET | Freq: Once | ORAL | Status: DC

## 2021-04-02 NOTE — [facility] Group Notes (Signed)
LCSW Group Therapy Note 04/02/2021  10:00-11:00am  Type of Therapy and Topic:  Group Therapy: Anger and Commonalities  Participation Level:  Active   Description of Group: In this group, patients initially shared an "unknown" fact about themselves and CSW led a discussion about the ways in which we have things in common without realizing it.  Patient then identified a recent time they became angry and how this yet again showed a way in which they had something in common with other patients.  We discussed possible unhealthy reactions to anger and possible healthy reactions.  We also discussed possible underlying emotions that lead to the anger.  Commonalities among group members were pointed out throughout the entirety of group.  Therapeutic Goals: Patients were asked to share something about themselves and learned that they often have things in common with other people without knowing this Patients will remember their last incident of anger and how they reacted Patients will be able to identify their reaction as healthy or unhealthy, and identify possible reactions that would have been the opposite Patients will learn that anger itself is a secondary emotion and will think about their primary emotion at the time of their last incident of anger  Summary of Patient Progress:  The patient did not want to share something interesting could share about herself, appeared to be angry and upset when she first arrived in group.  The patient said a frequent cause of anger is the death of her father 7 moths and said he was the one person in her life she could confide in.  Her current mode of coping is to "take it out on people."  Her conclusion by the end of group was that this chosen method of coping is unhealthy.  She also talked about her tendency to isolate herself because she simply cannot handle what other people are saying.  The group suggested that she tell those people what it is they are talking about  that is too hard for her, and only isolate herself if they continue to cross that boundary.  She was amenable to this suggestion.  Throughout group her facial muscles became more relaxed and she appeared to be significantly less upset.  Therapeutic Modalities:   Cognitive Behavioral Therapy  Lynnell Chad, LCSW 04/02/2021  9:43 AM

## 2021-04-02 NOTE — Progress Notes (Signed)
[facility] Group Notes:  (Nursing/MHT/Case Management/Adjunct)  Date:  04/02/2021  Time:  2015  Type of Therapy:   wrap up group  Participation Level:  Active  Participation Quality:  Appropriate, Attentive, Sharing, and Supportive  Affect:  Appropriate  Cognitive:  Appropriate  Insight:  Improving  Engagement in Group:  Engaged  Modes of Intervention:  Clarification, Education, and Support  Summary of Progress/Problems: Positive thinking and positive change were discussed.   Marcille Buffy 04/02/2021, 9:03 PM

## 2021-04-02 NOTE — Progress Notes (Addendum)
D. Pt presented as anxious, but was friendly- smiling upon initial approach. Per pt's self inventory, pt rated her depression, hopelessness and anxiety a 5/5/4, respectively. Pt observed attending group led by SW this am.  Pt currently denies pain, and reports that she is tolerating medication well. Pt denies SI/HI and AVH   A. Labs and vitals monitored. Pt given and educated on medications. Pt supported emotionally and encouraged to express concerns and ask questions.   R. Pt remains safe with 15 minute checks. Will continue POC.

## 2021-04-02 NOTE — Progress Notes (Signed)
[facility] MD Progress Note  04/02/2021 10:26 AM Jeanne Chaney  MRN:  [id number]  CC: MDD and Suicide attempt  Subjective:  Jeanne Chaney is a 30 y.o. female, with reported past psychiatric history of depression. Patient presents INVOLUNTARILY to [facility] (03/30/2021), transferred from [facility] for worsening depression since death of father 7 months ago and SA by trying to jump out of a 32F window.  [facility] stay day 3.   24hr events:  The patient's chart was reviewed and nursing notes were reviewed. The patient's case was discussed in multidisciplinary team meeting.  Per MAR: - Patient is compliant with scheduled meds. - PRNs: Vistaril and trazodone for sleep Patient slept Number of Hours: 5.5.   Today (04/02/2021): The patient was seen and evaluated on the unit.  On initial interview patient is pleasant and affable. Her thought process is linear and logical. She reports going to the AA group last night and says that despite having no alcohol use problems, she was able to learn from the stories people shared. When asked about SI, she denies any currently. She denies medication side effects. However, MD was given report by nursing of complaint of nausea. PRN Zofran ordered once. She reports her last SI as Wednesday, the day of her arrival. When asked what she would do if she were to have thoughts of suicide again, she says "I would check myself right back in". She also reports wanting to work with a therapist and says that she would go to this person for support. She asks about discharge and states that she was supposed to be discharged today. When told this is not the plan, patient becomes tearful. On further discussion, patient is informed that she is here under IVC. The patient is surprised by this and says that she thought the IVC was dropped. Patient given encouragement and support.    Principal Problem: Severe episode of recurrent major depressive disorder, without psychotic features  (HCC) Diagnosis: Principal Problem:   Severe episode of recurrent major depressive disorder, without psychotic features (HCC)  Total Time spent with patient: 30 minutes  Past Psychiatric History:  Patient stated that she had attempted to harm her self at a very young age and was placed in the counseling.  No medications or hospitalizations.  She also had thoughts of harming herself at approximately age 76.  She did not seek either psychiatric evaluations or therapy at that time.  No previous psychiatric medications.  Past Medical History:  Past Medical History:  Diagnosis Date   Herpes genitalia    Migraines    Trichimoniasis    Yeast infection    History reviewed. No pertinent surgical history. Family History:  Family History  Problem Relation Age of Onset   Asthma Mother    Family Psychiatric  History: See H&P Social History:  Social History   Substance and Sexual Activity  Alcohol Use No     Social History   Substance and Sexual Activity  Drug Use Yes   Types: Marijuana    Social History   Socioeconomic History   Marital status: Single    Spouse name: Not on file   Number of children: Not on file   Years of education: Not on file   Highest education level: Not on file  Occupational History   Not on file  Tobacco Use   Smoking status: Never   Smokeless tobacco: Never  Vaping Use   Vaping Use: Never used  Substance and Sexual Activity  Alcohol use: No   Drug use: Yes    Types: Marijuana   Sexual activity: Yes    Birth control/protection: Implant  Other Topics Concern   Not on file  Social History Narrative   Not on file   Social Determinants of Health   Financial Resource Strain: Not on file  Food Insecurity: Not on file  Transportation Needs: Not on file  Physical Activity: Not on file  Stress: Not on file  Social Connections: Not on file   Additional Social History:    See H&P  Sleep: Fair  Appetite:  Poor  Current  Medications: Current Facility-Administered Medications  Medication Dose Route Frequency Provider Last Rate Last Admin   acetaminophen (TYLENOL) tablet 650 mg  650 mg Oral Q6H PRN Jaclyn Shaggy, PA-C       alum & mag hydroxide-simeth (MAALOX/MYLANTA) 200-200-20 MG/5ML suspension 30 mL  30 mL Oral Q4H PRN Melbourne Abts W, PA-C       citalopram (CELEXA) tablet 20 mg  20 mg Oral Daily Princess Bruins, DO   20 mg at 04/02/21 0830   feeding supplement (BOOST / RESOURCE BREEZE) liquid 1 Container  1 Container Oral Q24H Antonieta Pert, MD   1 Container at 04/01/21 0913   feeding supplement (ENSURE ENLIVE / ENSURE PLUS) liquid 237 mL  237 mL Oral Q24H Antonieta Pert, MD   237 mL at 03/31/21 2104   hydrOXYzine (ATARAX/VISTARIL) tablet 25 mg  25 mg Oral TID PRN Jaclyn Shaggy, PA-C   25 mg at 04/02/21 7035   magnesium hydroxide (MILK OF MAGNESIA) suspension 30 mL  30 mL Oral Daily PRN Melbourne Abts W, PA-C       multivitamin with minerals tablet 1 tablet  1 tablet Oral Daily Antonieta Pert, MD   1 tablet at 04/02/21 0830   ondansetron (ZOFRAN) tablet 4 mg  4 mg Oral Q8H PRN Carlyn Reichert, MD       traZODone (DESYREL) tablet 25 mg  25 mg Oral QHS PRN Antonieta Pert, MD   25 mg at 04/01/21 2107    Lab Results:  No results found for this or any previous visit (from the past 48 hour(s)).   Blood Alcohol level:  No results found for: [facility]  Metabolic Disorder Labs: Lab Results  Component Value Date   HGBA1C 5.5 03/31/2021   MPG 111.15 03/31/2021   No results found for: PROLACTIN Lab Results  Component Value Date   CHOL 163 03/31/2021   TRIG 40 03/31/2021   HDL 57 03/31/2021   CHOLHDL 2.9 03/31/2021   VLDL 8 03/31/2021   LDLCALC 98 03/31/2021    Physical Findings:  Musculoskeletal: Strength & Muscle Tone: within normal limits Gait & Station: normal Patient leans: N/A  Psychiatric Specialty Exam:  Presentation  General Appearance: Appropriate for Environment; Casual  Eye  Contact:Good  Speech:Clear and Coherent; Normal Rate  Speech Volume:Decreased  Handedness: Right  Mood and Affect  Mood: "good"  Affect:Tearful; Depressed   Thought Process  Thought Processes:Coherent; Goal Directed; Linear  spontaneous   Descriptions of Associations:Intact  Orientation:Full (Time, Place and Person)  Thought Content:Logical  Patient denied SI/HI/AVH, delusions, paranoia, first rank symptoms. Patient is not grossly responding to internal/external stimuli on exam and did not make delusional statements.  History of Schizophrenia/Schizoaffective disorder:No  Duration of Psychotic Symptoms:No data recorded Hallucinations:Hallucinations: None  Ideas of Reference:None  Suicidal Thoughts:Suicidal Thoughts: No  Homicidal Thoughts:Homicidal Thoughts: No   Sensorium  Memory:Immediate Good; Recent Good;  Remote Good  Judgment:Fair  Insight:Shallow   Executive Functions  Concentration:Good  Attention Span:Good  Recall:Good  Fund of Knowledge:Good  Language:Good   Psychomotor Activity  Psychomotor Activity:Psychomotor Activity: Normal   Assets  Assets:Communication Skills; Housing; Desire for Improvement; Social Support; Resilience; Physical Health   Sleep  Sleep:Sleep: Fair Number of Hours of Sleep: 5  Patient works night shift, said that she is usually awake in the morning.  Physical Exam: Physical Exam Vitals and nursing note reviewed.  HENT:     Head: Normocephalic.  Pulmonary:     Effort: Pulmonary effort is normal.  Neurological:     Mental Status: She is alert and oriented to person, place, and time.   Review of Systems  Respiratory:  Negative for shortness of breath.   Cardiovascular:  Negative for chest pain.  Gastrointestinal:  Negative for diarrhea, nausea and vomiting.  Neurological:  Negative for headaches.  Blood pressure 128/86, pulse 71, temperature 98.6 F (37 C), temperature source Oral, resp. rate 16, height  5\' 6"  (1.676 m), weight 78.5 kg, SpO2 100 %. Body mass index is 27.92 kg/m.   Treatment Plan Summary: Daily contact with patient to assess and evaluate symptoms and progress in treatment and Medication management  Makinsey A Livingston is a 30 y.o. female, with reported past psychiatric history of depression. Patient presents INVOLUNTARILY to [facility] (03/30/2021), transferred from [facility] for worsening depression since death of father 7 months ago and SA by trying to jump out of a 42F window.  [facility] stay day 3.     ASSESSMENT: Diagnosis:  - Severe episode of recurrent MDD, without psychotic features   PSYCHIATRIC DIAGNOSIS & TREATMENT:  Severe episode of recurrent MDD, without psychotic features Continue Celexa 20 mg p.o. daily  CBC WNL (8/11)  CMP WNL (8/11)  TSH 1.549 (8/11)   PRN's -Trazadone 50mg  PO qHS PRN for insomnia -Hydroxyzine 25mg  PO TID PRN for anxiety -Alum & mag hydroxide-simeth 69ml PO qHS PRN for GERD -Magnesium hydroxide 74ml PO daily PRN for constipation --One time PRN order for Zofran (8/13)  Discharge Planning: -Social work and case management to assist with discharge planning and identification of hospital follow-up needs prior to discharge -Estimated LOS: 3-4 days -Discharge Concerns: Need to establish a safety plan; Medication compliance and effectiveness -Discharge Goals: Return home with outpatient referrals for mental health follow-up including medication management/psychotherapy  Safety and Monitoring: -Involuntary admission to inpatient psychiatric unit for safety, stabilization and treatment -Daily contact with patient to assess and evaluate symptoms and progress in treatment -Patient's case to be discussed in multi-disciplinary team meeting -Observation Level : q15 minute checks -Vital signs: q12 hours -Precautions: suicide, elopement, and assault   31m PGY-1, Psychiatry

## 2021-04-02 NOTE — Progress Notes (Signed)
   04/02/21 0007  Psych Admission Type (Psych Patients Only)  Admission Status Involuntary  Psychosocial Assessment  Patient Complaints None  Eye Contact Fair  Facial Expression Other (Comment) (smiling)  Affect Appropriate to circumstance  Speech Logical/coherent  Interaction Assertive  Appearance/Hygiene Unremarkable  Behavior Characteristics Appropriate to situation;Cooperative  Mood Pleasant  Thought Process  Coherency WDL  Content WDL  Delusions WDL  Perception WDL  Hallucination None reported or observed  Judgment WDL  Confusion WDL  Danger to Self  Current suicidal ideation? Denies  Danger to Others  Danger to Others None reported or observed

## 2021-04-02 NOTE — Progress Notes (Signed)
   04/02/21 2209  Psych Admission Type (Psych Patients Only)  Admission Status Involuntary  Psychosocial Assessment  Patient Complaints Depression;Insomnia  Eye Contact Fair  Facial Expression Other (Comment) (appropriate)  Affect Appropriate to circumstance  Speech Logical/coherent  Interaction Assertive  Motor Activity Other (Comment) (wdl)  Appearance/Hygiene Unremarkable  Behavior Characteristics Appropriate to situation  Mood Pleasant  Thought Process  Coherency WDL  Content WDL  Delusions None reported or observed  Hallucination None reported or observed  Judgment Poor  Confusion None  Danger to Self  Current suicidal ideation? Denies  Danger to Others  Danger to Others None reported or observed

## 2021-04-03 MED ORDER — ONDANSETRON HCL 4 MG PO TABS: 4.0000 mg | ORAL_TABLET | Freq: Three times a day (TID) | ORAL | 0 refills | Status: DC | PRN

## 2021-04-03 MED ORDER — TRAZODONE HCL 50 MG PO TABS: 50.0000 mg | ORAL_TABLET | Freq: Every evening | ORAL | Status: DC | PRN

## 2021-04-03 MED ORDER — CITALOPRAM HYDROBROMIDE 20 MG PO TABS: 20.0000 mg | ORAL_TABLET | Freq: Every day | ORAL | 0 refills | Status: DC

## 2021-04-03 NOTE — Progress Notes (Signed)
Pt discharged to lobby. Pt was stable and appreciative at that time. All papers and electronic prescriptions were given and valuables returned. Verbal understanding expressed. Denies SI/HI and A/VH. Pt given opportunity to express concerns and ask questions.  

## 2021-04-03 NOTE — Progress Notes (Signed)
   04/03/21 0900  Psych Admission Type (Psych Patients Only)  Admission Status Involuntary  Psychosocial Assessment  Patient Complaints None  Eye Contact Fair  Facial Expression Other (Comment) (appropriate)  Affect Appropriate to circumstance  Speech Logical/coherent  Interaction Assertive  Motor Activity Other (Comment) (wdl)  Appearance/Hygiene Unremarkable  Behavior Characteristics Cooperative;Appropriate to situation  Mood Pleasant  Thought Process  Coherency WDL  Content WDL  Delusions None reported or observed  Perception WDL  Hallucination None reported or observed  Judgment Poor  Confusion None  Danger to Self  Current suicidal ideation? Denies  Danger to Others  Danger to Others None reported or observed

## 2021-04-03 NOTE — Discharge Summary (Signed)
Physician Discharge Summary Note  Patient:  Jeanne Chaney is an 30 y.o., female MRN:  [id number] DOB:  23-Nov-1990 Patient phone:  [phone] (home)  Patient address:   [address] [place] [address],  Total Time spent with patient: 45 minutes  Date of Admission:  03/30/2021 Date of Discharge: 04/03/2021  Reason for Admission:  Jeanne Chaney is a 30 y.o. female, with reported past psychiatric history of depression. Patient presents INVOLUNTARILY to [facility] (03/30/2021), transferred from [facility] for worsening depression since death of father 7 months ago and SA by trying to jump out of a 80F window.  Per H&P: " The patient stated that she has been having a difficult time for an unspecified amount of time.  She stated that most recently she had gotten more depressed after the death of her father 7 months ago.  She stated her father had passed away from cancer.  He was very young in his 32s.  He and his current wife lived in [place].  The patient stated that she always felt as though her father was supportive.  She stated that her local family here is not supportive.  She stated that she was having difficulty with her family members, and then yesterday things became even worse when she recognized that it was 7 months to the day of the death of her father.  She admitted to crying spells, helplessness, hopelessness and worthlessness.  She was also having suicidal thoughts, but did not act on her actions.  She stated that she had attempted to harm her self twice in the past.  1 when she was very young at age 60, and another time at approximately age 67.  When she was very young she did get counseling, but at age 57 she did not.  She has not been admitted to the hospital psychiatrically, nor taken psychiatric medications in the past.  She was admitted to the hospital for evaluation and stabilization.  She denied any history of physical, emotional or sexual trauma.  No  psychotic symptoms.  She was tearful throughout the interview, but did request to be able to be discharged.  We discussed that.  She has 2 young children.  The children are in the custody of their biological father.  The biological father lives in [place].  She stated that the biological father is supportive of the children, but not of her.  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  She did admit to marijuana use, but no other drugs or alcohol. "  Principal Problem: Severe episode of recurrent major depressive disorder, without psychotic features Jeanne Chaney) Discharge Diagnoses: Principal Problem:   Severe episode of recurrent major depressive disorder, without psychotic features [facility])   Past Psychiatric History:  Patient stated that she had attempted to harm her self at a very young age and was placed in the counseling.  No medications or hospitalizations.  She also had thoughts of harming herself at approximately age 20.  She did not seek either psychiatric evaluations or therapy at that time.  No previous psychiatric medications.  Past Medical History:  Past Medical History:  Diagnosis Date   Herpes genitalia    Migraines    Trichimoniasis    Yeast infection    History reviewed. No pertinent surgical history. Family History:  Family History  Problem Relation Age of Onset   Asthma Mother    Family Psychiatric  History:  Patient denied any  Social History:  Social History   Substance and Sexual Activity  Alcohol Use No     Social History   Substance and Sexual Activity  Drug Use Yes   Types: Marijuana    Social History   Socioeconomic History   Marital status: Single    Spouse name: Not on file   Number of children: Not on file   Years of education: Not on file   Highest education level: Not on file  Occupational History   Not on file  Tobacco Use   Smoking status: Never   Smokeless tobacco: Never  Vaping Use   Vaping Use:  Never used  Substance and Sexual Activity   Alcohol use: No   Drug use: Yes    Types: Marijuana   Sexual activity: Yes    Birth control/protection: Implant  Other Topics Concern   Not on file  Social History Narrative   Not on file   Social Determinants of Health   Financial Resource Strain: Not on file  Food Insecurity: Not on file  Transportation Needs: Not on file  Physical Activity: Not on file  Stress: Not on file  Social Connections: Not on file    Hospital Course:  Jeanne Chaney is a 30 y.o. female, with reported past psychiatric history of depression. Patient presents INVOLUNTARILY to [facility] (03/30/2021), transferred from [facility]aney for worsening depression since death of father 7 months ago and SA by trying to jump out of a 37F window.   This is patient's first psychiatric admission. Admitting UDS Was positive for marijuana.  CIWA protocol was not initiated.   INITIALLY, patient was very depressed and teary. Did not have previous psych medical regimen.  Severe episode of recurrent MDD, without psychotic features Started on Celexa 10 mg p.o. daily then increased it to Celexa 20 mg p.o. daily  Patient tolerated the medication and did not report any side effects besides mild drowsiness.  Patient did not display any dangerous, violent or suicidal behavior on the unit.  The medication regimen targeting those presenting symptoms were discussed with patient & initiated with patient's consent. Besides medical management, patient was also enrolled & participated in the group counseling sessions being offered & held on this unit. Patient learned coping skills. Patient presented no other significant pre-existing medical issues that required treatment.  Patient tolerated this treatment regimen without any adverse effects or reactions reported.   During the course of patient's hospitalization, the 15-minute checks were adequate to ensure patient's safety.   Patient interacted with patients & staff appropriately, participated appropriately in the group sessions/therapies. Patient's medications were addressed & adjusted to meet his/her needs. Patient was recommended for outpatient follow-up care & medication management upon discharge to assure continuity of care & mood stability.    At the time of discharge patient is not reporting any acute suicidal/homicidal ideations. Patient feels more confident about his/her self-care & in managing his mental health. Patient currently denies any new issues or concerns.  Education and supportive counseling provided throughout his/her hospital stay & upon discharge.   Today upon discharge evaluation. Patient is doing well and denies any other specific concerns. Patient is sleeping well and appetite is good. Patient denies other physical complaints. Patient denies AH/VH, delusional thoughts or paranoia, and does not appear to be responding to any internal stimuli. Patient feels that their medications have been helpful & is in agreement to continue this current treatment regimen as recommended. Patient was able to engage in safety  planning including [facility] or contact emergency services if patient feels unable to maintain their own safety or the safety of others. Patient had no further questions, comments, or concerns. [facility] in no apparent distress.  Transportation per [employer]*Safe Transportation*.    Physical Findings:  Musculoskeletal: Strength & Muscle Tone: within normal limits Gait & Station: normal, steady Patient leans: N/A   Psychiatric Specialty Exam:  Presentation  General Appearance: Casual; Fairly Groomed, adequate hygiene  Eye Contact:Good  Speech:Clear and Coherent; Normal Rate, spontaneous  Speech Volume:Normal  Handedness:Right   Mood and Affect  Mood:Euthymic  Affect:Congruent   Thought Process  Thought Processes:Coherent; Linear;  Goal Directed  Descriptions of Associations:Intact  Orientation:Full (Time, Place and Person)  Thought Content:Logical; WDL Patient denied SI/HI/AVH, delusions, paranoia, first rank symptoms. Patient is not grossly responding to internal/external stimuli on exam and did not make delusional statements.  History of Schizophrenia/Schizoaffective disorder:No  Duration of Psychotic Symptoms:No data recorded Hallucinations:Hallucinations: None Ideas of Reference:None  Suicidal Thoughts:Suicidal Thoughts: No Homicidal Thoughts:Homicidal Thoughts: No  Sensorium  Memory:Immediate Good; Recent Good; Remote Good  Judgment:Good  Insight:Fair   Executive Functions  Concentration:Good  Attention Span:Good  Recall:Good  Fund of Knowledge:Good  Language:Good   Psychomotor Activity  Psychomotor Activity: Psychomotor Activity: Normal  Assets  Assets:Communication Skills; Desire for Improvement; Financial Resources/Insurance; Housing; Physical Health; Resilience; Social Support   Sleep  Sleep: Sleep: Fair Number of Hours of Sleep: 5.25 patient typically works third shift   Physical Exam: Physical Exam Vitals and nursing note reviewed.  HENT:     Head: Normocephalic.  Pulmonary:     Effort: Pulmonary effort is normal.  Neurological:     Mental Status: She is alert and oriented to person, place, and time.   Review of Systems  Respiratory:  Negative for shortness of breath.   Cardiovascular:  Negative for chest pain.  Gastrointestinal:  Negative for abdominal pain, nausea and vomiting.  Blood pressure 126/86, pulse 81, temperature 98.9 F (37.2 C), temperature source Oral, resp. rate 16, height 5\' 6"  (1.676 m), weight 78.5 kg, SpO2 100 %. Body mass index is 27.92 kg/m.   Social History   Tobacco Use  Smoking Status Never  Smokeless Tobacco Never   Tobacco Cessation:  N/A, patient does not currently use tobacco products   Blood Alcohol level:  No results found  for: [facility]  Metabolic Disorder Labs:  Lab Results  Component Value Date   HGBA1C 5.5 03/31/2021   MPG 111.15 03/31/2021   No results found for: PROLACTIN Lab Results  Component Value Date   CHOL 163 03/31/2021   TRIG 40 03/31/2021   HDL 57 03/31/2021   CHOLHDL 2.9 03/31/2021   VLDL 8 03/31/2021   LDLCALC 98 03/31/2021    See Psychiatric Specialty Exam and Suicide Risk Assessment completed by Attending Physician prior to discharge.  Discharge destination:  Home  Is patient on multiple antipsychotic therapies at discharge:  No   Has Patient had three or more failed trials of antipsychotic monotherapy by history:  No  Recommended Plan for Multiple Antipsychotic Therapies: NA  Discharge Instructions     Diet - low sodium heart healthy   Complete by: As directed    Increase activity slowly   Complete by: As directed       Allergies as of 04/03/2021   No Known Allergies      Medication List     TAKE these medications  Indication  citalopram 20 MG tablet Commonly known as: CELEXA Take 1 tablet (20 mg total) by mouth daily. Start taking on: April 04, 2021  Indication: Major Depressive Disorder   IMPLANON [place] Inject 1 application into the skin once.  Indication: birth control   ondansetron 4 MG tablet Commonly known as: ZOFRAN Take 1 tablet (4 mg total) by mouth every 8 (eight) hours as needed for up to 8 doses for nausea or vomiting.  Indication: Nausea and Vomiting        Follow-up Information     [facility]. Call.   Specialty: Hospice and Palliative Medicine Why: Please contact this provider personally to schedule an appointment for bereavement/grief counseling services. Contact information: [address] [place] [address] [phone]        [facility]. Go to.   Specialty: Behavioral Health Why: Please go to this provider for therapy and medication management services during walk in  hours:  Monday through Wednesday, from 7:45 am to 10:30 am.  Services are provided on a first come, first served basis. Contact information: [address] [place] [address] [phone]                Follow-up recommendations:   - Activity as tolerated. - Diet as recommended by PCP. - Keep all scheduled follow-up appointments as recommended.  Patient is instructed to take all prescribed medications as recommended. Report any side effects or adverse reactions to your outpatient psychiatrist. Patient is instructed to abstain from alcohol and illegal drugs while on prescription medications. In the event of worsening symptoms, patient is instructed to call the crisis hotline, 911, or go to the nearest emergency department for evaluation and treatment.  Comments:   Prescriptions given at discharge. Patient agreeable to plan. Given opportunity to ask questions. Appears to feel comfortable with discharge denies any current suicidal or homicidal thought.  Patient is also instructed prior to discharge to: Take all medications as prescribed by mental healthcare provider. Report any adverse effects and or reactions from the medicines to outpatient provider promptly. Patient has been instructed & cautioned: To not engage in alcohol and or illegal drug use while on prescription medicines. In the event of worsening symptoms,  patient is instructed to call the crisis hotline, 911 and or go to the nearest ED for appropriate evaluation and treatment of symptoms. To follow-up with primary care provider for other medical issues, concerns and or health care needs  The patient was evaluated each day by a clinical provider to ascertain response to treatment. Improvement was noted by the patient's report of decreasing symptoms, improved sleep and appetite, affect, medication tolerance, behavior, and participation in unit programming.  Patient was asked each day to complete a self inventory noting  mood, mental status, pain, new symptoms, anxiety and concerns.  Patient responded well to medication and being in a therapeutic and supportive environment. Positive and appropriate behavior was noted and the patient was motivated for recovery. The patient worked closely with the treatment team and case manager to develop a discharge plan with appropriate goals. Coping skills, problem solving as well as relaxation therapies were also part of the unit programming.  By the day of discharge patient was in much improved condition than upon admission.  Symptoms were reported as significantly decreased or resolved completely. The patient denied SI/HI and voiced no AVH. The patient was motivated to continue taking medication with a goal of continued improvement in mental health.   Patient  was discharged home with a plan to follow up as noted.   Signed: Princess Bruins, DO 04/03/2021, 5:37 PM

## 2021-04-03 NOTE — [facility] Group Notes (Signed)
[facility] LCSW Group Therapy Note  04/03/2021    Type of Therapy and Topic:  Group Therapy:  Adding Supports Including Yourself  Participation Level:  Active   Description of Group:   Patients in this group were introduced to the concept that additional supports including self-support are an essential part of recovery.  Patients listed their current healthy and unhealthy supports, and discussed the difference between the two.   Several songs were played and a group discussion ensued in which patients stated they could relate to the songs which inspired them to realize they have be willing to help themselves in order to succeed, because other people cannot achieve sobriety or stability for them.  Parents were encouraged toward self-advocacy and self-support as part of their recovery.  They discussed their reactions to these songs' messages, which were positive and hopeful.  Before group ended, they identified the supports they believe they need to add to their lives to achieve their goals at discharge.   Therapeutic Goals: 1)  explain the difference between healthy and unhealthy supports and discuss what specific supports are currently in patients' lives 2)  demonstrate the importance of being a key part of one's own support system 3)  discuss the need for appropriate boundaries with supports 4)  elicit ideas from patients about supports that need to be added in order to achieve goals   Summary of Patient Progress:   The patient listed expressed that supports she needs to add at discharge include trusting people, opening up, and accepting her father's death.  It was pointed out to her that being a healthier self-support would lead to the first two on her list and going to grief counseling as suggested in her discharge plan will help with the third.    Therapeutic Modalities:   Motivational Interviewing Activity  Lynnell Chad

## 2021-04-03 NOTE — Progress Notes (Signed)
  [facility] Adult Case Management Discharge Plan :  Will you be returning to the same living situation after discharge:  Yes,  alone with children At discharge, do you have transportation home?: Yes,  daughter's father Do you have the ability to pay for your medications: No.  Release of information consent forms completed and emailed to Medical Records, then turned in to Medical Records by CSW.   Patient to Follow up at:  Follow-up Information     [facility]. Call.   Specialty: Hospice and Palliative Medicine Why: Please contact this provider personally to schedule an appointment for bereavement/grief counseling services. Contact information: [address] [place] [address] [phone]        [facility]. Go to.   Specialty: Behavioral Health Why: Please go to this provider for therapy and medication management services during walk in hours:  Monday through Wednesday, from 7:45 am to 10:30 am.  Services are provided on a first come, first served basis. Contact information: [address] [place] [address] [phone]                Next level of care provider has access to [facility] Link:yes  Safety Planning and Suicide Prevention discussed: Yes,  with stepmother     Has patient been referred to the Quitline?: N/A patient is not a smoker  Patient has been referred for addiction treatment: Yes  Lynnell Chad, LCSW 04/03/2021, 9:56 AM

## 2021-04-03 NOTE — [facility] Suicide Risk Assessment (Signed)
[facility] Discharge Suicide Risk Assessment   Principal Problem: Severe episode of recurrent major depressive disorder, without psychotic features (HCC) Discharge Diagnoses: Principal Problem:   Severe episode of recurrent major depressive disorder, without psychotic features (HCC)   Total Time spent with patient: 15 minutes  Musculoskeletal: Strength & Muscle Tone: within normal limits Gait & Station: normal Patient leans: N/A  Psychiatric Specialty Exam  Presentation  General Appearance: Appropriate for Environment; Casual  Eye Contact:Good  Speech:Clear and Coherent; Normal Rate  Speech Volume:Decreased  Handedness:Right   Mood and Affect  Mood:Depressed  Duration of Depression Symptoms: Greater than two weeks  Affect:Tearful; Depressed   Thought Process  Thought Processes:Coherent; Goal Directed; Linear  Descriptions of Associations:Intact  Orientation:Full (Time, Place and Person)  Thought Content:Logical  History of Schizophrenia/Schizoaffective disorder:No  Duration of Psychotic Symptoms:No data recorded Hallucinations:No data recorded Ideas of Reference:None  Suicidal Thoughts:No data recorded Homicidal Thoughts:No data recorded  Sensorium  Memory:Immediate Good; Recent Good; Remote Good  Judgment:Fair  Insight:Shallow   Executive Functions  Concentration:Good  Attention Span:Good  Recall:Good  Fund of Knowledge:Good  Language:Good   Psychomotor Activity  Psychomotor Activity: No data recorded  Assets  Assets:Communication Skills; Housing; Desire for Improvement; Social Support; Resilience; Physical Health   Sleep  Sleep: No data recorded  Physical Exam: Physical Exam Vitals and nursing note reviewed.  Constitutional:      Appearance: Normal appearance.  HENT:     Head: Normocephalic and atraumatic.  Pulmonary:     Effort: Pulmonary effort is normal.  Neurological:     General: No focal deficit present.     Mental  Status: She is alert and oriented to person, place, and time.   Review of Systems  Gastrointestinal:  Positive for nausea.  All other systems reviewed and are negative. Blood pressure 126/86, pulse 81, temperature 98.9 F (37.2 C), temperature source Oral, resp. rate 16, height 5\' 6"  (1.676 m), weight 78.5 kg, SpO2 100 %. Body mass index is 27.92 kg/m.  Mental Status Per Nursing Assessment::   On Admission:  NA  Demographic Factors:  Low socioeconomic status  Loss Factors: Loss of significant relationship  Historical Factors: Impulsivity  Risk Reduction Factors:   Responsible for children under 4 years of age and Living with another person, especially a relative  Continued Clinical Symptoms:  Depression:   Impulsivity  Cognitive Features That Contribute To Risk:  None    Suicide Risk:  Minimal: No identifiable suicidal ideation.  Patients presenting with no risk factors but with morbid ruminations; may be classified as minimal risk based on the severity of the depressive symptoms   Follow-up Information     [facility]. Call.   Specialty: Hospice and Palliative Medicine Why: Please contact this provider personally to schedule an appointment for bereavement/grief counseling services. Contact information: [address] [place] [phone]        [facility]. Go to.   Specialty: Behavioral Health Why: Please go to this provider for therapy and medication management services during walk in hours:  Monday through Wednesday, from 7:45 am to 10:30 am.  Services are provided on a first come, first served basis. Contact information: [address] [place] [phone]                Plan Of Care/Follow-up recommendations:  Activity:  ad lib  [phone], MD 04/03/2021, 9:56 AM

## 2021-04-03 NOTE — [facility] Suicide Risk Assessment (Signed)
[facility] INPATIENT:  Family/Significant Other Suicide Prevention Education  Suicide Prevention Education:  Education Completed; stepmother Jeanne Chaney [phone],  (name of family member/significant other) has been identified by the patient as the family member/significant other with whom the patient will be residing, and identified as the person(s) who will aid the patient in the event of a mental health crisis (suicidal ideations/suicide attempt).  With written consent from the patient, the family member/significant other has been provided the following suicide prevention education, prior to the and/or following the discharge of the patient.  The suicide prevention education provided includes the following: Suicide risk factors Suicide prevention and interventions National Suicide Hotline telephone number [facility] assessment telephone number [facility] Emergency Assistance 911 [facility] and/or Residential Mobile Crisis Unit telephone number  Request made of family/significant other to: Remove weapons (e.g., guns, rifles, knives), all items previously/currently identified as safety concern.   Remove drugs/medications (over-the-counter, prescriptions, illicit drugs), all items previously/currently identified as a safety concern.  The family member/significant other verbalizes understanding of the suicide prevention education information provided.  The family member/significant other agrees to remove the items of safety concern listed above.  Stepmother is a Engineer, civil (consulting) and understands the suicide prevention protocols.  She was happy to hear about patient's medicines and follow-up, and will encourage her to go to therapy for her grief.  Carloyn Jaeger Grossman-Orr 04/03/2021, 9:55 AM

## 2021-04-03 NOTE — [facility] Group Notes (Signed)
[facility] Group Notes:  (Nursing/MHT/Case Management/Adjunct)  Date:  04/03/2021  Time:  9:56 AM  Type of Therapy:  Group Therapy  Participation Level:  Active  Participation Quality:  Appropriate  Affect:  Appropriate  Cognitive:  Appropriate  Insight:  Appropriate  Engagement in Group:  Engaged  Modes of Intervention:  Discussion  Summary of Progress/Problems:Pt understands the functions of the unit and did not have any concerns.  Jeanne Chaney 04/03/2021, 9:56 AM

## 2021-04-03 NOTE — Progress Notes (Signed)
Spoke to patient on the phone today after she was discharged.  Informed patient's that her work note is still at the hospital, and will be available for her at our front desk. Also that her medicines were sent to the wrong [facility], however discussed with patient that she will be able to receive them.

## 2021-05-06 ENCOUNTER — Other Ambulatory Visit (INDEPENDENT_AMBULATORY_CARE_PROVIDER_SITE_OTHER): Payer: Self-pay | Admitting: Primary Care

## 2021-05-06 NOTE — Telephone Encounter (Signed)
Medication Refill - Medication: Celexa   Has the patient contacted their pharmacy? Yes.   She states that contacted pharmacy and that they told her to contact PCP due to having no refills. Pt received this medication from ED. Please advise.  (Agent: If no, request that the patient contact the pharmacy for the refill.) (Agent: If yes, when and what did the pharmacy advise?)  Preferred Pharmacy (with phone number or street name):   [id number] - [place] ([place]), [place] - [address]  [address] [place] ([place]) [place] [address]  Phone: [phone] Fax: [phone]  Hours: Not open 24 hours   Has the patient been seen for an appointment in the last year OR does the patient have an upcoming appointment? Yes.    Agent: Please be advised that RX refills may take up to 3 business days. We ask that you follow-up with your pharmacy.

## 2021-05-07 NOTE — Telephone Encounter (Signed)
PRESCRIBER NOT AT THIS PRACTICE
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
## 2021-05-23 ENCOUNTER — Ambulatory Visit (INDEPENDENT_AMBULATORY_CARE_PROVIDER_SITE_OTHER): Payer: Medicaid Other | Admitting: Primary Care

## 2021-06-09 ENCOUNTER — Other Ambulatory Visit (INDEPENDENT_AMBULATORY_CARE_PROVIDER_SITE_OTHER): Payer: Self-pay | Admitting: Primary Care

## 2021-06-09 NOTE — Telephone Encounter (Signed)
Medication Refill - Medication:  citalopram (CELEXA) 20 MG tablet  Has the patient contacted their pharmacy? Yes.   Contact PCP, different Provider prescribed medication.  Preferred Pharmacy (with phone number or street name):  [facility] - [place] ([place]), [place] - [address] Phone:  [phone]  Fax:  [phone]     Has the patient been seen for an appointment in the last year OR does the patient have an upcoming appointment? Yes.   On 11/04, pt requested a refill to hold her until then  Agent: Please be advised that RX refills may take up to 3 business days. We ask that you follow-up with your pharmacy.

## 2021-06-10 MED ORDER — CITALOPRAM HYDROBROMIDE 20 MG PO TABS: 20.0000 mg | ORAL_TABLET | Freq: Every day | ORAL | 0 refills | Status: DC

## 2021-06-10 NOTE — Telephone Encounter (Signed)
Requested medications are due for refill today yes  Requested medications are on the active medication list yes  Last visit 03/02/2020  Future visit scheduled 06/24/21  Notes to clinic  med ordered by a different provider, last visit was 02/2020, please assess.  Requested Prescriptions  Pending Prescriptions Disp Refills   citalopram (CELEXA) 20 MG tablet 30 tablet 0    Sig: Take 1 tablet (20 mg total) by mouth daily.     Psychiatry:  Antidepressants - SSRI Failed - 06/09/2021  3:19 PM      Failed - Valid encounter within last 6 months    Recent Outpatient Visits           1 year ago Current moderate episode of major depressive disorder, unspecified whether recurrent (HCC)   [facility] [name], NP   1 year ago Vaginal irritation   [facility] [name], NP   1 year ago Encounter for gynecological examination without abnormal finding   [facility] [name], NP   1 year ago Other migraine without status migrainosus, not intractable   [facility] [name], NP   2 years ago Vaginal itching   [facility] [name], NP       Future Appointments             In 2 weeks Randa Evens, Kinnie Scales, NP [facility]            Passed - Completed PHQ-2 or PHQ-9 in the last 360 days

## 2021-06-24 ENCOUNTER — Other Ambulatory Visit: Payer: Self-pay

## 2021-06-24 ENCOUNTER — Encounter (INDEPENDENT_AMBULATORY_CARE_PROVIDER_SITE_OTHER): Payer: Self-pay | Admitting: Primary Care

## 2021-06-24 ENCOUNTER — Ambulatory Visit (INDEPENDENT_AMBULATORY_CARE_PROVIDER_SITE_OTHER): Payer: Self-pay | Admitting: Primary Care

## 2021-06-24 DIAGNOSIS — F332 Major depressive disorder, recurrent severe without psychotic features: Secondary | ICD-10-CM

## 2021-06-24 MED ORDER — CITALOPRAM HYDROBROMIDE 20 MG PO TABS: 20.0000 mg | ORAL_TABLET | Freq: Every day | ORAL | 1 refills | Status: DC

## 2021-06-24 NOTE — Patient Instructions (Signed)
Major Depressive Disorder, Adult °Major depressive disorder is a mental health condition. This disorder affects feelings. It can also affect the body. Symptoms of this condition last most of the day, almost every day, for 2 weeks. This disorder can affect: °Relationships. °Daily activities, such as work and school. °Activities that you normally like to do. °What are the causes? °The cause of this condition is not known. The disorder is likely caused by a mix of things, including: °Your personality, such as being a shy person. °Your behavior, or how you act toward others. °Your thoughts and feelings. °Too much alcohol or drugs. °How you react to stress. °Health and mental problems that you have had for a long time. °Things that hurt you in the past (trauma). °Big changes in your life, such as divorce. °What increases the risk? °The following factors may make you more likely to develop this condition: °Having family members with depression. °Being a woman. °Problems in the family. °Low levels of some brain chemicals. °Things that caused you pain as a child, especially if you lost a parent or were abused. °A lot of stress in your life, such as from: °Living without basic needs of life, such as food and shelter. °Being treated poorly because of race, sex, or religion (discrimination). °Health and mental problems that you have had for a long time. °What are the signs or symptoms? °The main symptoms of this condition are: °Being sad all the time. °Being grouchy all the time. °Loss of interest in things and activities. °Other symptoms include: °Sleeping too much or too little. °Eating too much or too little. °Gaining or losing weight, without knowing why. °Feeling tired or having low energy. °Being restless and weak. °Feeling hopeless, worthless, or guilty. °Trouble thinking clearly or making decisions. °Thoughts of hurting yourself or others, or thoughts of ending your life. °Spending a lot of time alone. °Inability to  complete common tasks of daily life. °If you have very bad MDD, you may: °Believe things that are not true. °Hear, see, taste, or feel things that are not there. °Have mild depression that lasts for at least 2 years. °Feel very sad and hopeless. °Have trouble speaking or moving. °How is this treated? °This condition may be treated with: °Talk therapy. This teaches you to know bad thoughts, feelings, and actions and how to change them. °This can also help you to communicate with others. °This can be done with members of your family. °Medicines. These can be used to treat worry (anxiety), depression, or low levels of chemicals in the brain. °Lifestyle changes. You may need to: °Limit alcohol use. °Limit drug use. °Get regular exercise. °Get plenty of sleep. °Make healthy eating choices. °Spend more time outdoors. °Brain stimulation. This treatment excites the brain. This is done when symptoms are very bad or have not gotten better with other treatments. °Follow these instructions at home: °Activity °Get regular exercise as told. °Spend time outdoors as told. °Make time to do the things you enjoy. °Find ways to deal with stress. Try to: °Meditate. °Do deep breathing. °Spend time in nature. °Keep a journal. °Return to your normal activities as told by your doctor. Ask your doctor what activities are safe for you. °Alcohol and drug use °If you drink alcohol: °Limit how much you use to: °0-1 drink a day for women. °0-2 drinks a day for men. °Be aware of how much alcohol is in your drink. In the [place] one drink equals one 12 oz bottle of beer (355 mL),   one 5 oz glass of wine (148 mL), or one 1½ oz glass of hard liquor (44 mL). °Talk to your doctor about: °Alcohol use. Alcohol can affect some medicines. °Any drug use. °General instructions ° °Take over-the-counter and prescription medicines and herbal preparations only as told by your doctor. °Eat a healthy diet. °Get a lot of sleep. °Think about joining a support group.  Your doctor may be able to suggest one. °Keep all follow-up visits as told by your doctor. This is important. °Where to find more information: °National Alliance on Mental Illness: www.nami.org °[place] National Institute of Mental Health: www.nimh.nih.gov °American Psychiatric Association: www.psychiatry.org/patients-families/ °Contact a doctor if: °Your symptoms get worse. °You get new symptoms. °Get help right away if: °You hurt yourself. °You have serious thoughts about hurting yourself or others. °You see, hear, taste, smell, or feel things that are not there. °If you ever feel like you may hurt yourself or others, or have thoughts about taking your own life, get help right away. Go to your nearest emergency department or: °Call your local emergency services (911 in the [place] °Call a suicide crisis helpline, such as the National Suicide Prevention Lifeline at 1-800-273-8255 or 988 in the [place] This is open 24 hours a day in the [place] °Text the Crisis Text Line at 741741 (in the [place] °Summary °Major depressive disorder is a mental health condition. This disorder affects feelings. Symptoms of this condition last most of the day, almost every day, for 2 weeks. °The symptoms of this disorder can cause problems with relationships and with daily activities. °There are treatments and support for people who get this disorder. You may need more than one type of treatment. °Get help right away if you have serious thoughts about hurting yourself or others. °This information is not intended to replace advice given to you by your health care provider. Make sure you discuss any questions you have with your health care provider. °Document Revised: 03/02/2021 Document Reviewed: 07/19/2019 °Elsevier Patient Education © 2022 Elsevier Inc. ° °

## 2021-06-29 NOTE — Progress Notes (Signed)
  [facility]     HPI [name] is a 30 y.o.female presents for depression , anxiety and grief. PcP has not seen patient in a year. The last visit she was told by her father he had terminal cancer. They were very close and he lived in [place] talked every day and she went to spend time with him past this January. She has been admitted to behavioral health. Still grieving and heart broken and difficult to move forward. Recently broke up with her children fathers. All she wants is to be able to get her GED. 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 We talked and thought about a solution- since you are out when the children why don't you look at taking classes while the children are out Christmas. She will check to see if classes will be offered during that time.   Past Medical History:  Diagnosis Date   Herpes genitalia    Migraines    Trichimoniasis    Yeast infection      No Known Allergies    Current Outpatient Medications on File Prior to Visit  Medication Sig Dispense Refill   Etonogestrel (IMPLANON ) Inject 1 application into the skin once.     ondansetron (ZOFRAN) 4 MG tablet Take 1 tablet (4 mg total) by mouth every 8 (eight) hours as needed for up to 8 doses for nausea or vomiting. 8 tablet 0   No current facility-administered medications on file prior to visit.    ROS: all negative except above.   Physical Exam: General Appearance: Well nourished, in no apparent distress. Eyes: PERRLA, EOMs, conjunctiva no swelling or erythema Sinuses: No Frontal/maxillary tenderness ENT/Mouth: Ext aud canals clear, TMs without erythema, bulging. Hearing normal.  Neck: Supple, thyroid normal.  Respiratory: Respiratory effort normal, BS equal bilaterally without rales, rhonchi, wheezing or stridor.  Cardio: RRR with no MRGs. Brisk peripheral pulses without  edema.  Abdomen: Soft, + BS.  Non tender, no guarding, rebound, hernias, masses. Lymphatics: Non tender without lymphadenopathy.  Musculoskeletal: Full ROM, 5/5 strength, normal gait.  Skin: Warm, dry without rashes, lesions, ecchymosis.  Neuro: Cranial nerves intact. Normal muscle tone, no cerebellar symptoms. Sensation intact.  Psych: Awake and oriented X 3, normal affect, Insight and Judgment appropriate. .  Diagnoses and all orders for this visit:  Severe episode of recurrent major depressive disorder, without psychotic features (HCC) See HPI the 10th of the month she has emotional set back  Other orders -     citalopram (CELEXA) 20 MG tablet; Take 1 tablet (20 mg total) by mouth daily.    [name], NP 7:39 PM

## 2021-07-25 ENCOUNTER — Ambulatory Visit (INDEPENDENT_AMBULATORY_CARE_PROVIDER_SITE_OTHER): Payer: Medicaid Other | Admitting: Primary Care

## 2021-08-11 ENCOUNTER — Ambulatory Visit (INDEPENDENT_AMBULATORY_CARE_PROVIDER_SITE_OTHER): Payer: Medicaid Other | Admitting: Primary Care

## 2022-03-09 ENCOUNTER — Telehealth: Payer: Self-pay | Admitting: Family Medicine

## 2022-03-09 ENCOUNTER — Ambulatory Visit (INDEPENDENT_AMBULATORY_CARE_PROVIDER_SITE_OTHER): Payer: Self-pay | Admitting: *Deleted

## 2022-03-09 DIAGNOSIS — R3989 Other symptoms and signs involving the genitourinary system: Secondary | ICD-10-CM

## 2022-03-09 MED ORDER — FLUCONAZOLE 150 MG PO TABS: 150.0000 mg | ORAL_TABLET | Freq: Once | ORAL | 0 refills | Status: AC

## 2022-03-09 MED ORDER — NITROFURANTOIN MONOHYD MACRO 100 MG PO CAPS: 100.0000 mg | ORAL_CAPSULE | Freq: Two times a day (BID) | ORAL | 0 refills | Status: AC

## 2022-03-09 NOTE — Telephone Encounter (Signed)
Summary: burning while urinating   Patient experiencing burning while urinating. PCP has no available appointments until 04/04/2022. Patient seeking clinical advice      Reason for Disposition  All other patients with painful urination  (Exception: [1] EITHER frequency or urgency AND [2] has on-call doctor.)  Answer Assessment - Initial Assessment Questions 1. SEVERITY: "How bad is the pain?"  (e.g., Scale 1-10; mild, moderate, or severe)   - MILD (1-3): complains slightly about urination hurting   - MODERATE (4-7): interferes with normal activities     - SEVERE (8-10): excruciating, unwilling or unable to urinate because of the pain      moderate 2. FREQUENCY: "How many times have you had painful urination today?"      Every time urinates- urgency also 3. PATTERN: "Is pain present every time you urinate or just sometimes?"      Every time 4. ONSET: "When did the painful urination start?"      2-3 days 5. FEVER: "Do you have a fever?" If Yes, ask: "What is your temperature, how was it measured, and when did it start?"     no 6. PAST UTI: "Have you had a urine infection before?" If Yes, ask: "When was the last time?" and "What happened that time?"      Yes- over 5 years- antibiotic 7. CAUSE: "What do you think is causing the painful urination?"  (e.g., UTI, scratch, Herpes sore)     UTI 8. OTHER SYMPTOMS: "Do you have any other symptoms?" (e.g., blood in urine, flank pain, genital sores, urgency, vaginal discharge)     urgency 9. PREGNANCY: "Is there any chance you are pregnant?" "When was your last menstrual period?"     No, birthcontrol use- LMP- 1 week ago  Protocols used: Urination Pain - Female-A-AH

## 2022-03-09 NOTE — Telephone Encounter (Signed)
  Chief Complaint: UTI Symptoms: pain with urination, urgency Frequency: 2-3 days Pertinent Negatives: Patient denies blood in urine, flank pain, genital sores, vaginal discharge Disposition: [] ED /[] Urgent Care (no appt availability in office) / [] Appointment(In office/virtual)/ [x]  [facility]/ [] Home Care/ [] Refused Recommended Disposition /[]  Mobile Bus/ []  Follow-up with PCP Additional Notes:

## 2022-03-09 NOTE — Progress Notes (Signed)
Virtual Visit Consent   Jeanne Chaney, you are scheduled for a virtual visit with a [facility] provider today. Just as with appointments in the office, your consent must be obtained to participate. Your consent will be active for this visit and any virtual visit you may have with one of our providers in the next 365 days. If you have a MyChart account, a copy of this consent can be sent to you electronically.  As this is a virtual visit, video technology does not allow for your provider to perform a traditional examination. This may limit your provider's ability to fully assess your condition. If your provider identifies any concerns that need to be evaluated in person or the need to arrange testing (such as labs, EKG, etc.), we will make arrangements to do so. Although advances in technology are sophisticated, we cannot ensure that it will always work on either your end or our end. If the connection with a video visit is poor, the visit may have to be switched to a telephone visit. With either a video or telephone visit, we are not always able to ensure that we have a secure connection.  By engaging in this virtual visit, you consent to the provision of healthcare and authorize for your insurance to be billed (if applicable) for the services provided during this visit. Depending on your insurance coverage, you may receive a charge related to this service.  I need to obtain your verbal consent now. Are you willing to proceed with your visit today? Anh A Aguiniga has provided verbal consent on 03/09/2022 for a virtual visit (video or telephone). Freddy Finner, NP  Date: 03/09/2022 12:49 PM  Virtual Visit via Video Note   I, Freddy Finner, connected with  BREANN LOSANO  ([id number], 07/02/1991) on 03/09/22 at 12:45 PM EDT by a video-enabled telemedicine application and verified that I am speaking with the correct person using two identifiers.  Location: Patient: Virtual Visit Location Patient:  Home Provider: Virtual Visit Location Provider: Home Office   I discussed the limitations of evaluation and management by telemedicine and the availability of in person appointments. The patient expressed understanding and agreed to proceed.    History of Present Illness: Jeanne Chaney is a 31 y.o. who identifies as a female who was assigned female at birth, and is being seen today for burning with urination. Increased frequency and urgent last 2-3 days. Denies fevers and chill, n/v Denies blood in urine, flank pain, genital sores, or vaginal discharge. LMP- 1 week ago Not breastfeeding either  Problems:  Patient Active Problem List   Diagnosis Date Noted   Severe episode of recurrent major depressive disorder, without psychotic features (HCC) 03/30/2021   Vaginal irritation 04/30/2017   Herpes genitalis in women 10/06/2016   Pap smear for cervical cancer screening 10/06/2016    Allergies: No Known Allergies Medications:  Current Outpatient Medications:    citalopram (CELEXA) 20 MG tablet, Take 1 tablet (20 mg total) by mouth daily., Disp: 90 tablet, Rfl: 1   Etonogestrel (IMPLANON [place]), Inject 1 application into the skin once., Disp: , Rfl:    ondansetron (ZOFRAN) 4 MG tablet, Take 1 tablet (4 mg total) by mouth every 8 (eight) hours as needed for up to 8 doses for nausea or vomiting., Disp: 8 tablet, Rfl: 0  Observations/Objective: Patient is well-developed, well-nourished in no acute distress.  Resting comfortably  at home.  Head is normocephalic, atraumatic.  No labored breathing.  Speech is  clear and coherent with logical content.  Patient is alert and oriented at baseline.    Assessment and Plan: 1. Suspected UTI   - nitrofurantoin, macrocrystal-monohydrate, (MACROBID) 100 MG capsule; Take 1 capsule (100 mg total) by mouth 2 (two) times daily for 5 days.  Dispense: 10 capsule; Refill: 0 - fluconazole (DIFLUCAN) 150 MG tablet; Take 1 tablet (150 mg total) by mouth once  for 1 dose.  Dispense: 1 tablet; Refill: 0  -education on UTI provided -treatment as per above -hydration and complete full course of meds -no red flags- strict in person s&s reviewed   Reviewed side effects, risks and benefits of medication.    Patient acknowledged agreement and understanding of the plan.   Past Medical, Surgical, Social History, Allergies, and Medications have been Reviewed.   Follow Up Instructions: I discussed the assessment and treatment plan with the patient. The patient was provided an opportunity to ask questions and all were answered. The patient agreed with the plan and demonstrated an understanding of the instructions.  A copy of instructions were sent to the patient via MyChart unless otherwise noted below.     The patient was advised to call back or seek an in-person evaluation if the symptoms worsen or if the condition fails to improve as anticipated.  Time:  I spent 12 minutes with the patient via telehealth technology discussing the above problems/concerns.    Freddy Finner, NP

## 2022-03-09 NOTE — Patient Instructions (Signed)
Urinary Tract Infection, Adult A urinary tract infection (UTI) is an infection of any part of the urinary tract. The urinary tract includes: The kidneys. The ureters. The bladder. The urethra. These organs make, store, and get rid of pee (urine) in the body. What are the causes? This infection is caused by germs (bacteria) in your genital area. These germs grow and cause swelling (inflammation) of your urinary tract. What increases the risk? The following factors may make you more likely to develop this condition: Using a small, thin tube (catheter) to drain pee. Not being able to control when you pee or poop (incontinence). Being female. If you are female, these things can increase the risk: Using these methods to prevent pregnancy: A medicine that kills sperm (spermicide). A device that blocks sperm (diaphragm). Having low levels of a female hormone (estrogen). Being pregnant. You are more likely to develop this condition if: You have genes that add to your risk. You are sexually active. You take antibiotic medicines. You have trouble peeing because of: A prostate that is bigger than normal, if you are female. A blockage in the part of your body that drains pee from the bladder. A kidney stone. A nerve condition that affects your bladder. Not getting enough to drink. Not peeing often enough. You have other conditions, such as: Diabetes. A weak disease-fighting system (immune system). Sickle cell disease. Gout. Injury of the spine. What are the signs or symptoms? Symptoms of this condition include: Needing to pee right away. Peeing small amounts often. Pain or burning when peeing. Blood in the pee. Pee that smells bad or not like normal. Trouble peeing. Pee that is cloudy. Fluid coming from the vagina, if you are female. Pain in the belly or lower back. Other symptoms include: Vomiting. Not feeling hungry. Feeling mixed up (confused). This may be the first symptom in  older adults. Being tired and grouchy (irritable). A fever. Watery poop (diarrhea). How is this treated? Taking antibiotic medicine. Taking other medicines. Drinking enough water. In some cases, you may need to see a specialist. Follow these instructions at home:  Medicines Take over-the-counter and prescription medicines only as told by your doctor. If you were prescribed an antibiotic medicine, take it as told by your doctor. Do not stop taking it even if you start to feel better. General instructions Make sure you: Pee until your bladder is empty. Do not hold pee for a long time. Empty your bladder after sex. Wipe from front to back after peeing or pooping if you are a female. Use each tissue one time when you wipe. Drink enough fluid to keep your pee pale yellow. Keep all follow-up visits. Contact a doctor if: You do not get better after 1-2 days. Your symptoms go away and then come back. Get help right away if: You have very bad back pain. You have very bad pain in your lower belly. You have a fever. You have chills. You feeling like you will vomit or you vomit. Summary A urinary tract infection (UTI) is an infection of any part of the urinary tract. This condition is caused by germs in your genital area. There are many risk factors for a UTI. Treatment includes antibiotic medicines. Drink enough fluid to keep your pee pale yellow. This information is not intended to replace advice given to you by your health care provider. Make sure you discuss any questions you have with your health care provider. Document Revised: 03/19/2020 Document Reviewed: 03/19/2020 Elsevier Patient Education    2023 Elsevier Inc.  

## 2022-03-28 ENCOUNTER — Ambulatory Visit (INDEPENDENT_AMBULATORY_CARE_PROVIDER_SITE_OTHER): Payer: Self-pay | Admitting: *Deleted

## 2022-03-28 ENCOUNTER — Ambulatory Visit (INDEPENDENT_AMBULATORY_CARE_PROVIDER_SITE_OTHER): Payer: Self-pay | Admitting: Physician Assistant

## 2022-03-28 ENCOUNTER — Encounter: Payer: Self-pay | Admitting: Physician Assistant

## 2022-03-28 VITALS — BP 122/73 | HR 72 | Resp 18 | Ht 66.0 in | Wt 177.0 lb

## 2022-03-28 DIAGNOSIS — F332 Major depressive disorder, recurrent severe without psychotic features: Secondary | ICD-10-CM

## 2022-03-28 DIAGNOSIS — R399 Unspecified symptoms and signs involving the genitourinary system: Secondary | ICD-10-CM

## 2022-03-28 DIAGNOSIS — F5104 Psychophysiologic insomnia: Secondary | ICD-10-CM

## 2022-03-28 DIAGNOSIS — N3000 Acute cystitis without hematuria: Secondary | ICD-10-CM

## 2022-03-28 DIAGNOSIS — F411 Generalized anxiety disorder: Secondary | ICD-10-CM

## 2022-03-28 LAB — POCT URINALYSIS DIP (CLINITEK)
Bilirubin, UA: NEGATIVE
Glucose, UA: NEGATIVE mg/dL
Ketones, POC UA: NEGATIVE mg/dL
Nitrite, UA: NEGATIVE
POC PROTEIN,UA: NEGATIVE
Spec Grav, UA: >=1.03 — AB (ref 1.010–1.025)
Urobilinogen, UA: 0.2 E.U./dL
pH, UA: 6 (ref 5.0–8.0)

## 2022-03-28 MED ORDER — CITALOPRAM HYDROBROMIDE 20 MG PO TABS: 20.0000 mg | ORAL_TABLET | Freq: Every day | ORAL | 1 refills | Status: DC

## 2022-03-28 MED ORDER — HYDROXYZINE HCL 25 MG PO TABS: 25.0000 mg | ORAL_TABLET | Freq: Three times a day (TID) | ORAL | 1 refills | Status: DC | PRN

## 2022-03-28 NOTE — Telephone Encounter (Signed)
Reason for Disposition  [1] Taking antibiotic > 72 hours (3 days) for UTI AND [2] painful urination or frequency is SAME (unchanged, not better)  Answer Assessment - Initial Assessment Questions 1. MAIN SYMPTOM: "What is the main symptom you are concerned about?" (e.g., painful urination, urine frequency)     Took antibiotics after t   Today is day 5 on antibiotics.    I have 1 pill left.    2. BETTER-SAME-WORSE: "Are you getting better, staying the same, or getting worse compared to how you felt at your last visit to the doctor (most recent medical visit)?"     I'm not better.   Urgency, burning, chills.    3. PAIN: "How bad is the pain?"  (e.g., Scale 1-10; mild, moderate, or severe)   - MILD (1-3): complains slightly about urination hurting   - MODERATE (4-7): interferes with normal activities     - SEVERE (8-10): excruciating, unwilling or unable to urinate because of the pain      Burning after urinating 4. FEVER: "Do you have a fever?" If Yes, ask: "What is it, how was it measured, and when did it start?"     No 5. OTHER SYMPTOMS: "Do you have any other symptoms?" (e.g., blood in the urine, flank pain, vaginal discharge)     No flank pain.     I've been drinking a lot of soda.   No blood in urine. 6. DIAGNOSIS: "When was the UTI diagnosed?" "By whom?" "Was it a kidney infection, bladder infection or both?"     A week ago 7. ANTIBIOTIC: "What antibiotic(s) are you taking?" "How many times per day?"     *No Answer* 8. ANTIBIOTIC - START DATE: "When did you start taking the antibiotic?"     I'm on day 5 of my antibiotics.  Protocols used: Urinary Tract Infection on Antibiotic Follow-up Call - [facility]

## 2022-03-28 NOTE — Progress Notes (Unsigned)
   Established Patient Office Visit  Subjective   Patient ID: ZANDREA KENEALY, female    DOB: 02-12-1991  Age: 31 y.o. MRN: [id number]  No chief complaint on file.   States that she was treated for urinary tract infection after having a virtual visit on July 20th  States that her symptoms began a week prior buringin after Feel like have to pee more than normal  Started taking 5 days ago and still will have buring on and off  Sleep - 3-4 hours - has melatonin     {History (Optional):23778}  ROS    Objective:     There were no vitals taken for this visit. {Vitals History (Optional):23777}  Physical Exam   No results found for any visits on 03/28/22.  {Labs (Optional):23779}  The ASCVD Risk score (Arnett DK, et al., 2019) failed to calculate for the following reasons:   The 2019 ASCVD risk score is only valid for ages 51 to 93    Assessment & Plan:   Problem List Items Addressed This Visit   None Visit Diagnoses     UTI symptoms    -  Primary   Relevant Orders   POCT URINALYSIS DIP (CLINITEK)       No follow-ups on file.    Kasandra Knudsen Mayers, PA-C

## 2022-03-28 NOTE — Telephone Encounter (Signed)
  Chief Complaint: UTI symptoms continue after 5 days on antibiotic Symptoms: urgency, burning Frequency: Not having frequency Pertinent Negatives: Patient denies blood in urine or fever Disposition: [] ED /[] Urgent Care (no appt availability in office) / [] Appointment(In office/virtual)/ []  [facility] [place] [facility]/ [] Home Care/ [] Refused Recommended Disposition /[x] [facility]/ []  Follow-up with PCP Additional Notes: Appt made with , PA at [facility] at [facility] since [facility] is not running and [facility] does not have appts.

## 2022-03-28 NOTE — Progress Notes (Unsigned)
uitur

## 2022-03-28 NOTE — Patient Instructions (Signed)
I encourage you to take the Diflucan today and continue the last dose of Macrobid.  We will call you with the result of your urine culture.  You are going to start the Celexa on a daily basis, you can use the hydroxyzine 25 mg at bedtime to help you with sleep, you can use 1/2 tablet during the day if needed to help you with anxiety.  Please let us know if there is anything else we can do for you.  Roney Jaffe, PA-C Physician Assistant [facility] [email or web address]   Insomnia Insomnia is a sleep disorder that makes it difficult to fall asleep or stay asleep. Insomnia can cause fatigue, low energy, difficulty concentrating, mood swings, and poor performance at work or school. There are three different ways to classify insomnia: Difficulty falling asleep. Difficulty staying asleep. Waking up too early in the morning. Any type of insomnia can be long-term (chronic) or short-term (acute). Both are common. Short-term insomnia usually lasts for 3 months or less. Chronic insomnia occurs at least three times a week for longer than 3 months. What are the causes? Insomnia may be caused by another condition, situation, or substance, such as: Having certain mental health conditions, such as anxiety and depression. Using caffeine, alcohol, tobacco, or drugs. Having gastrointestinal conditions, such as gastroesophageal reflux disease (GERD). Having certain medical conditions. These include: Asthma. Alzheimer's disease. Stroke. Chronic pain. An overactive thyroid gland (hyperthyroidism). Other sleep disorders, such as restless legs syndrome and sleep apnea. Menopause. Sometimes, the cause of insomnia may not be known. What increases the risk? Risk factors for insomnia include: Gender. Females are affected more often than males. Age. Insomnia is more common as people get older. Stress and certain medical and mental health  conditions. Lack of exercise. Having an irregular work schedule. This may include working night shifts and traveling between different time zones. What are the signs or symptoms? If you have insomnia, the main symptom is having trouble falling asleep or having trouble staying asleep. This may lead to other symptoms, such as: Feeling tired or having low energy. Feeling nervous about going to sleep. Not feeling rested in the morning. Having trouble concentrating. Feeling irritable, anxious, or depressed. How is this diagnosed? This condition may be diagnosed based on: Your symptoms and medical history. Your health care provider may ask about: Your sleep habits. Any medical conditions you have. Your mental health. A physical exam. How is this treated? Treatment for insomnia depends on the cause. Treatment may focus on treating an underlying condition that is causing the insomnia. Treatment may also include: Medicines to help you sleep. Counseling or therapy. Lifestyle adjustments to help you sleep better. Follow these instructions at home: Eating and drinking  Limit or avoid alcohol, caffeinated beverages, and products that contain nicotine and tobacco, especially close to bedtime. These can disrupt your sleep. Do not eat a large meal or eat spicy foods right before bedtime. This can lead to digestive discomfort that can make it hard for you to sleep. Sleep habits  Keep a sleep diary to help you and your health care provider figure out what could be causing your insomnia. Write down: When you sleep. When you wake up during the night. How well you sleep and how rested you feel the next day. Any side effects of medicines you are taking. What you eat and drink. Make your bedroom a dark, comfortable place where it is easy to fall asleep. Put up shades or blackout curtains to block  light from outside. Use a white noise machine to block noise. Keep the temperature cool. Limit screen use  before bedtime. This includes: Not watching TV. Not using your smartphone, tablet, or computer. Stick to a routine that includes going to bed and waking up at the same times every day and night. This can help you fall asleep faster. Consider making a quiet activity, such as reading, part of your nighttime routine. Try to avoid taking naps during the day so that you sleep better at night. Get out of bed if you are still awake after 15 minutes of trying to sleep. Keep the lights down, but try reading or doing a quiet activity. When you feel sleepy, go back to bed. General instructions Take over-the-counter and prescription medicines only as told by your health care provider. Exercise regularly as told by your health care provider. However, avoid exercising in the hours right before bedtime. Use relaxation techniques to manage stress. Ask your health care provider to suggest some techniques that may work well for you. These may include: Breathing exercises. Routines to release muscle tension. Visualizing peaceful scenes. Make sure that you drive carefully. Do not drive if you feel very sleepy. Keep all follow-up visits. This is important. Contact a health care provider if: You are tired throughout the day. You have trouble in your daily routine due to sleepiness. You continue to have sleep problems, or your sleep problems get worse. Get help right away if: You have thoughts about hurting yourself or someone else. Get help right away if you feel like you may hurt yourself or others, or have thoughts about taking your own life. Go to your nearest emergency room or: Call 911. Call the National Suicide Prevention Lifeline at 712-851-1691 or 988. This is open 24 hours a day. Text the Crisis Text Line at (228)337-7875. Summary Insomnia is a sleep disorder that makes it difficult to fall asleep or stay asleep. Insomnia can be long-term (chronic) or short-term (acute). Treatment for insomnia depends on  the cause. Treatment may focus on treating an underlying condition that is causing the insomnia. Keep a sleep diary to help you and your health care provider figure out what could be causing your insomnia. This information is not intended to replace advice given to you by your health care provider. Make sure you discuss any questions you have with your health care provider. Document Revised: 07/18/2021 Document Reviewed: 07/18/2021 Elsevier Patient Education  2023 [employer].

## 2022-03-29 ENCOUNTER — Encounter: Payer: Self-pay | Admitting: Physician Assistant

## 2022-03-30 LAB — URINE CULTURE

## 2022-03-31 ENCOUNTER — Telehealth (INDEPENDENT_AMBULATORY_CARE_PROVIDER_SITE_OTHER): Payer: Self-pay | Admitting: Primary Care

## 2022-03-31 NOTE — Telephone Encounter (Signed)
Advised patient that per note documented mobile team RN states that patient was still symptomatic and wanted to follow up. Patient states she was told that another antibiotic would be sent. Advised patient to follow up with mobile team on Monday.

## 2022-03-31 NOTE — Telephone Encounter (Signed)
Pt received her lab results and was advised by Cari that a different antibiotic would be sent to her pharmacy but nothing was sent / please advise    [id number] - [place] ([place]), [place] - [address]  [address] [place] ([place]) [place] [address]  Phone:  [phone]  Fax:  [phone]

## 2022-05-01 ENCOUNTER — Telehealth: Payer: Self-pay

## 2022-05-01 ENCOUNTER — Ambulatory Visit (INDEPENDENT_AMBULATORY_CARE_PROVIDER_SITE_OTHER): Payer: Self-pay | Admitting: Primary Care

## 2022-05-01 ENCOUNTER — Encounter (INDEPENDENT_AMBULATORY_CARE_PROVIDER_SITE_OTHER): Payer: Self-pay | Admitting: Primary Care

## 2022-05-01 VITALS — BP 114/68 | HR 84 | Resp 16 | Wt 177.2 lb

## 2022-05-01 DIAGNOSIS — F332 Major depressive disorder, recurrent severe without psychotic features: Secondary | ICD-10-CM

## 2022-05-01 DIAGNOSIS — F411 Generalized anxiety disorder: Secondary | ICD-10-CM

## 2022-05-01 DIAGNOSIS — F5104 Psychophysiologic insomnia: Secondary | ICD-10-CM

## 2022-05-01 MED ORDER — CITALOPRAM HYDROBROMIDE 20 MG PO TABS: 20.0000 mg | ORAL_TABLET | Freq: Every day | ORAL | 1 refills | Status: DC

## 2022-05-01 MED ORDER — HYDROXYZINE HCL 25 MG PO TABS: 25.0000 mg | ORAL_TABLET | Freq: Three times a day (TID) | ORAL | 1 refills | Status: DC | PRN

## 2022-05-01 NOTE — Telephone Encounter (Signed)
I spoke with the patient via phone while she was at her appointment at [facility] today.  Gwinda Passe, NP was with her during the call and explained that the patient is very anxious trying to cope with multiple stressors at home and still work 2 jobs. Her 2 young children live with her.   I provided her with the contact numbers for [facility] and [facility] the [place] as well as the number for [facility] Adult Ed department as she is interested on obtaining her GED at some point.    I instructed her to call  me if she has further questions/concerns about resources.
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
## 2022-05-05 ENCOUNTER — Other Ambulatory Visit: Payer: Self-pay

## 2022-05-05 ENCOUNTER — Telehealth (INDEPENDENT_AMBULATORY_CARE_PROVIDER_SITE_OTHER): Payer: Self-pay | Admitting: Primary Care

## 2022-05-05 ENCOUNTER — Other Ambulatory Visit (INDEPENDENT_AMBULATORY_CARE_PROVIDER_SITE_OTHER): Payer: Self-pay | Admitting: Primary Care

## 2022-05-05 DIAGNOSIS — F332 Major depressive disorder, recurrent severe without psychotic features: Secondary | ICD-10-CM

## 2022-05-05 DIAGNOSIS — F411 Generalized anxiety disorder: Secondary | ICD-10-CM

## 2022-05-05 MED ORDER — CITALOPRAM HYDROBROMIDE 20 MG PO TABS
20.0000 mg | ORAL_TABLET | Freq: Every day | ORAL | 1 refills | Status: DC
  Filled 2022-05-05: qty 90, 90d supply, fill #0

## 2022-05-05 MED ORDER — HYDROXYZINE HCL 25 MG PO TABS
25.0000 mg | ORAL_TABLET | Freq: Three times a day (TID) | ORAL | 1 refills | Status: DC | PRN
  Filled 2022-05-05: qty 90, 30d supply, fill #0

## 2022-05-05 NOTE — Telephone Encounter (Signed)
Will forward to provider  

## 2022-05-05 NOTE — Telephone Encounter (Signed)
Patient called in about med, citalopram (CELEXA) 20 MG  tablet, she states Dr Randa Evens told her she can get this from [facility] and family resource center, but it was sent on 09/11 [id number] - [place] ([place]), [place] - [address]. Please all back if she can get this from the family resource center

## 2022-05-08 ENCOUNTER — Other Ambulatory Visit: Payer: Self-pay

## 2022-05-08 NOTE — Telephone Encounter (Signed)
I returned the call to the patient and she said that the medication was to go to [facility]. It was not the [facility].  She said she was able to get the med transferred and she has picked it up.

## 2022-05-15 ENCOUNTER — Other Ambulatory Visit: Payer: Self-pay

## 2022-06-13 ENCOUNTER — Ambulatory Visit (INDEPENDENT_AMBULATORY_CARE_PROVIDER_SITE_OTHER): Payer: Self-pay | Admitting: Primary Care

## 2022-07-04 ENCOUNTER — Ambulatory Visit (INDEPENDENT_AMBULATORY_CARE_PROVIDER_SITE_OTHER): Payer: Medicaid Other | Admitting: Primary Care

## 2022-08-23 ENCOUNTER — Ambulatory Visit (HOSPITAL_COMMUNITY)
Admission: EM | Admit: 2022-08-23 | Discharge: 2022-08-23 | Disposition: A | Discharge status: Home / Self Care | Payer: Medicaid Other | Attending: Physician Assistant | Admitting: Physician Assistant

## 2022-08-23 ENCOUNTER — Encounter (HOSPITAL_COMMUNITY): Payer: Self-pay | Admitting: Emergency Medicine

## 2022-08-23 DIAGNOSIS — J069 Acute upper respiratory infection, unspecified: Secondary | ICD-10-CM | POA: Diagnosis not present

## 2022-08-23 DIAGNOSIS — Z79899 Other long term (current) drug therapy: Secondary | ICD-10-CM | POA: Insufficient documentation

## 2022-08-23 DIAGNOSIS — R051 Acute cough: Secondary | ICD-10-CM | POA: Diagnosis present

## 2022-08-23 DIAGNOSIS — U071 COVID-19: Secondary | ICD-10-CM | POA: Diagnosis not present

## 2022-08-23 MED ORDER — FLUTICASONE PROPIONATE 50 MCG/ACT NA SUSP: 1.0000 | Freq: Every day | NASAL | 0 refills | Status: DC

## 2022-08-23 MED ORDER — PROMETHAZINE-DM 6.25-15 MG/5ML PO SYRP: 5.0000 mL | ORAL_SOLUTION | Freq: Four times a day (QID) | ORAL | 0 refills | Status: DC | PRN

## 2022-08-23 MED ORDER — KETOROLAC TROMETHAMINE 30 MG/ML IJ SOLN
INTRAMUSCULAR | Status: AC
  Filled 2022-08-23: qty 1

## 2022-08-23 MED ORDER — KETOROLAC TROMETHAMINE 30 MG/ML IJ SOLN
30.0000 mg | Freq: Once | INTRAMUSCULAR | Status: AC
  Administered 2022-08-23: 30 mg via INTRAMUSCULAR

## 2022-08-23 NOTE — Discharge Instructions (Signed)
Monitor your MyChart for your COVID results.  We will contact you if you are positive.  Use Promethazine DM for cough.  This will make you sleepy so do not drive or drink alcohol while taking it.  Use Flonase for congestion.  We have given you an injection of Toradol today.  Please do not take any NSAIDs including aspirin, ibuprofen/Advil, naproxen/Aleve for a minimum of 12 hours.  You can use acetaminophen/Tylenol as needed.  Make sure that you are resting and drinking plenty of fluid.  If your symptoms do not improving within a week please return for reevaluation.  If anything worsens and you have worsening cough, shortness of breath, chest pain, nausea/vomiting interfere with oral intake, weakness you need to be seen immediately.

## 2022-08-23 NOTE — ED Provider Notes (Signed)
[place]    CSN: [id number] Arrival date & time: 08/23/22  1017      History   Chief Complaint Chief Complaint  Patient presents with   Headache   Cough    HPI Almyra A Engebretsen is a 32 y.o. female.   Patient presents today with a 2 to 3-day history of URI symptoms including headache, cough, congestion, change in her sense of taste and smell, headache.  Denies any chest pain, shortness of breath, vomiting, diarrhea.  Denies any known sick contacts but does work at [employer] where she is exposed to many people.  She has not had COVID in the past.  She has had COVID vaccines but has not had most recent influenza vaccination.  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  She denies any significant past medical history including allergies, asthma, COPD, smoking.  Denies any history of diabetes or immunosuppression.  She is confident that she is not pregnant.  Denies any recent antibiotics or steroids.    Past Medical History:  Diagnosis Date   Herpes genitalia    Migraines    Trichimoniasis    Yeast infection     Patient Active Problem List   Diagnosis Date Noted   Severe episode of recurrent major depressive disorder, without psychotic features ([place]) 03/30/2021   Vaginal irritation 04/30/2017   Herpes genitalis in women 10/06/2016   Pap smear for cervical cancer screening 10/06/2016    History reviewed. No pertinent surgical history.  OB History     Gravida  2   Para  2   Term  2   Preterm      AB      Living  2      SAB      IAB      Ectopic      Multiple      Live Births  2            Home Medications    Prior to Admission medications   Medication Sig Start Date End Date Taking? Authorizing Provider  fluticasone (FLONASE) 50 MCG/ACT nasal spray Place 1 spray into both nostrils daily. 08/23/22  Yes Ece Cumberland K, PA-C  promethazine-dextromethorphan (PROMETHAZINE-DM) 6.25-15 MG/5ML syrup Take 5 mLs by  mouth 4 (four) times daily as needed for cough. 08/23/22  Yes Terrie Grajales K, PA-C  Etonogestrel (IMPLANON [place]) Inject 1 application into the skin once. Patient not taking: Reported on 05/01/2022    [provider]  ondansetron (ZOFRAN) 4 MG tablet Take 1 tablet (4 mg total) by mouth every 8 (eight) hours as needed for up to 8 doses for nausea or vomiting. Patient not taking: Reported on 05/01/2022 04/03/21   Briant Cedar, MD    Family History Family History  Problem Relation Age of Onset   Asthma Mother     Social History Social History   Tobacco Use   Smoking status: Never   Smokeless tobacco: Never  Vaping Use   Vaping Use: Never used  Substance Use Topics   Alcohol use: No   Drug use: Yes    Types: Marijuana     Allergies   Patient has no known allergies.   Review of Systems Review of Systems  Constitutional:  Positive for activity change and fatigue. Negative for appetite change and fever.  HENT:  Positive for congestion and sore throat. Negative for sinus pressure and sneezing.   Respiratory:  Positive for cough. Negative for shortness  of breath.   Cardiovascular:  Negative for chest pain.  Gastrointestinal:  Positive for nausea. Negative for abdominal pain, diarrhea and vomiting.  Musculoskeletal:  Negative for arthralgias and myalgias.  Neurological:  Positive for headaches. Negative for dizziness and light-headedness.     Physical Exam Triage Vital Signs ED Triage Vitals  Enc Vitals Group     BP 08/23/22 0837 126/83     Pulse Rate 08/23/22 0837 65     Resp 08/23/22 0837 16     Temp 08/23/22 0837 98.8 F (37.1 C)     Temp Source 08/23/22 0837 Oral     SpO2 08/23/22 0837 100 %     Weight --      Height --      Head Circumference --      Peak Flow --      Pain Score 08/23/22 0836 10     Pain Loc --      Pain Edu? --      Excl. in [place]? --    No data found.  Updated Vital Signs BP 126/83 (BP Location: Left Arm)   Pulse 65   Temp 98.8  F (37.1 C) (Oral)   Resp 16   LMP 07/30/2022   SpO2 100%   Visual Acuity Right Eye Distance:   Left Eye Distance:   Bilateral Distance:    Right Eye Near:   Left Eye Near:    Bilateral Near:     Physical Exam Vitals reviewed.  Constitutional:      General: She is awake. She is not in acute distress.    Appearance: Normal appearance. She is well-developed. She is not ill-appearing.     Comments: Very pleasant female appears stated age in no acute distress sitting comfortably in exam room  HENT:     Head: Normocephalic and atraumatic.     Right Ear: Tympanic membrane, ear canal and external ear normal. Tympanic membrane is not erythematous or bulging.     Left Ear: Tympanic membrane, ear canal and external ear normal. Tympanic membrane is not erythematous or bulging.     Nose: Congestion present.     Right Sinus: No maxillary sinus tenderness or frontal sinus tenderness.     Left Sinus: No maxillary sinus tenderness or frontal sinus tenderness.     Mouth/Throat:     Pharynx: Uvula midline. Posterior oropharyngeal erythema present. No oropharyngeal exudate.     Comments: Erythema and drainage in posterior oropharynx Cardiovascular:     Rate and Rhythm: Normal rate and regular rhythm.     Heart sounds: Normal heart sounds, S1 normal and S2 normal. No murmur heard. Pulmonary:     Effort: Pulmonary effort is normal.     Breath sounds: Normal breath sounds. No wheezing, rhonchi or rales.     Comments: Clear to auscultation bilaterally Psychiatric:        Behavior: Behavior is cooperative.      UC Treatments / Results  Labs (all labs ordered are listed, but only abnormal results are displayed) Labs Reviewed  SARS CORONAVIRUS 2 (TAT 6-24 HRS)    EKG   Radiology No results found.  Procedures Procedures (including critical care time)  Medications Ordered in UC Medications  ketorolac (TORADOL) 30 MG/ML injection 30 mg (30 mg Intramuscular Given 08/23/22 0906)     Initial Impression / Assessment and Plan / UC Course  I have reviewed the triage vital signs and the nursing notes.  Pertinent labs & imaging results that were available  during my care of the patient were reviewed by me and considered in my medical decision making (see chart for details).     Patient is well-appearing, afebrile, nontoxic, nontachycardic.  No evidence of acute infection on physical exam that warrant initiation of antibiotics.  Flu testing was deferred as she has been symptomatic for 3 days and outside the window of effectiveness for Tamiflu.  COVID testing was obtained and is pending.  She is not a candidate for antiviral therapy as she is young and otherwise healthy.  She was given Promethazine DM for cough.  Discussed that this can be sedating and she is not to drive or drink alcohol with taking it.  Recommended over-the-counter medications including Tylenol, Flonase, Mucinex for additional symptom relief.  She is to rest and drink plenty of fluid.  She was given Toradol injection in clinic with minimal improvement of symptoms.  Discussed that she is not to take NSAIDs within 12 hours of injection but can use acetaminophen/Tylenol for pain to which she expressed understanding.  Discussed that if her symptoms are not improving within a week she is to return for reevaluation.  If she has any worsening symptoms she needs to be seen immediately.  Strict return precautions given.  Work excuse note with current CDC return to work guidelines based on COVID test result provided during visit today.  Final Clinical Impressions(s) / UC Diagnoses   Final diagnoses:  Upper respiratory tract infection, unspecified type  Acute cough     Discharge Instructions      Monitor your MyChart for your COVID results.  We will contact you if you are positive.  Use Promethazine DM for cough.  This will make you sleepy so do not drive or drink alcohol while taking it.  Use Flonase for congestion.   We have given you an injection of Toradol today.  Please do not take any NSAIDs including aspirin, ibuprofen/Advil, naproxen/Aleve for a minimum of 12 hours.  You can use acetaminophen/Tylenol as needed.  Make sure that you are resting and drinking plenty of fluid.  If your symptoms do not improving within a week please return for reevaluation.  If anything worsens and you have worsening cough, shortness of breath, chest pain, nausea/vomiting interfere with oral intake, weakness you need to be seen immediately.     ED Prescriptions     Medication Sig Dispense Auth. Provider   promethazine-dextromethorphan (PROMETHAZINE-DM) 6.25-15 MG/5ML syrup Take 5 mLs by mouth 4 (four) times daily as needed for cough. 118 mL Kiera Hussey K, PA-C   fluticasone (FLONASE) 50 MCG/ACT nasal spray Place 1 spray into both nostrils daily. 16 g Prince Olivier K, PA-C      PDMP not reviewed this encounter.   Jeani Hawking, PA-C 08/23/22 8938

## 2022-08-23 NOTE — ED Triage Notes (Signed)
Pt reports for a couple of days had headaches, cough , congeststion, loss taste and smell. Took BC powder last night.

## 2022-08-24 LAB — SARS CORONAVIRUS 2 (TAT 6-24 HRS): SARS Coronavirus 2: POSITIVE — AB

## 2022-09-22 ENCOUNTER — Encounter (HOSPITAL_COMMUNITY): Payer: Self-pay

## 2022-09-22 ENCOUNTER — Other Ambulatory Visit: Payer: Self-pay

## 2022-09-22 ENCOUNTER — Emergency Department (HOSPITAL_COMMUNITY)
Admission: EM | Admit: 2022-09-22 | Discharge: 2022-09-22 | Disposition: A | Discharge status: Home / Self Care | Payer: Medicaid Other | Attending: Emergency Medicine | Admitting: Emergency Medicine

## 2022-09-22 DIAGNOSIS — Z20822 Contact with and (suspected) exposure to covid-19: Secondary | ICD-10-CM | POA: Insufficient documentation

## 2022-09-22 DIAGNOSIS — J029 Acute pharyngitis, unspecified: Secondary | ICD-10-CM | POA: Diagnosis present

## 2022-09-22 DIAGNOSIS — R0981 Nasal congestion: Secondary | ICD-10-CM | POA: Insufficient documentation

## 2022-09-22 DIAGNOSIS — R5381 Other malaise: Secondary | ICD-10-CM | POA: Diagnosis not present

## 2022-09-22 DIAGNOSIS — R0982 Postnasal drip: Secondary | ICD-10-CM | POA: Insufficient documentation

## 2022-09-22 LAB — GROUP A STREP BY PCR: Group A Strep by PCR: NOT DETECTED

## 2022-09-22 LAB — RESP PANEL BY RT-PCR (RSV, FLU A&B, COVID)  RVPGX2
Influenza A by PCR: NEGATIVE
Influenza B by PCR: NEGATIVE
Resp Syncytial Virus by PCR: NEGATIVE
SARS Coronavirus 2 by RT PCR: NEGATIVE

## 2022-09-22 MED ORDER — OXYMETAZOLINE HCL 0.05 % NA SOLN: 1.0000 | Freq: Two times a day (BID) | NASAL | 0 refills | Status: AC

## 2022-09-22 MED ORDER — DEXAMETHASONE SODIUM PHOSPHATE 10 MG/ML IJ SOLN
10.0000 mg | Freq: Once | INTRAMUSCULAR | Status: AC
  Administered 2022-09-22: 10 mg
  Filled 2022-09-22: qty 1

## 2022-09-22 MED ORDER — IBUPROFEN 600 MG PO TABS: 600.0000 mg | ORAL_TABLET | Freq: Four times a day (QID) | ORAL | 0 refills | Status: DC | PRN

## 2022-09-22 MED ORDER — LIDOCAINE VISCOUS HCL 2 % MT SOLN
15.0000 mL | Freq: Once | OROMUCOSAL | Status: AC
  Administered 2022-09-22: 15 mL via OROMUCOSAL
  Filled 2022-09-22: qty 15

## 2022-09-22 MED ORDER — ACETAMINOPHEN 325 MG PO TABS: 650.0000 mg | ORAL_TABLET | Freq: Four times a day (QID) | ORAL | 0 refills | Status: DC | PRN

## 2022-09-22 MED ORDER — MENTHOL 3 MG MT LOZG: 1.0000 | LOZENGE | OROMUCOSAL | 0 refills | Status: DC | PRN

## 2022-09-22 NOTE — ED Triage Notes (Signed)
Pt arrived POV from home c/o a sore throat, and a runny nose that started yesterday.

## 2022-09-22 NOTE — Discharge Instructions (Addendum)
It was a pleasure caring for you today in the emergency department.  Please return to the emergency department for any worsening or worrisome symptoms.  

## 2022-09-22 NOTE — ED Provider Notes (Signed)
[facility] Provider Note  CSN: [id number] Arrival date & time: 09/22/22 1829  Chief Complaint(s) Sore Throat and Nasal Congestion  HPI Jeanne Chaney is a 32 y.o. female with past medical history as below, significant for migraines, MDD, herpes genitalis who presents to the ED with complaint of URI symptoms x 1 day.  Patient reports postnasal drip, sore throat, malaise, sinus congestion.  Has not tried any medications at home.  No difficulty breathing.  Has some pain with swallowing but is able to tolerate secretions and liquids appropriately.  No stridor.  Does not have history of recurrent sore throat or tonsillitis.  No recent travel or sick contacts  Past Medical History Past Medical History:  Diagnosis Date   Herpes genitalia    Migraines    Trichimoniasis    Yeast infection    Patient Active Problem List   Diagnosis Date Noted   Severe episode of recurrent major depressive disorder, without psychotic features ([place]) 03/30/2021   Vaginal irritation 04/30/2017   Herpes genitalis in women 10/06/2016   Pap smear for cervical cancer screening 10/06/2016   Home Medication(s) Prior to Admission medications   Medication Sig Start Date End Date Taking? Authorizing Provider  acetaminophen (TYLENOL) 325 MG tablet Take 2 tablets (650 mg total) by mouth every 6 (six) hours as needed. 09/22/22  Yes Wynona Dove A, DO  ibuprofen (ADVIL) 600 MG tablet Take 1 tablet (600 mg total) by mouth every 6 (six) hours as needed. 09/22/22  Yes Jeanell Sparrow, DO  menthol-cetylpyridinium (CEPACOL) 3 MG lozenge Take 1 lozenge (3 mg total) by mouth as needed for sore throat. 09/22/22  Yes Jeanell Sparrow, DO  oxymetazoline (AFRIN NASAL SPRAY) 0.05 % nasal spray Place 1 spray into both nostrils 2 (two) times daily for 3 days. 09/22/22 09/25/22 Yes Jeanell Sparrow, DO  Etonogestrel (IMPLANON [place]) Inject 1 application into the skin once. Patient not taking: Reported on 05/01/2022     [provider]  fluticasone (FLONASE) 50 MCG/ACT nasal spray Place 1 spray into both nostrils daily. 08/23/22   Raspet, Erin K, PA-C  ondansetron (ZOFRAN) 4 MG tablet Take 1 tablet (4 mg total) by mouth every 8 (eight) hours as needed for up to 8 doses for nausea or vomiting. Patient not taking: Reported on 05/01/2022 04/03/21   Briant Cedar, MD  promethazine-dextromethorphan (PROMETHAZINE-DM) 6.25-15 MG/5ML syrup Take 5 mLs by mouth 4 (four) times daily as needed for cough. 08/23/22   Raspet, Derry Skill, PA-C                                                                                                                                    Past Surgical History History reviewed. No pertinent surgical history. Family History Family History  Problem Relation Age of Onset   Asthma Mother     Social History Social History   Tobacco  Use   Smoking status: Never   Smokeless tobacco: Never  Vaping Use   Vaping Use: Never used  Substance Use Topics   Alcohol use: No   Drug use: Yes    Types: Marijuana   Allergies Patient has no known allergies.  Review of Systems Review of Systems  Constitutional:  Negative for activity change and fever.  HENT:  Positive for congestion, postnasal drip, rhinorrhea and sore throat. Negative for facial swelling and trouble swallowing.   Eyes:  Negative for discharge and redness.  Respiratory:  Negative for cough and shortness of breath.   Cardiovascular:  Negative for chest pain and palpitations.  Gastrointestinal:  Negative for abdominal pain and nausea.  Genitourinary:  Negative for dysuria and flank pain.  Musculoskeletal:  Negative for back pain and gait problem.  Skin:  Negative for pallor and rash.  Neurological:  Negative for syncope and headaches.    Physical Exam Vital Signs  I have reviewed the triage vital signs BP 138/89   Pulse (!) 103   Temp 99.5 F (37.5 C) (Oral)   Resp 18   Ht 5\' 6"  (1.676 m)   Wt 72.6 kg   LMP  07/30/2022   SpO2 99%   BMI 25.82 kg/m  Physical Exam Vitals and nursing note reviewed.  Constitutional:      General: She is not in acute distress.    Appearance: Normal appearance.  HENT:     Head: Normocephalic and atraumatic. No right periorbital erythema or left periorbital erythema.     Jaw: There is normal jaw occlusion. No trismus or swelling.     Comments: No drooling stridor or trismus, uvula is midline    Right Ear: External ear normal.     Left Ear: External ear normal.     Nose: Nose normal.     Mouth/Throat:     Mouth: Mucous membranes are moist.     Pharynx: Uvula midline. Posterior oropharyngeal erythema present. No oropharyngeal exudate or uvula swelling.  Eyes:     General: No scleral icterus.       Right eye: No discharge.        Left eye: No discharge.     Conjunctiva/sclera: Conjunctivae normal.  Cardiovascular:     Rate and Rhythm: Normal rate and regular rhythm.     Pulses: Normal pulses.     Heart sounds: Normal heart sounds.  Pulmonary:     Effort: Pulmonary effort is normal. No respiratory distress.     Breath sounds: Normal breath sounds.  Abdominal:     General: Abdomen is flat.     Tenderness: There is no abdominal tenderness.  Musculoskeletal:        General: Normal range of motion.     Cervical back: Normal range of motion.     Right lower leg: No edema.     Left lower leg: No edema.  Skin:    General: Skin is warm and dry.     Capillary Refill: Capillary refill takes less than 2 seconds.  Neurological:     Mental Status: She is alert.  Psychiatric:        Mood and Affect: Mood normal.        Behavior: Behavior normal.     ED Results and Treatments Labs (all labs ordered are listed, but only abnormal results are displayed) Labs Reviewed  GROUP A STREP BY PCR  RESP PANEL BY RT-PCR (RSV, FLU A&B, COVID)  RVPGX2  Radiology No results found.  Pertinent labs & imaging results that were available during my care of the patient were reviewed by me and considered in my medical decision making (see MDM for details).  Medications Ordered in ED Medications  dexamethasone (DECADRON) injection 10 mg (10 mg Other Given 09/22/22 0925)  lidocaine (XYLOCAINE) 2 % viscous mouth solution 15 mL (15 mLs Mouth/Throat Given 09/22/22 0925)                                                                                                                                     Procedures Procedures  (including critical care time)  Medical Decision Making / ED Course   MDM:  Jeanne Chaney is a 32 y.o. female with past medical history as below, significant for migraines, MDD, herpes genitalis who presents to the ED with complaint of URI symptoms x 1 day.. The complaint involves an extensive differential diagnosis and also carries with it a high risk of complications and morbidity.  Serious etiology was considered. Ddx includes but is not limited to: Viral pharyngitis, bacterial pharyngitis, strep, flu, COVID, RSV, etc.  On initial assessment the patient is: Resting comfortably, no acute distress, no drooling stridor or trismus, phonation is appropriate Vital signs and nursing notes were reviewed    Strep was negative, RVP was negative.  Favor likely viral pharyngitis.  Discussed risk of steroids while pregnant, patient is adamant she is not pregnant does not want a pregnancy test.  Will give oral Decadron x 1, viscous lidocaine x 1.  Discussed supportive care at home.  Strict return precautions were also discussed  The patient improved significantly and was discharged in stable condition. Detailed discussions were had with the patient regarding current findings, and need for close f/u with PCP or on call doctor. The patient has been instructed to return immediately if the symptoms worsen in any way for re-evaluation. Patient  verbalized understanding and is in agreement with current care plan. All questions answered prior to discharge.    Additional history obtained: -Additional history obtained from spouse -External records from outside source obtained and reviewed including: Chart review including previous notes, labs, imaging, consultation notes including prior urgent care documentation, prior labs and imaging   Lab Tests: -I ordered, reviewed, and interpreted labs.   The pertinent results include:   Labs Reviewed  GROUP A STREP BY PCR  RESP PANEL BY RT-PCR (RSV, FLU A&B, COVID)  RVPGX2    Notable for as above  EKG   EKG Interpretation  Date/Time:    Ventricular Rate:    PR Interval:    QRS Duration:   QT Interval:    QTC Calculation:   R Axis:     Text Interpretation:           Imaging Studies ordered: I ordered imaging studies including not applicable   Medicines ordered and prescription drug management: Meds ordered this encounter  Medications   dexamethasone (DECADRON) injection 10 mg    po   oxymetazoline (AFRIN NASAL SPRAY) 0.05 % nasal spray    Sig: Place 1 spray into both nostrils 2 (two) times daily for 3 days.    Dispense:  15 mL    Refill:  0   menthol-cetylpyridinium (CEPACOL) 3 MG lozenge    Sig: Take 1 lozenge (3 mg total) by mouth as needed for sore throat.    Dispense:  45 tablet    Refill:  0   ibuprofen (ADVIL) 600 MG tablet    Sig: Take 1 tablet (600 mg total) by mouth every 6 (six) hours as needed.    Dispense:  30 tablet    Refill:  0   acetaminophen (TYLENOL) 325 MG tablet    Sig: Take 2 tablets (650 mg total) by mouth every 6 (six) hours as needed.    Dispense:  36 tablet    Refill:  0   lidocaine (XYLOCAINE) 2 % viscous mouth solution 15 mL    -I have reviewed the patients home medicines and have made adjustments as needed   Consultations Obtained: Not applicable  Cardiac Monitoring: Not applicable Social Determinants of Health:  Diagnosis  or treatment significantly limited by social determinants of health: Non-smoker   Reevaluation: After the interventions noted above, I reevaluated the patient and found that they have improved  Co morbidities that complicate the patient evaluation  Past Medical History:  Diagnosis Date   Herpes genitalia    Migraines    Trichimoniasis    Yeast infection       Dispostion: Disposition decision including need for hospitalization was considered, and patient discharged from emergency department.    Final Clinical Impression(s) / ED Diagnoses Final diagnoses:  Pharyngitis, unspecified etiology     This chart was dictated using voice recognition software.  Despite best efforts to proofread,  errors can occur which can change the documentation meaning.    Jeanell Sparrow, DO 09/22/22 [phone]

## 2023-01-28 ENCOUNTER — Encounter (HOSPITAL_COMMUNITY): Payer: Self-pay

## 2023-01-28 ENCOUNTER — Emergency Department (HOSPITAL_COMMUNITY)
Admission: EM | Admit: 2023-01-28 | Discharge: 2023-01-28 | Disposition: A | Discharge status: Home / Self Care | Payer: Medicaid Other | Attending: Emergency Medicine | Admitting: Emergency Medicine

## 2023-01-28 ENCOUNTER — Other Ambulatory Visit: Payer: Self-pay

## 2023-01-28 DIAGNOSIS — G43909 Migraine, unspecified, not intractable, without status migrainosus: Secondary | ICD-10-CM | POA: Insufficient documentation

## 2023-01-28 DIAGNOSIS — R519 Headache, unspecified: Secondary | ICD-10-CM | POA: Diagnosis present

## 2023-01-28 MED ORDER — METOCLOPRAMIDE HCL 5 MG/ML IJ SOLN
5.0000 mg | Freq: Once | INTRAMUSCULAR | Status: DC
  Filled 2023-01-28: qty 2

## 2023-01-28 MED ORDER — METOCLOPRAMIDE HCL 5 MG/ML IJ SOLN: 5.0000 mg | Freq: Once | INTRAMUSCULAR | Status: DC

## 2023-01-28 MED ORDER — DIPHENHYDRAMINE HCL 50 MG/ML IJ SOLN
12.5000 mg | Freq: Once | INTRAMUSCULAR | Status: AC
  Administered 2023-01-28: 12.5 mg via INTRAMUSCULAR
  Filled 2023-01-28: qty 1

## 2023-01-28 MED ORDER — KETOROLAC TROMETHAMINE 30 MG/ML IJ SOLN
30.0000 mg | Freq: Once | INTRAMUSCULAR | Status: AC
  Administered 2023-01-28: 30 mg via INTRAMUSCULAR
  Filled 2023-01-28: qty 1

## 2023-01-28 MED ORDER — BUTALBITAL-APAP-CAFFEINE 50-325-40 MG PO TABS: 1.0000 | ORAL_TABLET | Freq: Four times a day (QID) | ORAL | 0 refills | Status: DC | PRN

## 2023-01-28 MED ORDER — METOCLOPRAMIDE HCL 5 MG/ML IJ SOLN
5.0000 mg | Freq: Once | INTRAMUSCULAR | Status: AC
  Administered 2023-01-28: 5 mg via INTRAMUSCULAR

## 2023-01-28 NOTE — ED Triage Notes (Signed)
Pt. Arrives with partner c/o on and off headaches for the past 3 weeks. Pt. States that she has tried taking ibuprofen for her pain but has not had any relief. She denies having seasonal allergies. She denies N&V.

## 2023-01-28 NOTE — ED Provider Notes (Signed)
[place] [facility] AT [facility] Provider Note   CSN: [id number] Arrival date & time: 01/28/23  2001     History  Chief Complaint  Patient presents with   Headache    Jeanne Chaney is a 32 y.o. female.  32 year old female with history of migraines presents recurrent headaches times several weeks.  States that this is similar to her migraine equivalent.  Notes increased stress recently.  Denies any fever or emesis.  Has had some photophobia.  No focal weakness.  Pain is temporary relieved with over-the-counter medications.  Denies any visual changes.  Unsure what triggered these current symptoms       Home Medications Prior to Admission medications   Medication Sig Start Date End Date Taking? Authorizing Provider  acetaminophen (TYLENOL) 325 MG tablet Take 2 tablets (650 mg total) by mouth every 6 (six) hours as needed. 09/22/22   Sloan Leiter, DO  Etonogestrel (IMPLANON [place]) Inject 1 application into the skin once. Patient not taking: Reported on 05/01/2022    [provider]  fluticasone (FLONASE) 50 MCG/ACT nasal spray Place 1 spray into both nostrils daily. 08/23/22   Raspet, Noberto Retort, PA-C  ibuprofen (ADVIL) 600 MG tablet Take 1 tablet (600 mg total) by mouth every 6 (six) hours as needed. 09/22/22   Sloan Leiter, DO  menthol-cetylpyridinium (CEPACOL) 3 MG lozenge Take 1 lozenge (3 mg total) by mouth as needed for sore throat. 09/22/22   Sloan Leiter, DO  ondansetron (ZOFRAN) 4 MG tablet Take 1 tablet (4 mg total) by mouth every 8 (eight) hours as needed for up to 8 doses for nausea or vomiting. Patient not taking: Reported on 05/01/2022 04/03/21   Lauro Franklin, MD  promethazine-dextromethorphan (PROMETHAZINE-DM) 6.25-15 MG/5ML syrup Take 5 mLs by mouth 4 (four) times daily as needed for cough. 08/23/22   Raspet, Noberto Retort, PA-C      Allergies    Patient has no known allergies.    Review of Systems   Review of Systems  All other systems  reviewed and are negative.   Physical Exam Updated Vital Signs BP (!) 145/83 (BP Location: Right Arm)   Pulse 79   Temp 98.6 F (37 C) (Oral)   Resp 18   Ht 1.651 m (5\' 5" )   Wt 77.1 kg   LMP 01/22/2023 (Approximate)   SpO2 95%   BMI 28.29 kg/m  Physical Exam Vitals and nursing note reviewed.  Constitutional:      General: She is not in acute distress.    Appearance: Normal appearance. She is well-developed. She is not toxic-appearing.  HENT:     Head: Normocephalic and atraumatic.  Eyes:     General: Lids are normal.     Conjunctiva/sclera: Conjunctivae normal.     Pupils: Pupils are equal, round, and reactive to light.  Neck:     Thyroid: No thyroid mass.     Trachea: No tracheal deviation.  Cardiovascular:     Rate and Rhythm: Normal rate and regular rhythm.     Heart sounds: Normal heart sounds. No murmur heard.    No gallop.  Pulmonary:     Effort: Pulmonary effort is normal. No respiratory distress.     Breath sounds: Normal breath sounds. No stridor. No decreased breath sounds, wheezing, rhonchi or rales.  Abdominal:     General: There is no distension.     Palpations: Abdomen is soft.     Tenderness: There is no abdominal  tenderness. There is no rebound.  Musculoskeletal:        General: No tenderness. Normal range of motion.     Cervical back: Normal range of motion and neck supple.  Skin:    General: Skin is warm and dry.     Findings: No abrasion or rash.  Neurological:     General: No focal deficit present.     Mental Status: She is alert and oriented to person, place, and time. Mental status is at baseline.     GCS: GCS eye subscore is 4. GCS verbal subscore is 5. GCS motor subscore is 6.     Cranial Nerves: No cranial nerve deficit.     Sensory: No sensory deficit.     Motor: Motor function is intact.     Gait: Gait is intact.  Psychiatric:        Attention and Perception: Attention normal.        Speech: Speech normal.        Behavior: Behavior  normal.     ED Results / Procedures / Treatments   Labs (all labs ordered are listed, but only abnormal results are displayed) Labs Reviewed - No data to display  EKG None  Radiology No results found.  Procedures Procedures    Medications Ordered in ED Medications - No data to display  ED Course/ Medical Decision Making/ A&P                             Medical Decision Making  Patient with typical migraine equivalent here.  Given migraine cocktail.  No indication for intracranial imaging at this time.  Will prescribe Fioricet and she will follow-up with her primary care doctor        Final Clinical Impression(s) / ED Diagnoses Final diagnoses:  None    Rx / DC Orders ED Discharge Orders     None         Lorre Nick, MD 01/28/23 2139

## 2023-02-07 ENCOUNTER — Encounter (INDEPENDENT_AMBULATORY_CARE_PROVIDER_SITE_OTHER): Payer: Self-pay | Admitting: Primary Care

## 2023-02-07 ENCOUNTER — Ambulatory Visit (INDEPENDENT_AMBULATORY_CARE_PROVIDER_SITE_OTHER): Payer: Medicaid Other | Admitting: Primary Care

## 2023-02-07 DIAGNOSIS — F419 Anxiety disorder, unspecified: Secondary | ICD-10-CM | POA: Diagnosis not present

## 2023-02-07 DIAGNOSIS — F32A Depression, unspecified: Secondary | ICD-10-CM

## 2023-02-07 DIAGNOSIS — L859 Epidermal thickening, unspecified: Secondary | ICD-10-CM | POA: Diagnosis not present

## 2023-02-11 ENCOUNTER — Encounter (INDEPENDENT_AMBULATORY_CARE_PROVIDER_SITE_OTHER): Payer: Self-pay | Admitting: Primary Care

## 2023-02-11 NOTE — Progress Notes (Signed)
   Established Patient Office Visit  Subjective   Patient ID: Jeanne Chaney, female    DOB: 07-05-1991  Age: 32 y.o. MRN: [id number]  Chief Complaint  Patient presents with   Hospitalization Follow-up    ED   Foot Pain    Callus on left foot     Foot Pain [name] is a 32 year old female who presented to the emergency room on 01/28/2023 with complaints of headaches diagnosed with Migraine without status migrainosus, not intractable, unspecified migraine type and treated.  Headaches have resolved.  Today she is complaining of a callus on her left foot however it is between her toes. Patient has No headache, No chest pain, No abdominal pain - No Nausea, No new weakness tingling or numbness, No Cough - shortness of breath   ROS Comprehensive ROS Pertinent positive and negative noted in HPI     Objective:  Last Menstrual Period 01/22/2023 (Approximate)    Physical Exam Vitals reviewed.  HENT:     Head: Normocephalic.     Right Ear: External ear normal.     Left Ear: External ear normal.     Nose: Nose normal.  Cardiovascular:     Rate and Rhythm: Normal rate and regular rhythm.  Pulmonary:     Effort: Pulmonary effort is normal.     Breath sounds: Normal breath sounds.  Abdominal:     General: Bowel sounds are normal.     Palpations: Abdomen is soft.  Musculoskeletal:     Cervical back: Normal range of motion.  Skin:    Comments: See picture  Neurological:     Mental Status: She is alert and oriented to person, place, and time.    No results found for any visits on 02/07/23.   The ASCVD Risk score (Arnett DK, et al., 2019) failed to calculate for the following reasons:   The 2019 ASCVD risk score is only valid for ages 97 to 17    Assessment & Plan:  [name] was seen today for hospitalization follow-up and foot pain.  Diagnoses and all orders for this visit:  Hyperkeratosis  -     Ambulatory referral to Podiatry  Anxiety and depression Flowsheet Row  Office Visit from 02/07/2023 in [facility]  PHQ-9 Total Score 16     Will refer to clinical social worker     No follow-ups on file.    [name], NP

## 2023-02-14 ENCOUNTER — Other Ambulatory Visit (HOSPITAL_COMMUNITY)
Admission: RE | Admit: 2023-02-14 | Discharge: 2023-02-14 | Disposition: A | Discharge status: Home / Self Care | Payer: Medicaid Other | Source: Ambulatory Visit | Attending: Primary Care | Admitting: Primary Care

## 2023-02-14 ENCOUNTER — Encounter (INDEPENDENT_AMBULATORY_CARE_PROVIDER_SITE_OTHER): Payer: Self-pay | Admitting: Primary Care

## 2023-02-14 ENCOUNTER — Ambulatory Visit (INDEPENDENT_AMBULATORY_CARE_PROVIDER_SITE_OTHER): Payer: Medicaid Other | Admitting: Primary Care

## 2023-02-14 VITALS — BP 121/76 | HR 84 | Resp 16 | Wt 182.4 lb

## 2023-02-14 DIAGNOSIS — F332 Major depressive disorder, recurrent severe without psychotic features: Secondary | ICD-10-CM | POA: Diagnosis not present

## 2023-02-14 DIAGNOSIS — Z124 Encounter for screening for malignant neoplasm of cervix: Secondary | ICD-10-CM

## 2023-02-14 MED ORDER — CITALOPRAM HYDROBROMIDE 10 MG PO TABS: 10.0000 mg | ORAL_TABLET | Freq: Every day | ORAL | 3 refills | Status: DC

## 2023-02-14 NOTE — Patient Instructions (Signed)
Citalopram Tablets What is this medication? CITALOPRAM (sye TAL oh pram) treats depression. It increases the amount of serotonin in the brain, a hormone that helps regulate mood. It belongs to a group of medications called SSRIs. This medicine may be used for other purposes; ask your health care provider or pharmacist if you have questions. COMMON BRAND NAME(S): Celexa What should I tell my care team before I take this medication? They need to know if you have any of these conditions: Bipolar disorder or a family history of bipolar disorder Bleeding disorder Glaucoma Heart disease History of irregular heartbeat Kidney disease Liver disease Low levels of magnesium or potassium in the blood Receiving electroconvulsive therapy Seizures Suicidal thoughts, plans, or attempt by you or a family member Take medications that treat or prevent blood clots Thyroid disease An unusual or allergic reaction to citalopram, other medications, foods, dyes, or preservatives Pregnant or trying to become pregnant Breastfeeding How should I use this medication? Take this medication by mouth with water. Take it as directed on the prescription label at the same time every day. You can take it with or without food. Do not take your medication more often than directed. Do not stop taking this medication suddenly except upon the advice of your care team. Stopping this medication too quickly may cause serious side effects or your condition may worsen. A special MedGuide will be given to you by the pharmacist with each prescription and refill. Be sure to read this information carefully each time. Talk to your care team about the use of this medication in children. Special care may be needed. People 60 years and older may have a stronger reaction and need a smaller dose. Overdosage: If you think you have taken too much of this medicine contact a poison control center or emergency room at once. NOTE: This medicine is  only for you. Do not share this medicine with others. What if I miss a dose? If you miss a dose, take it as soon as you can. If it is almost time for your next dose, take only that dose. Do not take double or extra doses. What may interact with this medication? Do not take this medication with any of the following: Certain medications for fungal infections, such as fluconazole, itraconazole, ketoconazole, posaconazole, voriconazole Cisapride Dronedarone Escitalopram Linezolid MAOIs, such as Carbex, Eldepryl, Marplan, Nardil, and Parnate Methylene blue (injected into a vein) Pimozide Thioridazine This medication may also interact with the following: Alcohol Amphetamines Aspirin and aspirin-like medications Carbamazepine Certain medications for infections, such as chloroquine, clarithromycin, erythromycin, furazolidone, isoniazid, pentamidine Certain medications for mental health conditions Certain medications for migraine headaches, such as almotriptan, eletriptan, frovatriptan, naratriptan, rizatriptan, sumatriptan, zolmitriptan Certain medications for sleep Certain medications that treat or prevent blood clots, such as dalteparin, enoxaparin, warfarin Cimetidine Diuretics Dofetilide Fentanyl Lithium Methadone Metoprolol NSAIDs, medications for pain and inflammation, such as ibuprofen or naproxen Omeprazole Other medications that cause heart rhythm changes Procarbazine Rasagiline Supplements, such as St. John's wort, kava kava, valerian Tramadol Tryptophan Ziprasidone This list may not describe all possible interactions. Give your health care provider a list of all the medicines, herbs, non-prescription drugs, or dietary supplements you use. Also tell them if you smoke, drink alcohol, or use illegal drugs. Some items may interact with your medicine. What should I watch for while using this medication? Tell your care team if your symptoms do not get better or if they get  worse. Visit your care team for regular checks on your  progress. Because it may take several weeks to see the full effects of this medication, it is important to continue your treatment as prescribed. This medication may cause thoughts of suicide or depression. This includes sudden changes in mood, behaviors, or thoughts. These changes can happen at any time but are more common in the beginning of treatment or after a change in dose. Call your care team right away if you experience these thoughts or worsening depression. This medication may cause mood and behavior changes, such as anxiety, nervousness, irritability, hostility, restlessness, excitability, hyperactivity, or trouble sleeping. These changes can happen at any time but are more common in the beginning of treatment or after a change in dose. Call your care team right away if you notice any of these symptoms. This medication may affect your coordination, reaction time, or judgment. Do not drive or operate machinery until you know how this medication affects you. Sit up or stand slowly to reduce the risk of dizzy or fainting spells. Drinking alcohol with this medication can increase the risk of these side effects. Your mouth may get dry. Chewing sugarless gum or sucking hard candy and drinking plenty of water may help. Contact your care team if the problem does not go away or is severe. What side effects may I notice from receiving this medication? Side effects that you should report to your care team as soon as possible: Allergic reactions--skin rash, itching, hives, swelling of the face, lips, tongue, or throat Bleeding--bloody or black, tar-like stools, red or dark brown urine, vomiting blood or brown material that looks like coffee grounds, small, red or purple spots on skin, unusual bleeding or bruising Heart rhythm changes--fast or irregular heartbeat, dizziness, feeling faint or lightheaded, chest pain, trouble breathing Low sodium  level--muscle weakness, fatigue, dizziness, headache, confusion Serotonin syndrome--irritability, confusion, fast or irregular heartbeat, muscle stiffness, twitching muscles, sweating, high fever, seizure, chills, vomiting, diarrhea Sudden eye pain or change in vision such as blurry vision, seeing halos around lights, vision loss Thoughts of suicide or self-harm, worsening mood, feelings of depression Side effects that usually do not require medical attention (report to your care team if they continue or are bothersome): Change in sex drive or performance Diarrhea Dry mouth Excessive sweating Nausea Tremors or shaking Upset stomach This list may not describe all possible side effects. Call your doctor for medical advice about side effects. You may report side effects to FDA at 1-800-FDA-1088. Where should I keep my medication? Keep out of reach of children and pets. Store at room temperature between 15 and 30 degrees C (59 and 86 degrees F). Throw away any unused medication after the expiration date. NOTE: This sheet is a summary. It may not cover all possible information. If you have questions about this medicine, talk to your doctor, pharmacist, or health care provider.  2024 Elsevier/Gold Standard (2022-05-02 00:00:00) Pap Test Why am I having this test? A Pap test, also called a Pap smear, is a screening test to check for signs of: Infection. Cancer of the cervix. The cervix is the lower part of the uterus that opens into the vagina. Changes that may be a sign that cancer is developing (precancerous changes). Women need this test on a regular basis. In general, you should have a Pap test every 3 years until you reach menopause or age 73. Women aged 30-60 may choose to have their Pap test done at the same time as an HPV (human papillomavirus) test every 5 years (instead of every  3 years). Your health care provider may recommend having Pap tests more or less often depending on your  medical conditions and past Pap test results. What is being tested? Cervical cells are tested for signs of infection or abnormalities. What kind of sample is taken?  Your health care provider will collect a sample of cells from the surface of your cervix. This will be done using a small cotton swab, plastic spatula, or brush that is inserted into your vagina using a tool called a speculum. This sample is often collected during a pelvic exam, when you are lying on your back on an exam table with your feet in footrests (stirrups). In some cases, fluids (secretions) from the cervix or vagina may also be collected. How do I prepare for this test? Be aware of where you are in your menstrual cycle. If you are menstruating on the day of the test, you may be asked to reschedule. You may need to reschedule if you have a known vaginal infection on the day of the test. Follow instructions from your health care provider about: Changing or stopping your regular medicines. Some medicines can cause abnormal test results, such as vaginal medicines and tetracycline. Avoiding douching 2-3 days before or the day of the test. Tell a health care provider about: Any allergies you have. All medicines you are taking, including vitamins, herbs, eye drops, creams, and over-the-counter medicines. Any bleeding problems you have. Any surgeries you have had. Any medical conditions you have. Whether you are pregnant or may be pregnant. How are the results reported? Your test results will be reported as either abnormal or normal. What do the results mean? A normal test result means that you do not have signs of cancer of the cervix. An abnormal result may mean that you have: Cancer. A Pap test by itself is not enough to diagnose cancer. You will have more tests done if cancer is suspected. Precancerous changes in your cervix. Inflammation of the cervix. An STI (sexually transmitted infection). A fungal infection. A  parasite infection. Talk with your health care provider about what your results mean. In some cases, your health care provider may do more testing to confirm the results. Questions to ask your health care provider Ask your health care provider, or the department that is doing the test: When will my results be ready? How will I get my results? What are my treatment options? What other tests do I need? What are my next steps? Summary In general, women should have a Pap test every 3 years until they reach menopause or age 32. Your health care provider will collect a sample of cells from the surface of your cervix. This will be done using a small cotton swab, plastic spatula, or brush. In some cases, fluids (secretions) from the cervix or vagina may also be collected. This information is not intended to replace advice given to you by your health care provider. Make sure you discuss any questions you have with your health care provider. Document Revised: 11/05/2020 Document Reviewed: 11/05/2020 Elsevier Patient Education  2024 [employer].

## 2023-02-15 NOTE — Progress Notes (Signed)
[facility]   Subjective:   Jeanne Chaney is a 32 y.o. G36P2002 female here for a routine annual gynecologic exam.  Current complaints: none.   Denies abnormal vaginal bleeding, discharge, pelvic pain, problems with intercourse or other gynecologic concerns.    Gynecologic History Patient's last menstrual period was 01/22/2023 (approximate). Last Pap: 01/30/20. Results were: normal   Health Maintenance  Topic Date Due   COVID-19 Vaccine (1) Never done   PAP SMEAR-Modifier  01/30/2023   INFLUENZA VACCINE  03/22/2023   DTaP/Tdap/Td (2 - Td or Tdap) 05/29/2027   Hepatitis C Screening  Completed   HIV Screening  Completed   HPV VACCINES  Aged Out    Obstetric History OB History  Gravida Para Term Preterm AB Living  2 2 2     2   SAB IAB Ectopic Multiple Live Births          2    # Outcome Date GA Lbr Len/2nd Weight Sex Delivery Anes PTL Lv  2 Term           1 Term             Past Medical History:  Diagnosis Date   Herpes genitalia    Migraines    Trichimoniasis    Yeast infection     History reviewed. No pertinent surgical history.  Current Outpatient Medications on File Prior to Visit  Medication Sig Dispense Refill   butalbital-acetaminophen-caffeine (FIORICET) 50-325-40 MG tablet Take 1-2 tablets by mouth every 6 (six) hours as needed for headache. 20 tablet 0   No current facility-administered medications on file prior to visit.    No Known Allergies  Social History   Socioeconomic History   Marital status: Single    Spouse name: Not on file   Number of children: Not on file   Years of education: Not on file   Highest education level: Not on file  Occupational History   Not on file  Tobacco Use   Smoking status: Never   Smokeless tobacco: Never  Vaping Use   Vaping Use: Never used  Substance and Sexual Activity   Alcohol use: No   Drug use: Yes    Types: Marijuana   Sexual activity: Yes    Birth control/protection: Implant   Other Topics Concern   Not on file  Social History Narrative   Not on file   Social Determinants of Health   Financial Resource Strain: Not on file  Food Insecurity: Not on file  Transportation Needs: Not on file  Physical Activity: Not on file  Stress: Not on file  Social Connections: Not on file  Intimate Partner Violence: Not on file    Family History  Problem Relation Age of Onset   Asthma Mother     The following portions of the patient's history were reviewed and updated as appropriate: allergies, current medications, past family history, past medical history, past social history, past surgical history and problem list.  Review of Systems Pertinent items noted in HPI and remainder of comprehensive ROS otherwise negative.   Objective:  Blood Pressure 121/76   Pulse 84   Respiration 16   Weight 182 lb 6.4 oz (82.7 kg)   Last Menstrual Period 01/22/2023 (Approximate)   Oxygen Saturation 99%   Body Mass Index 30.35 kg/m  CONSTITUTIONAL: Well-developed, well-nourished female in no acute distress.  HENT:  Normocephalic, atraumatic, External right and left ear normal. Oropharynx is clear and moist EYES: Conjunctivae and EOM  are normal. Pupils are equal, round, and reactive to light. No scleral icterus.  NECK: Normal range of motion, supple, no masses.  Normal thyroid.  SKIN: Skin is warm and dry. No rash noted. Not diaphoretic. No erythema. No pallor. NEUROLGIC: Alert and oriented to person, place, and time. Normal reflexes, muscle tone coordination. No cranial nerve deficit noted. PSYCHIATRIC: Normal mood and affect. Normal behavior. Normal judgment and thought content. CARDIOVASCULAR: Normal heart rate noted, regular rhythm RESPIRATORY: Clear to auscultation bilaterally. Effort and breath sounds normal, no problems with respiration noted. BREASTS: Symmetric in size. No masses, skin changes, nipple drainage, or lymphadenopathy. Taught self breast exam and had patient to  demonstrate SBE. ABDOMEN: Soft, normal bowel sounds, no distention noted.  No tenderness, rebound or guarding.  PELVIC: Normal appearing external genitalia; normal appearing vaginal mucosa and cervix.  No abnormal discharge noted.  Pap smear obtained.  Normal uterine size, no other palpable masses, no uterine or adnexal tenderness. MUSCULOSKELETAL: Normal range of motion. No tenderness.  No cyanosis, clubbing, or edema.  2+ distal pulses.   Assessment:  Annual gynecologic examination with pap smear   Plan:   Jeanne Chaney was seen today for gynecologic exam.  Diagnoses and all orders for this visit:  Severe episode of recurrent major depressive disorder, without psychotic features (HCC) -     citalopram (CELEXA) 10 MG tablet; Take 1 tablet (10 mg total) by mouth daily.  Cervical cancer screening -     Cervicovaginal ancillary only -     Cytology - PAP   Will follow up results of pap smear and manage accordingly.  Routine preventative health maintenance measures emphasized. Please refer to After Visit Summary for other counseling recommendations.   This note has been created with Education officer, environmental. Any transcriptional errors are unintentional.   [name], NP 02/15/2023, 1:10 PM

## 2023-02-16 LAB — CERVICOVAGINAL ANCILLARY ONLY
Bacterial Vaginitis (gardnerella): POSITIVE — AB
Candida Glabrata: NEGATIVE
Candida Vaginitis: NEGATIVE
Chlamydia: POSITIVE — AB
Comment: NEGATIVE
Comment: NEGATIVE
Comment: NEGATIVE
Comment: NEGATIVE
Comment: NEGATIVE
Comment: NORMAL
Neisseria Gonorrhea: NEGATIVE
Trichomonas: NEGATIVE

## 2023-02-19 LAB — CYTOLOGY - PAP
Comment: NEGATIVE
Diagnosis: NEGATIVE
High risk HPV: NEGATIVE

## 2023-02-20 ENCOUNTER — Ambulatory Visit (INDEPENDENT_AMBULATORY_CARE_PROVIDER_SITE_OTHER): Payer: MEDICAID | Admitting: Podiatry

## 2023-02-20 DIAGNOSIS — L84 Corns and callosities: Secondary | ICD-10-CM

## 2023-02-20 DIAGNOSIS — B353 Tinea pedis: Secondary | ICD-10-CM

## 2023-02-20 DIAGNOSIS — M205X2 Other deformities of toe(s) (acquired), left foot: Secondary | ICD-10-CM

## 2023-02-20 MED ORDER — CLOTRIMAZOLE-BETAMETHASONE 1-0.05 % EX CREA: 1.0000 | TOPICAL_CREAM | Freq: Every day | CUTANEOUS | 1 refills | Status: DC

## 2023-02-20 NOTE — Progress Notes (Signed)
Chief Complaint  Patient presents with   Callouses    Pt has a callus between her 4th and 5th toe left foot and left side of her left foot also that is causing her pain to walk and with certain shoes she says it has been there for a while she has only been putting ointment     HPI: 32 y.o. female presents today with 2 concerns today.  Notes that she has some itching between the toes and this has been going on for at least a month.  Denies any blisters or drainage from between the toes.  Her primary concern is with a skin lesion that has been quite painful between the left fourth and fifth toes.  Now she is not able to see the area very well to be able to describe it.  Feels that this has been present for "a while" she has tried applying some ointment to the area without relief.  Certain shoe gear aggravates it.  It has been progressively worsening.  Past Medical History:  Diagnosis Date   Herpes genitalia    Migraines    Trichimoniasis    Yeast infection     No past surgical history on file.  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       Corn between left 4th and 5th toes      Physical Exam: There were no vitals filed for this visit.  General: The patient is alert and oriented x3 in no acute distress.  Dermatology: Skin is warm, dry and supple bilateral lower extremities.  There is diffuse scaling and peeling between the toes of both feet.  Minimal maceration noted between the base third fourth and fifth toes bilateral.  No open lesions.  There is a moderate-sized hyperkeratotic lesion on the lateral aspect of the left fourth toe at the level of the PIP joint.  No surrounding erythema or edema is noted.  Vascular: Palpable pedal pulses bilaterally. Capillary refill within normal limits.  No appreciable edema.  No erythema or calor.  Neurological: Light touch sensation grossly intact  bilateral feet.   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  Assessment/Plan of Care: 1. Corn of toe   2. Tinea pedis of both feet   3. Curly toe, acquired, left      Meds ordered this encounter  Medications   clotrimazole-betamethasone (LOTRISONE) cream    Sig: Apply 1 Application topically daily.    Dispense:  30 g    Refill:  1   Discussed clinical findings with patient today.  Prescription for Lotrisone cream sent to patient's pharmacy to apply between the toes twice daily to resolve the interdigital tinea pedis.  If she has no improvement within the next 10 to 14 days she will need to call so we can initiate different topical therapy.  Discussed any risks or possible side effects using this topical medication.  The hyperkeratotic lesion in the left fourth interspace was carefully shaved with a sterile #313 blade.  She tolerated this well.  She was shown different types of gel foam spacers between the toes.  We discussed the etiology of the corn with regard to the adductovarus position of the toes.  If there continues to be rubbing of the toes and under lapping of the fifth toe she will continue  to have this corn recur.  Definitive correction would be surgical derotational arthroplasty of the fourth and fifth toes.  Briefly discussed 3 to 4-week recovery in a surgical shoe if she were to undergo this procedure  Follow-up as needed   Clerance Lav, DPM, FACFAS [facility]     [address] [place], [place] [address]                Office [phone]  Fax [phone]

## 2023-02-21 ENCOUNTER — Telehealth (INDEPENDENT_AMBULATORY_CARE_PROVIDER_SITE_OTHER): Payer: Self-pay

## 2023-02-21 NOTE — Telephone Encounter (Signed)
Copied from CRM [id number]. Topic: General - Other >> Feb 21, 2023 11:08 AM Macon Large wrote: Reason for CRM: Pt requests call back to discuss retaking her test. Cb# [phone]

## 2023-02-23 ENCOUNTER — Encounter: Payer: Self-pay | Admitting: Podiatry

## 2023-02-23 DIAGNOSIS — M205X2 Other deformities of toe(s) (acquired), left foot: Secondary | ICD-10-CM | POA: Insufficient documentation

## 2023-02-23 DIAGNOSIS — B353 Tinea pedis: Secondary | ICD-10-CM | POA: Insufficient documentation

## 2023-02-23 DIAGNOSIS — L84 Corns and callosities: Secondary | ICD-10-CM | POA: Insufficient documentation

## 2023-02-26 ENCOUNTER — Telehealth (INDEPENDENT_AMBULATORY_CARE_PROVIDER_SITE_OTHER): Payer: Self-pay | Admitting: Licensed Clinical Social Worker

## 2023-02-26 NOTE — Telephone Encounter (Signed)
LCSWA called patient today to introduce herself and to assess patients' mental health needs. Patient did not answer the phone. LCSWA was able to leave a brief message with the patient asking them to return the call. Patient was referred by PCP for Anxiety and depression.   Counseling Resources   [email or web address]  [facility] [address] [place], [place] [address] [phone] or [phone] Walk-in urgent care 24/7 for anyone  For [facility] ONLY New patient assessments and therapy walk-ins: Monday and Wednesday 8am-11am First and second Friday 1pm-5pm New patient psychiatry and medication management walk-ins: Mondays, Wednesdays, Thursdays, Fridays 8am-11am No psychiatry walk-ins on Tuesday   *Accepts all insurance and uninsured for Urgent Care needs *Accepts Medicaid and uninsured for outpatient treatment   [facility] (Therapy and psychiatry) [facility] (near [employer]) [address] [place], [place] [address] [phone] Fax: [phone] (INSURANCE REQUIRED-MEDICAID ACCEPTED)   [facility] Address: Four Locations  -[address] [place], [name], [place]                                 Phone: [phone] -[address], [place], [place]         Phone: [phone] -[address] [place], [place]                      Phone: [phone] -[address], [place], [place]         Phone: [phone] Age Range: Children, Adolescents, and Adults Specialty Areas: Depression, Anxiety, ADHD, Substance Abuse, Bipolar Disorder, etc.  [facility] Address: [address], [place], [place] Phone: [phone] Age Range: Children, Adults, and Elders Specialty Areas: Couple, Family, Group, Individual     [facility] Address: [address] [place], [place] Phone:  [phone] Age Range: Children, Adolescents, and Adults Specialty Areas: Individual, Family and Couples Therapy, and Substance Abuse  [facility] Address: [address] [place], [place] [address] Phone: [phone] Age Range: Adolescents/Teenagers and Adults Specialty Areas: Individual, Family, Couples, and Group Counseling   [facility] Address: [address] [place], [place] Phone: [phone] Age Range: Children and Adults Specialty Areas: Individual, Group, and Family counseling to people who have experienced domestic violence or sexual assault  [facility] Address: [address] [place], [place] [address] Phone: [phone] Age Range: Adults & Children (Ages 3+) Specialty Areas: Mental Health and Substance Abuse Counseling Services  [facility] Address: [address] [place], [place], [place] [address] Phone: [phone]             Age Range: All Ages  Specialty Areas: Common mental health diagnoses such as Anxiety, Depression, ADD/ADHD, Bipolar, and PTSD, Substance Abuse Evaluations and Counseling   [facility] at [facility] [address] [place] [address] [place],  [place]  [address] [phone] Call for appointment  [facility] (Therapy only)  [facility] [address], [place], [place] [address] Monday - Friday: 8:30 a.m.-12 p.m. / 1 p.m.-2:30 p.m.  The [facility] [address], [place], [place] [address] Monday-Friday: 8:30 a.m.-12 p.m. / 2-3:30 p.m. (INSURANCE REQUIRED -MEDICAID ACCEPTED) They do offer a sliding fee scale $20-$30/session   [facility] [address] [place], [place] [address] Phone: [phone]  [facility] [address] [place] [address]  Phone: [phone] (Does not accept Medicaid) (only one provider accepts Medicare)  [facility] [address] (at  [employer], [place] [phone] (Accepts Medicaid and Medicare)  [facility]) [address]  [place], [place] [address]  Phone: [phone]  [address] [place], [place] [address] Phone: [phone] [facility] Medicaid) [facility] (Therapy only)  [address] [place], [place] [address] Phone: [phone]   [facility] (Therapy only)  [address] [place], [place] [address] Phone: [phone] [facility])   [facility] [address] [place], [place] [address] Phone: [phone] (Accepts [place] Health Choice) [facility] [phone] [address] [place], [place] [address] Phone: [phone] [facility]) [facility] (Psychiatry only)  [phone] [address], [place], [place] [address] (Accepts Medicaid and Medicare) [facility] (Psychiatry and therapy)  [address] [place], [place] [address] [phone] [facility] Medicare) [facility] (psychiatry and therapy) [address], [place], [place] [address] [phone]  [facility] at [address], [place], [place] [address] [phone] Accept [address], [place], [place], [place], [place],  and the following types of Medicaid; Alliance, [place], Partners, [place], [place] Health Choice, [employer], Healthy [place], [place], and [place], as well as offering a Careers adviser and private payment options. Provides In-Office Appointments, Virtual Appointments, and Phone Consultations Offers medication management for ages 34 years old and up, including,  Medication Management for Suboxone and [facility] [phone] [address] [place] [address], [place], [place] [address] (Accepts Medicaid and Medicare)         19.  [facility] (therapy only)  [address]  [place], [place] [address]            [phone] (Accepts medicare) 20. [facility]  (Suboxone and methodone) [phone] [address] [place], [place] [address] To Be Eligible for Opioid Treatment at [facility] you must be at least 31 years of age you have already tried other interventions that were not successful such as opioid detox, inpatient rehab for opioids, or outpatient counseling specifically for opioid dependency your [facility] drug test must be completely free of benzodiazepines (klonopin, xanax, valium, ativan, or other benz) you have reliable transportation to the [facility] clinic in [place] you recognize that counseling is a critical component of [facility]' Opioid Program and you agree to attend all required counseling sessions you are committed to total drug abstinence and will conscientiously strive to remain free of alcohol, marijuana, and other illicit substances while in treatment you desire a peaceful treatment atmosphere in which personal responsibility and respect toward staff and clients is the norm   21. [facility] [address] [place], [place], [place] [address] Offers SAIOP (Substance Abuse Intensive Outpatient Program) [phone] 22. [facility] [address] [place], [place] [phone] (Accepts medicare)  For those who are tech savvy, go on psychology today, type in your local city (i.e. [place]. [place]) and specify your insurance at the top of the screen after you search. (Medicaid if needed). You can also specify whether you are interested in therapy and psychiatry.  www.psychologytoday.com/us

## 2023-03-29 ENCOUNTER — Ambulatory Visit (INDEPENDENT_AMBULATORY_CARE_PROVIDER_SITE_OTHER): Payer: Medicaid Other | Admitting: Primary Care

## 2023-03-29 ENCOUNTER — Other Ambulatory Visit (HOSPITAL_COMMUNITY)
Admission: RE | Admit: 2023-03-29 | Discharge: 2023-03-29 | Disposition: A | Discharge status: Home / Self Care | Payer: MEDICAID | Source: Ambulatory Visit | Attending: Primary Care | Admitting: Primary Care

## 2023-03-29 ENCOUNTER — Ambulatory Visit (INDEPENDENT_AMBULATORY_CARE_PROVIDER_SITE_OTHER): Payer: MEDICAID | Admitting: Primary Care

## 2023-03-29 ENCOUNTER — Encounter (INDEPENDENT_AMBULATORY_CARE_PROVIDER_SITE_OTHER): Payer: Self-pay | Admitting: Primary Care

## 2023-03-29 VITALS — BP 128/77 | HR 61 | Temp 98.2°F | Ht 65.0 in | Wt 187.0 lb

## 2023-03-29 DIAGNOSIS — A5602 Chlamydial vulvovaginitis: Secondary | ICD-10-CM | POA: Insufficient documentation

## 2023-03-29 DIAGNOSIS — N760 Acute vaginitis: Secondary | ICD-10-CM | POA: Insufficient documentation

## 2023-03-29 DIAGNOSIS — F32A Depression, unspecified: Secondary | ICD-10-CM | POA: Diagnosis not present

## 2023-03-29 DIAGNOSIS — Z202 Contact with and (suspected) exposure to infections with a predominantly sexual mode of transmission: Secondary | ICD-10-CM

## 2023-03-29 DIAGNOSIS — F419 Anxiety disorder, unspecified: Secondary | ICD-10-CM | POA: Diagnosis not present

## 2023-03-29 MED ORDER — CITALOPRAM HYDROBROMIDE 20 MG PO TABS: 20.0000 mg | ORAL_TABLET | Freq: Every day | ORAL | 3 refills | Status: DC

## 2023-04-02 ENCOUNTER — Ambulatory Visit (INDEPENDENT_AMBULATORY_CARE_PROVIDER_SITE_OTHER): Payer: MEDICAID | Admitting: Primary Care

## 2023-04-02 ENCOUNTER — Encounter (INDEPENDENT_AMBULATORY_CARE_PROVIDER_SITE_OTHER): Payer: Self-pay | Admitting: Primary Care

## 2023-04-02 VITALS — BP 129/75 | HR 90 | Resp 16 | Wt 187.0 lb

## 2023-04-02 DIAGNOSIS — N760 Acute vaginitis: Secondary | ICD-10-CM

## 2023-04-02 DIAGNOSIS — A749 Chlamydial infection, unspecified: Secondary | ICD-10-CM | POA: Diagnosis not present

## 2023-04-02 DIAGNOSIS — B9689 Other specified bacterial agents as the cause of diseases classified elsewhere: Secondary | ICD-10-CM

## 2023-04-02 NOTE — Progress Notes (Signed)
    [facility]         Subjective:   Jeanne Chaney is an 32 y.o. female who presents for evaluation and treatment of depressive symptoms.  Current symptoms include depressed mood, insomnia, fatigue, difficulty concentrating, anxiety,.  Current treatment for depression:None Sleep problems: Moderate   Early awakening:Mild   Energy: Fair Motivation: Fair Concentration: Limited Rumination/worrying: Marked Memory: Good Tearfulness: Mild  Anxiety: Mild  Panic: Mild  Overall Mood: Minimally worse  Hopelessness: Mild Suicidal ideation: Absent  Other/Psychosocial Stressors: none Family history positive for depression in the patient's unknown.  Previous treatment modalities employed include None.  Past episodes of depression:yes Organic causes of depression present: None.  Review of Systems Pertinent items noted in HPI and remainder of comprehensive ROS otherwise negative.   Objective:   Mental Status Examination: Posture and motor behavior: Appropriate Dress, grooming, personal hygiene: Appropriate and Negative Facial expression: Appropriate Speech: Appropriate Mood: Appropriate Coherency and relevance of thought: Appropriate Thought content: Appropriate Perceptions: Appropriate Orientation:Appropriate Attention and concentration: Positive Memory: : Positive Information: Not examined Vocabulary: Appropriate Abstract reasoning: Appropriate Judgment: Appropriate    Assessment:   Experiencing the following symptoms of depression most of the day nearly every day for more than two consecutive weeks:   Depressive Disorder:yes  Suicide Risk Assessment:  Suicidal intent: no Suicidal plan: no Access to means for suicide: no Lethality of means for suicide: no Prior suicide attempts: no Recent exposure to suicide:no   Plan:   Anxiety and depression - Plan: citalopram (CELEXA) 20 MG tablet  Chlamydia contact, untreated - Plan: Cervicovaginal  ancillary only Labmedications were positive patient they were incorrect never took medication Cervical Ancillary     This note has been created with Education officer, environmental. Any transcriptional errors are unintentional.    04/02/2023, 12:17 AM [name], NP

## 2023-04-04 ENCOUNTER — Other Ambulatory Visit: Payer: Self-pay

## 2023-04-04 ENCOUNTER — Telehealth (INDEPENDENT_AMBULATORY_CARE_PROVIDER_SITE_OTHER): Payer: Self-pay | Admitting: Primary Care

## 2023-04-04 NOTE — Telephone Encounter (Addendum)
Pt is calling requesting an update on the medication Doxycycline that PCP was going to send in for pt. Pt asked what pharmacy it was sent in to.  Pt requested a callback today and stated the office never calls her back and she may just come into the office.  Please advise.

## 2023-04-04 NOTE — Telephone Encounter (Unsigned)
Copied from CRM [id number]. Topic: General - Other >> Apr 04, 2023 12:08 PM Everette C wrote: Reason for CRM: Medication Refill - Medication: Doxycycline 100 MG   Has the patient contacted their pharmacy? Yes.   (Agent: If no, request that the patient contact the pharmacy for the refill. If patient does not wish to contact the pharmacy document the reason why and proceed with request.) (Agent: If yes, when and what did the pharmacy advise?)  Preferred Pharmacy (with phone number or street name): [facility] [address] [place] [address] Phone: [phone] Fax: [phone] Hours: M-F 7:30a-6:00p   Has the patient been seen for an appointment in the last year OR does the patient have an upcoming appointment? Yes.    Agent: Please be advised that RX refills may take up to 3 business days. We ask that you follow-up with your pharmacy.

## 2023-04-05 ENCOUNTER — Other Ambulatory Visit: Payer: Self-pay

## 2023-04-05 ENCOUNTER — Other Ambulatory Visit (INDEPENDENT_AMBULATORY_CARE_PROVIDER_SITE_OTHER): Payer: Self-pay | Admitting: Primary Care

## 2023-04-05 ENCOUNTER — Encounter (INDEPENDENT_AMBULATORY_CARE_PROVIDER_SITE_OTHER): Payer: Self-pay | Admitting: Primary Care

## 2023-04-05 DIAGNOSIS — A749 Chlamydial infection, unspecified: Secondary | ICD-10-CM

## 2023-04-05 DIAGNOSIS — N760 Acute vaginitis: Secondary | ICD-10-CM

## 2023-04-05 MED ORDER — METRONIDAZOLE 500 MG PO TABS: 500.0000 mg | ORAL_TABLET | Freq: Two times a day (BID) | ORAL | 0 refills | Status: DC

## 2023-04-05 MED ORDER — DOXYCYCLINE HYCLATE 100 MG PO TABS: 100.0000 mg | ORAL_TABLET | Freq: Two times a day (BID) | ORAL | 0 refills | Status: DC

## 2023-04-05 NOTE — Progress Notes (Signed)
  [facility]  Jeanne Chaney, is a 32 y.o. female  ONG:[id number]  GMW:[id number]  DOB - 19-Oct-1990  No chief complaint on file.      Subjective:   Jeanne Chaney is a 32 y.o. female here today for a follow up visit. Previous visit we discussed her results that she denied and did not take medication. Explained if not treated it was being passed back and forth. She presented with her partner and stated would treat him. Paperwork filled out and sent medication to [facility]   No problems updated.  No Known Allergies  Past Medical History:  Diagnosis Date   Herpes genitalia    Migraines    Trichimoniasis    Yeast infection     Current Outpatient Medications on File Prior to Visit  Medication Sig Dispense Refill   butalbital-acetaminophen-caffeine (FIORICET) 50-325-40 MG tablet Take 1-2 tablets by mouth every 6 (six) hours as needed for headache. 20 tablet 0   citalopram (CELEXA) 20 MG tablet Take 1 tablet (20 mg total) by mouth daily. 30 tablet 3   No current facility-administered medications on file prior to visit.    Objective:   Vitals:   04/02/23 1618  BP: 129/75  Pulse: 90  Resp: 16  SpO2: 99%  Weight: 187 lb (84.8 kg)    Comprehensive ROS Pertinent positive and negative noted in HPI   Exam General appearance : Awake, alert, not in any distress. Speech Clear. Not toxic looking HEENT: Atraumatic and Normocephalic, pupils equally reactive to light and accomodation Neck: Supple, no JVD. No cervical lymphadenopathy.  Chest: Good air entry bilaterally, no added sounds  CVS: S1 S2 regular, no murmurs.  Abdomen: Bowel sounds present, Non tender and not distended with no gaurding, rigidity or rebound. Extremities: B/L Lower Ext shows no edema, both legs are warm to touch Neurology: Awake alert, and oriented X 3, Skin: No Rash  Data Review Lab Results  Component Value Date   HGBA1C 5.5 03/31/2021   HGBA1C 5.9 04/30/2017    Assessment & Plan   Diagnoses and all orders for this visit:  Chlamydia infection Doxycycline 100mg  BID for 7 days  BV (bacterial vaginosis) A prescription for metronidazole. You take this medication twice a day for 7 days. Be sure that you do not drink alcohol when you take this medication because the combination can give you severe nausea and vomiting.  It can sometimes give people a metallic taste in their mouth while they are taking it. When you take antibiotics, it can wipe out your gut flora.  This can cause problems like diarrhea.       Patient have been counseled extensively about nutrition and exercise. Other issues discussed during this visit include: low cholesterol diet, weight control and daily exercise, foot care, annual eye examinations at Ophthalmology, importance of adherence with medications and regular follow-up. We also discussed long term complications of uncontrolled diabetes and hypertension.   No follow-ups on file.  The patient was given clear instructions to go to ER or return to medical center if symptoms don't improve, worsen or new problems develop. The patient verbalized understanding. The patient was told to call to get lab results if they haven't heard anything in the next week.   This note has been created with Education officer, environmental. Any transcriptional errors are unintentional.   [name], NP 04/05/2023, 2:27 PM

## 2023-04-05 NOTE — Telephone Encounter (Signed)
Provider has already spoken with pt

## 2023-04-05 NOTE — Telephone Encounter (Signed)
Provider has addressed already

## 2023-04-05 NOTE — Telephone Encounter (Signed)
Pt is calling back regarding her medication. Per pt her medication is still not at the pharmacy. Please advise.

## 2023-04-11 ENCOUNTER — Telehealth (INDEPENDENT_AMBULATORY_CARE_PROVIDER_SITE_OTHER): Payer: Self-pay | Admitting: Primary Care

## 2023-04-11 ENCOUNTER — Encounter (INDEPENDENT_AMBULATORY_CARE_PROVIDER_SITE_OTHER): Payer: Self-pay | Admitting: Primary Care

## 2023-04-11 NOTE — Telephone Encounter (Signed)
Pt stated she wanted to ask PCP Gwinda Passe, if she needs to come back and be retested now that she has finished her medication.    Please advise.

## 2023-04-11 NOTE — Telephone Encounter (Signed)
Will forward to provider  

## 2023-08-20 ENCOUNTER — Encounter (HOSPITAL_COMMUNITY): Payer: Self-pay

## 2023-08-20 ENCOUNTER — Ambulatory Visit (HOSPITAL_COMMUNITY)
Admission: EM | Admit: 2023-08-20 | Discharge: 2023-08-20 | Disposition: A | Discharge status: Home / Self Care | Payer: MEDICAID | Attending: Family Medicine | Admitting: Family Medicine

## 2023-08-20 DIAGNOSIS — H6591 Unspecified nonsuppurative otitis media, right ear: Secondary | ICD-10-CM

## 2023-08-20 DIAGNOSIS — R051 Acute cough: Secondary | ICD-10-CM | POA: Diagnosis not present

## 2023-08-20 DIAGNOSIS — H6692 Otitis media, unspecified, left ear: Secondary | ICD-10-CM

## 2023-08-20 DIAGNOSIS — J069 Acute upper respiratory infection, unspecified: Secondary | ICD-10-CM | POA: Diagnosis not present

## 2023-08-20 LAB — POC COVID19/FLU A&B COMBO
Covid Antigen, POC: NEGATIVE
Influenza A Antigen, POC: NEGATIVE
Influenza B Antigen, POC: NEGATIVE

## 2023-08-20 MED ORDER — DM-GUAIFENESIN ER 30-600 MG PO TB12: 1.0000 | ORAL_TABLET | Freq: Two times a day (BID) | ORAL | 0 refills | Status: AC | PRN

## 2023-08-20 MED ORDER — CEFDINIR 300 MG PO CAPS: 300.0000 mg | ORAL_CAPSULE | Freq: Two times a day (BID) | ORAL | 0 refills | Status: AC

## 2023-08-20 NOTE — Discharge Instructions (Addendum)
Exam showed right and left ear infections with some fluid behind the right eardrum.  Encouraged Mucinex DM (use as directed on the package) to help clear the fluid behind the eardrum.  Cefdinir 300 mg, #1, twice daily after meals for 7 days for ear infection.  Influenza and Covid Tests were negative.  Return here if symptoms do not improve, worsen or if new symptoms occur.

## 2023-08-20 NOTE — ED Triage Notes (Signed)
Patient having runny nose, sneezing, headache, fatigue onset 3 days ago. Patient did travel to [place] a week ago.   Patient has not taken meds for her symptoms.

## 2023-08-20 NOTE — ED Provider Notes (Signed)
[facility]    CSN: [id number] Arrival date & time: 08/20/23  0920      History   Chief Complaint Chief Complaint  Patient presents with   Nasal Congestion   Headache    HPI Jeanne Chaney is a 32 y.o. female.   Presents with a 3-day history of cough, head congestion, sneezing, mild sore throat, sinus pressure and pain, mild intermittent abdominal pain and bodyaches.  She works at a nursing home and is concerned that she might have flu or COVID and needs to be tested before returning to work.  She did travel over the holidays and was in [place] for a brief time a few days ago.  She denies fever, nausea, vomiting, diarrhea, constipation.   Headache Associated symptoms: abdominal pain, congestion, fatigue, sinus pressure and sore throat   Associated symptoms: no back pain, no cough, no diarrhea, no ear pain, no eye pain, no nausea, no seizures and no vomiting     Past Medical History:  Diagnosis Date   Herpes genitalia    Migraines    Trichimoniasis    Yeast infection     Patient Active Problem List   Diagnosis Date Noted   Curly toe, acquired, left 02/23/2023   Tinea pedis of both feet 02/23/2023   Corn of toe 02/23/2023   Severe episode of recurrent major depressive disorder, without psychotic features (HCC) 03/30/2021   Vaginal irritation 04/30/2017   Herpes genitalis in women 10/06/2016   Pap smear for cervical cancer screening 10/06/2016    History reviewed. No pertinent surgical history.  OB History     Gravida  2   Para  2   Term  2   Preterm      AB      Living  2      SAB      IAB      Ectopic      Multiple      Live Births  2            Home Medications    Prior to Admission medications   Medication Sig Start Date End Date Taking? Authorizing Provider  cefdinir (OMNICEF) 300 MG capsule Take 1 capsule (300 mg total) by mouth 2 (two) times daily after a meal for 7 days. 08/20/23 08/27/23 Yes Prescilla Sours, FNP   dextromethorphan-guaiFENesin (MUCINEX DM) 30-600 MG 12hr tablet Take 1 tablet by mouth 2 (two) times daily as needed for up to 7 days for cough (or head congestion). 08/20/23 08/27/23 Yes Prescilla Sours, FNP  citalopram (CELEXA) 20 MG tablet Take 1 tablet (20 mg total) by mouth daily. 03/29/23   [name], NP    Family History Family History  Problem Relation Age of Onset   Asthma Mother     Social History Social History   Tobacco Use   Smoking status: Never   Smokeless tobacco: Never  Vaping Use   Vaping status: Never Used  Substance Use Topics   Alcohol use: No   Drug use: Yes    Types: Marijuana     Allergies   Patient has no known allergies.   Review of Systems Review of Systems  Constitutional:  Positive for fatigue. Negative for chills.  HENT:  Positive for congestion, rhinorrhea, sinus pressure, sinus pain and sore throat. Negative for ear pain.   Eyes:  Negative for pain and visual disturbance.  Respiratory:  Negative for cough, chest tightness, shortness of breath and wheezing.  Cardiovascular:  Negative for chest pain and palpitations.  Gastrointestinal:  Positive for abdominal pain. Negative for constipation, diarrhea, nausea and vomiting.  Genitourinary:  Negative for dysuria and hematuria.  Musculoskeletal:  Positive for arthralgias. Negative for back pain.  Skin:  Negative for color change and rash.  Neurological:  Positive for headaches. Negative for seizures and syncope.  All other systems reviewed and are negative.    Physical Exam Triage Vital Signs ED Triage Vitals  Encounter Vitals Group     BP 08/20/23 1040 127/74     Systolic BP Percentile --      Diastolic BP Percentile --      Pulse Rate 08/20/23 1040 63     Resp 08/20/23 1040 18     Temp 08/20/23 1040 98.6 F (37 C)     Temp Source 08/20/23 1040 Oral     SpO2 08/20/23 1040 100 %     Weight 08/20/23 1040 170 lb (77.1 kg)     Height 08/20/23 1040 5\' 6"  (1.676 m)     Head  Circumference --      Peak Flow --      Pain Score 08/20/23 1039 9     Pain Loc --      Pain Education --      Exclude from Growth Chart --    No data found.  Updated Vital Signs BP 127/74 (BP Location: Right Arm)   Pulse 63   Temp 98.6 F (37 C) (Oral)   Resp 18   Ht 5\' 6"  (1.676 m)   Wt 170 lb (77.1 kg)   LMP 08/16/2023 (Exact Date)   SpO2 100%   BMI 27.44 kg/m   Visual Acuity Right Eye Distance:   Left Eye Distance:   Bilateral Distance:    Right Eye Near:   Left Eye Near:    Bilateral Near:     Physical Exam Constitutional:      Appearance: Normal appearance.  HENT:     Head: Normocephalic.     Right Ear: Hearing and external ear normal. A middle ear effusion is present. There is impacted cerumen (Noted in the canal, but not obstructive). Tympanic membrane is erythematous and bulging. Tympanic membrane is not retracted.     Left Ear: Hearing and external ear normal.  No middle ear effusion. There is impacted cerumen (Noted in the canal, but not obstructive). Tympanic membrane is erythematous. Tympanic membrane is not retracted or bulging.     Nose: Mucosal edema, congestion and rhinorrhea present.     Right Turbinates: Swollen.     Left Turbinates: Swollen.     Right Sinus: No maxillary sinus tenderness or frontal sinus tenderness.     Left Sinus: No maxillary sinus tenderness or frontal sinus tenderness.     Mouth/Throat:     Mouth: Mucous membranes are moist.     Pharynx: Uvula midline. Posterior oropharyngeal erythema (Mild) present. No oropharyngeal exudate.     Tonsils: No tonsillar exudate or tonsillar abscesses.  Eyes:     General: Lids are normal. Vision grossly intact.     Conjunctiva/sclera: Conjunctivae normal.     Pupils: Pupils are equal, round, and reactive to light.  Cardiovascular:     Rate and Rhythm: Normal rate and regular rhythm.     Heart sounds: Normal heart sounds, S1 normal and S2 normal.  Pulmonary:     Effort: Pulmonary effort is  normal.     Breath sounds: Normal breath sounds.  Abdominal:  General: Abdomen is flat. Bowel sounds are normal.     Palpations: Abdomen is soft.     Tenderness: There is generalized abdominal tenderness (Mild).  Musculoskeletal:     Cervical back: Normal range of motion and neck supple.  Lymphadenopathy:     Head:     Right side of head: Posterior auricular adenopathy present. No submandibular or preauricular adenopathy.     Left side of head: Posterior auricular adenopathy present. No submandibular or preauricular adenopathy.     Cervical: Cervical adenopathy present.     Right cervical: Superficial cervical adenopathy present.     Left cervical: Superficial cervical adenopathy present.  Skin:    General: Skin is warm and dry.     Findings: No rash.  Neurological:     Mental Status: She is alert and oriented to person, place, and time.     Gait: Gait normal.  Psychiatric:        Mood and Affect: Mood normal.        Behavior: Behavior normal.      UC Treatments / Results  Labs (all labs ordered are listed, but only abnormal results are displayed) Labs Reviewed  POC COVID19/FLU A&B COMBO    EKG   Radiology No results found.  Procedures Procedures (including critical care time)  Medications Ordered in UC Medications - No data to display  Initial Impression / Assessment and Plan / UC Course  I have reviewed the triage vital signs and the nursing notes.  Pertinent labs & imaging results that were available during my care of the patient were reviewed by me and considered in my medical decision making (see chart for details).  Otitis media: Exam showed right otitis media with effusion and mild early left otitis media.  Will treat with cefdinir 300 mg, 1 twice a day, for 5 days.  Encouraged to use Mucinex DM, OTC, as directed on the package, for head congestion and ear congestion.  Cough, URI: Exam shows findings consistent with a viral upper respiratory infection.   COVID and flu testing are negative.  Get good fluids, rest, may use over-the-counter decongestants.  Given a work excuse for today to return on 08/22/2023.  Return here if symptoms do not improve, worsen or if new symptoms occur. Final Clinical Impressions(s) / UC Diagnoses   Final diagnoses:  Acute cough  Otitis media with effusion, right  Left otitis media, unspecified otitis media type  Upper respiratory tract infection, unspecified type     Discharge Instructions      Exam showed right and left ear infections with some fluid behind the right eardrum.  Encouraged Mucinex DM (use as directed on the package) to help clear the fluid behind the eardrum.  Cefdinir 300 mg, #1, twice daily after meals for 7 days for ear infection.  Influenza and Covid Tests were negative.  Return here if symptoms do not improve, worsen or if new symptoms occur.     ED Prescriptions     Medication Sig Dispense Auth. Provider   dextromethorphan-guaiFENesin (MUCINEX DM) 30-600 MG 12hr tablet Take 1 tablet by mouth 2 (two) times daily as needed for up to 7 days for cough (or head congestion). 14 tablet Prescilla Sours, FNP   cefdinir (OMNICEF) 300 MG capsule Take 1 capsule (300 mg total) by mouth 2 (two) times daily after a meal for 7 days. 14 capsule Prescilla Sours, FNP      PDMP not reviewed this encounter.   Prescilla Sours, FNP 08/20/23 1136

## 2023-08-29 ENCOUNTER — Ambulatory Visit (INDEPENDENT_AMBULATORY_CARE_PROVIDER_SITE_OTHER): Payer: MEDICAID | Admitting: Primary Care

## 2023-09-06 ENCOUNTER — Ambulatory Visit (INDEPENDENT_AMBULATORY_CARE_PROVIDER_SITE_OTHER): Payer: MEDICAID | Admitting: Primary Care

## 2023-09-06 ENCOUNTER — Encounter (INDEPENDENT_AMBULATORY_CARE_PROVIDER_SITE_OTHER): Payer: Self-pay | Admitting: Primary Care

## 2023-09-06 VITALS — BP 131/82 | HR 72 | Resp 16 | Ht 67.0 in | Wt 190.4 lb

## 2023-09-06 DIAGNOSIS — R030 Elevated blood-pressure reading, without diagnosis of hypertension: Secondary | ICD-10-CM

## 2023-09-06 DIAGNOSIS — F32A Depression, unspecified: Secondary | ICD-10-CM | POA: Diagnosis not present

## 2023-09-06 DIAGNOSIS — Z2821 Immunization not carried out because of patient refusal: Secondary | ICD-10-CM

## 2023-09-06 DIAGNOSIS — F419 Anxiety disorder, unspecified: Secondary | ICD-10-CM | POA: Diagnosis not present

## 2023-09-06 MED ORDER — CITALOPRAM HYDROBROMIDE 20 MG PO TABS: 20.0000 mg | ORAL_TABLET | Freq: Every day | ORAL | 1 refills | Status: DC

## 2023-09-06 NOTE — Progress Notes (Signed)
   Subjective:  [name] is a 33 y.o. female presents for ED follow up presented on 08/20/23 with c/o nasal congestion and headaches.  Dx with Acute cough Otitis media with effusion, right Left otitis media, unspecified otitis media type to Upper respiratory tract infection, unspecified type. Today she feels good no c/o. Past Medical History:  Diagnosis Date   Herpes genitalia    Migraines    Trichimoniasis    Yeast infection      No Known Allergies  No current outpatient medications on file prior to visit.   No current facility-administered medications on file prior to visit.    Review of System: ROS Comprehensive ROS Pertinent positive and negative noted in HPI   Objective:  BP 131/82   Pulse 72   Resp 16   Ht 5\' 7"  (1.702 m)   Wt 190 lb 6.4 oz (86.4 kg)   LMP 08/16/2023 (Exact Date)   SpO2 98%   BMI 29.82 kg/m   Filed Weights   09/06/23 1602  Weight: 190 lb 6.4 oz (86.4 kg)    Physical Exam Vitals reviewed.  Constitutional:      Appearance: Normal appearance.  HENT:     Head: Normocephalic.     Right Ear: Tympanic membrane, ear canal and external ear normal.     Left Ear: Tympanic membrane, ear canal and external ear normal.     Nose: Nose normal.     Mouth/Throat:     Mouth: Mucous membranes are moist.  Eyes:     Extraocular Movements: Extraocular movements intact.     Pupils: Pupils are equal, round, and reactive to light.  Cardiovascular:     Rate and Rhythm: Normal rate.  Pulmonary:     Effort: Pulmonary effort is normal.     Breath sounds: Normal breath sounds.  Abdominal:     General: Bowel sounds are normal.     Palpations: Abdomen is soft.  Musculoskeletal:        General: Normal range of motion.     Cervical back: Normal range of motion.  Skin:    General: Skin is warm and dry.  Neurological:     Mental Status: She is alert and oriented to person, place, and time.  Psychiatric:        Mood and Affect: Mood normal.         Behavior: Behavior normal.        Thought Content: Thought content normal.      Assessment:  Jeanne Chaney was seen today for hospitalization follow-up.  Diagnoses and all orders for this visit:  Influenza vaccination declined  Anxiety and depression -     citalopram (CELEXA) 20 MG tablet; Take 1 tablet (20 mg total) by mouth daily.  Elevated blood pressure reading in office without diagnosis of hypertension     This note has been created with Education officer, environmental. Any transcriptional errors are unintentional.   Return in about 3 months (around 12/05/2023) for medical conditions.  [name], NP 09/10/2023, 8:15 PM

## 2023-09-06 NOTE — Patient Instructions (Signed)
Preventing Hypertension Hypertension, also called high blood pressure, is when the force of blood pumping through the arteries is too strong. Arteries are blood vessels that carry blood from the heart throughout the body. Often, hypertension does not cause symptoms until blood pressure is very high. It is important to have your blood pressure checked regularly. Diet and lifestyle changes can help you prevent hypertension, and they may make you feel better overall and improve your quality of life. If you already have hypertension, you may control it with diet and lifestyle changes, as well as with medicine. How can this condition affect me? Over time, hypertension can damage the arteries and decrease blood flow to important parts of the body, including the brain, heart, and kidneys. By keeping your blood pressure in a healthy range, you can help prevent complications like heart attack, heart failure, stroke, kidney failure, and vascular dementia. What can increase my risk? An unhealthy diet and a lack of physical activity can make you more likely to develop high blood pressure. Some other risk factors include: Age. The risk increases with age. Having family members who have had high blood pressure. Having certain health conditions, such as thyroid problems. Being overweight or obese. Drinking too much alcohol or caffeine. Having too much fat, sugar, calories, or salt (sodium) in your diet. Smoking or using illegal drugs. Taking certain medicines, such as antidepressants, decongestants, birth control pills, and NSAIDs, such as ibuprofen. What actions can I take to prevent or manage this condition? Work with your health care provider to make a hypertension prevention plan that works for you. You may be referred for counseling on a healthy diet and physical activity. Follow your plan and keep all follow-up visits. Diet changes Maintain a healthy diet. This includes: Eating less salt (sodium). Ask your  health care provider how much sodium is safe for you to have. The general recommendation is to have less than 1 tsp (2,300 mg) of sodium a day. Do not add salt to your food. Choose low-sodium options when grocery shopping and eating out. Limiting fats in your diet. You can do this by eating low-fat or fat-free dairy products and by eating less red meat. Eating more fruits, vegetables, and whole grains. Make a goal to eat: 1-2 cups of fresh fruits and vegetables each day. 3-4 servings of whole grains each day. Avoiding foods and beverages that have added sugars. Eating fish that contain healthy fats (omega-3 fatty acids), such as mackerel or salmon. If you need help putting together a healthy eating plan, try the DASH diet. This diet is high in fruits, vegetables, and whole grains. It is low in sodium, red meat, and added sugars. DASH stands for Dietary Approaches to Stop Hypertension. Lifestyle changes  Lose weight if you are overweight. Losing just 3-5% of your body weight can help prevent or control hypertension. For example, if your present weight is 200 lb (91 kg), a loss of 3-5% of your weight means losing 6-10 lb (2.7-4.5 kg). Ask your health care provider to help you with a diet and exercise plan to safely lose weight. Get enough exercise. Do at least 150 minutes of moderate-intensity exercise each week. You could do this in short exercise sessions several times a day, or you could do longer exercise sessions a few times a week. For example, you could take a brisk 10-minute walk or bike ride, 3 times a day, for 5 days a week. Find ways to reduce stress, such as exercising, meditating, listening to   music, or taking a yoga class. If you need help reducing stress, ask your health care provider. Do not use any products that contain nicotine or tobacco. These products include cigarettes, chewing tobacco, and vaping devices, such as e-cigarettes. Chemicals in tobacco and nicotine products raise your  blood pressure each time you use them. If you need help quitting, ask your health care provider. Learn how to check your blood pressure at home. Make sure that you know your personal target blood pressure, as told by your health care provider. Try to sleep 7-9 hours per night. Alcohol use Do not drink alcohol if: Your health care provider tells you not to drink. You are pregnant, may be pregnant, or are planning to become pregnant. If you drink alcohol: Limit how much you have to: 0-1 drink a day for women. 0-2 drinks a day for men. Know how much alcohol is in your drink. In the [place] one drink equals one 12 oz bottle of beer (355 mL), one 5 oz glass of wine (148 mL), or one 1 oz glass of hard liquor (44 mL). Medicines In addition to diet and lifestyle changes, your health care provider may recommend medicines to help lower your blood pressure. In general: You may need to try a few different medicines to find what works best for you. You may need to take more than one medicine. Take over-the-counter and prescription medicines only as told by your health care provider. Questions to ask your health care provider What is my blood pressure goal? How can I lower my risk for high blood pressure? How should I monitor my blood pressure at home? Where to find support Your health care provider can help you prevent hypertension and help you keep your blood pressure at a healthy level. Your local hospital or your community may also provide support services and prevention programs. The American Heart Association offers an online support network at supportnetwork.heart.org Where to find more information Learn more about hypertension from: National Heart, Lung, and Blood Institute: www.nhlbi.nih.gov Centers for Disease Control and Prevention: www.cdc.gov American Academy of Family Physicians: familydoctor.org Learn more about the DASH diet from: National Heart, Lung, and Blood Institute:  www.nhlbi.nih.gov Contact a health care provider if: You think you are having a reaction to medicines you have taken. You have recurrent headaches or feel dizzy. You have swelling in your ankles. You have trouble with your vision. Get help right away if: You have sudden, severe chest, back, or abdominal pain or discomfort. You have shortness of breath. You have a sudden, severe headache. These symptoms may be an emergency. Get help right away. Call 911. Do not wait to see if the symptoms will go away. Do not drive yourself to the hospital. Summary Hypertension often does not cause any symptoms until blood pressure is very high. It is important to get your blood pressure checked regularly. Diet and lifestyle changes are important steps in preventing hypertension. By keeping your blood pressure in a healthy range, you may prevent complications like heart attack, heart failure, stroke, and kidney failure. Work with your health care provider to make a hypertension prevention plan that works for you. This information is not intended to replace advice given to you by your health care provider. Make sure you discuss any questions you have with your health care provider. Document Revised: 05/26/2021 Document Reviewed: 05/26/2021 Elsevier Patient Education  2024 Elsevier Inc.  

## 2023-09-10 ENCOUNTER — Encounter: Payer: Self-pay | Admitting: Podiatry

## 2023-09-10 ENCOUNTER — Ambulatory Visit (INDEPENDENT_AMBULATORY_CARE_PROVIDER_SITE_OTHER): Payer: MEDICAID | Admitting: Podiatry

## 2023-09-10 VITALS — Ht 67.0 in | Wt 190.0 lb

## 2023-09-10 DIAGNOSIS — D2372 Other benign neoplasm of skin of left lower limb, including hip: Secondary | ICD-10-CM

## 2023-09-10 DIAGNOSIS — D492 Neoplasm of unspecified behavior of bone, soft tissue, and skin: Secondary | ICD-10-CM | POA: Diagnosis not present

## 2023-09-10 DIAGNOSIS — M79675 Pain in left toe(s): Secondary | ICD-10-CM

## 2023-09-10 NOTE — Progress Notes (Signed)
      Chief Complaint  Patient presents with   Callouses    "She has noticed a callus on the 4th toe next to the pinky"   HPI: 33 y.o. female presents today with concern of a painful growth underneath the left fourth toe.  She states that it keeps coming back and has been coming back quicker.  Notes that her primary care physician shaved the lesion recently and had full regrowth within 2 weeks.  She notes very tender.  She has no other lesions similar to this anywhere else on her body.  Notes that she saw her PCP recently for a checkup and there were no concerns.  Notes that she does not smoke and takes minimal to no medications on a regular basis.  Past Medical History:  Diagnosis Date   Herpes genitalia    Migraines    Trichimoniasis    Yeast infection     History reviewed. No pertinent surgical history.  No Known Allergies   Physical Exam: There is a hard and thick hyperkeratotic lesion growing vertically away from the skin on the plantar lateral aspect of the left fourth toe near the sulcus.  There is pain on palpation to the area.  There is no edema to the toe.  No surrounding erythema is noted.  No drainage is noted.  There are palpable pedal pulses noted.  Epicritic sensation is intact.  Assessment/Plan of Care: 1. Skin neoplasm   2. Benign neoplasm of skin of left foot   3. Pain in left toe(s)    Discussed clinical findings with patient today.  Informed the patient I am concerned that this could possibly be a wart and not simply a corn due to the fact it grew back so quickly and is not in the typical high-pressure area.  This could be some other benign or premalignant lesion that should be cut out to the level of subcutaneous tissue and then the either cauterized or CO2 laser utilized on the remaining wound bed to prevent any recurrence.  As she would rather continue with this recommended treatment plan versus having the toe anesthetized today and just shaving the lesion which  has a high likelihood of recurring quickly.  Benefits, risks, and possible postoperative complications were discussed with the patient regarding having this surgically excised.  Also discussed issues if she opts not to have any surgical care which would include recurrence and continued pain.  All questions were answered today.  Discussed outpatient procedure at [facility] under intravenous sedation with local anesthesia.  I will try to get this scheduled within the next 2 weeks.  Discussed the expected postoperative course with a surgical shoe and dressing to be worn.  F/u for surgery.  Clerance Lav, DPM, FACFAS [facility]     [address] [place], [place] [address]                Office [phone]  Fax [phone]

## 2023-09-11 ENCOUNTER — Telehealth: Payer: Self-pay | Admitting: Podiatry

## 2023-09-11 NOTE — Telephone Encounter (Signed)
DOS-09/25/23  EXC BENIGN LESION 4TH [id number]  [facility] EFFECTIVE DATE- 09/10/23  SPOKE WITH KAYLA H. FROM [facility] AND SHE STATED THAT PRIOR AUTH IS NOT REQUIRED FOR CPT CODE 86578.  CALL REF #: [id number]

## 2023-09-25 DIAGNOSIS — D492 Neoplasm of unspecified behavior of bone, soft tissue, and skin: Secondary | ICD-10-CM

## 2023-09-25 HISTORY — PX: OTHER SURGICAL HISTORY: SHX169

## 2023-09-25 MED ORDER — HYDROCODONE-ACETAMINOPHEN 5-325 MG PO TABS: ORAL_TABLET | ORAL | 0 refills | Status: DC

## 2023-09-25 NOTE — Addendum Note (Signed)
Addended byLucia Estelle D on: 09/25/2023 08:05 AM   Modules accepted: Orders

## 2023-09-25 NOTE — Telephone Encounter (Signed)
Post-op meds sent to patient's pharmacy on file for today's surgery

## 2023-10-01 ENCOUNTER — Ambulatory Visit (INDEPENDENT_AMBULATORY_CARE_PROVIDER_SITE_OTHER): Payer: MEDICAID | Admitting: Podiatry

## 2023-10-01 ENCOUNTER — Encounter: Payer: Self-pay | Admitting: Podiatry

## 2023-10-01 DIAGNOSIS — Z9889 Other specified postprocedural states: Secondary | ICD-10-CM

## 2023-10-01 MED ORDER — HYDROCODONE-ACETAMINOPHEN 5-325 MG PO TABS: ORAL_TABLET | ORAL | 0 refills | Status: DC

## 2023-10-01 NOTE — Progress Notes (Signed)
      Chief Complaint  Patient presents with   Routine Post Op    POV # 1 DOS 09/25/23 --- EXCISION OF PAINFUL SKIN LESION FROM LEFT 4TH TOE WITH CO2 LASER TREATMENT   HPI: 33 y.o. female presents today for first p/o appt. patient had excision of a painful skin lesion from the plantar aspect of the left fourth toe 1 week ago.  Denies fever, chills, nausea/vomiting.  She does note that she is in moderate pain.  She took her last pain pill this morning.  She has been wearing the surgical shoe for the majority of the time.  She does not wear when sleeping but was also not instructed to.  She has been able to keep the dressing clean dry and intact.  We do not have the pathology report back yet from surgery  Past Medical History:  Diagnosis Date   Herpes genitalia    Migraines    Trichimoniasis    Yeast infection     No past surgical history on file.  No Known Allergies    Physical Exam: Palpable pedal pulses noted left foot.  Minimal localized edema near the fourth and fifth toes.  The sutures are intact reinforcing the incision area to help with healing.  No significant drainage is noted on the dressing.  There is slight maceration along the incision edges.  No purulence or clinical signs of infection are noted.  There is pain on palpation of the area  Assessment/Plan of Care: 1. Post-operative state      Meds ordered this encounter  Medications   HYDROcodone -acetaminophen  (NORCO/VICODIN) 5-325 MG tablet    Sig: Take one to two tablets every six to eight hours as needed for moderate - severe pain.    Dispense:  25 tablet    Refill:  0   Discussed clinical findings with patient today.  Iodine solution followed by a dry sterile gauze dressing was applied to the left forefoot followed by an Ace wrap dressing.  She can remove this tomorrow and start showering.  She will need to rewrap it.  I instructed the patient to use iodine to the area with either a Q-tip or cottonball and then wrap  with gauze to the point of comfort.  Continue with elevation and icing as needed.  Will renew her hydrocodone /acetaminophen  due to her pain level.  Patient requested a note to be out of work from the day of surgery until next appointment.  Informed the patient she might be able to return to work approximately 3 weeks from the surgery date.  She works in housekeeping   Joangel Vanosdol DEstle Hemp, DPM, FACFAS [facility]     [address] [place], [place] [address]                Office [phone]  Fax [phone]

## 2023-10-03 ENCOUNTER — Telehealth: Payer: Self-pay | Admitting: Podiatry

## 2023-10-03 NOTE — Telephone Encounter (Signed)
Pt states she would like a letter for her employer stating her surgery date, procedure performed, and her return to work date.

## 2023-10-08 ENCOUNTER — Encounter: Payer: Self-pay | Admitting: Podiatry

## 2023-10-08 ENCOUNTER — Ambulatory Visit (INDEPENDENT_AMBULATORY_CARE_PROVIDER_SITE_OTHER): Payer: MEDICAID | Admitting: Podiatry

## 2023-10-08 DIAGNOSIS — Z9889 Other specified postprocedural states: Secondary | ICD-10-CM

## 2023-10-08 DIAGNOSIS — D492 Neoplasm of unspecified behavior of bone, soft tissue, and skin: Secondary | ICD-10-CM

## 2023-10-08 NOTE — Progress Notes (Signed)
       Chief Complaint  Patient presents with   Routine Post Op    POV # 2 DOS 09/25/23 --- EXCISION OF SKIN LESION FROM LEFT 4TH TOE Pt stated that she has some pain she thinks it is coming from the stiches no other concerns at this time     HPI: 33 y.o. female presents today for 2nd p/o visit.  She still has tenderness to the surgical area.  Has been keeping it covered with gauze dressing. Denies F/C/NS/N/V  Past Medical History:  Diagnosis Date   Herpes genitalia    Migraines    Trichimoniasis    Yeast infection    No past surgical history on file.  No Known Allergies   Physical Exam: Palpable pedal pulses left foot.  Maceration of left 4th interspace where excision was performed.  No clinical signs of infection noted.  No purulence.  Sutures intact  Assessment/Plan of Care: 1. Post-operative state   2. Skin neoplasm     Discussed clinical findings with patient today.  Sutures were removed.  Betadine and DSD applied.  Patient may begin changing tomorrow and performing Epsom salt soaks once or twice daily for 15 minutes at a time.  Dry well between toes before redressing with betadine and dressing of her choosing.  She may return to shoes as tolerated.    F/u 2 weeks.  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    Clerance Lav, DPM, FACFAS [facility]     [address] [place], [place] [address]                Office [phone]  Fax [phone]

## 2023-10-15 ENCOUNTER — Telehealth: Payer: Self-pay | Admitting: Podiatry

## 2023-10-15 NOTE — Telephone Encounter (Signed)
 Per message from provider, letter was requested for patient's employer stating the surgery date, RTW date and follow up appt date.  Found that this letter was already sent to the patient's MyChart 02/17.  Called patient @ [phone] to confirm if letter is still needed ....  Ph# would not go thru - tried twice ....     J. Abbott -- 10/15/2023

## 2023-10-22 ENCOUNTER — Ambulatory Visit (INDEPENDENT_AMBULATORY_CARE_PROVIDER_SITE_OTHER): Payer: MEDICAID | Admitting: Podiatry

## 2023-10-22 ENCOUNTER — Encounter: Payer: Self-pay | Admitting: Podiatry

## 2023-10-22 DIAGNOSIS — Z9889 Other specified postprocedural states: Secondary | ICD-10-CM

## 2023-10-22 NOTE — Progress Notes (Signed)
       Subjective:  Patient ID: Jeanne Chaney, female    DOB: May 08, 1991,  MRN: [id number]  Jeanne Chaney presents to clinic today for:  Chief Complaint  Patient presents with   Routine Post Op    3 DOS 09/25/23 --- EXCISION OF PAINFUL SKIN LESION FROM LEFT 4TH TOE WITH CO2 LASER TREATMENT  Pt stated that she is doing okay she does have some discomfort    Patient is approximately 4 weeks postop after undergoing a excision of wart on plantar lateral aspect of the left fourth toe near the interspace..  Patient denies fever, chills, night sweats, nausea/vomiting.  Patient denies chest pain or shortness of breath.  Patient denies calf pain.  As she has a little discomfort to the area now.  She has been applying the iodine to the area followed by a gauze wrapping from but also has been applying some triple antibiotic ointment/Neosporin to the area.  She is ready to return to work tomorrow  Past Medical History:  Diagnosis Date   Herpes genitalia    Migraines    Trichimoniasis    Yeast infection     No past surgical history on file.  No Known Allergies  Objective:   Physical Examination: There are palpable pedal pulses.  There is localized edema to the surgical area.  The excised area is macerated.  This has granulated in and is smaller than last visit.  The base is granular.  Minimal discomfort on palpation.  No clinical signs of infection are seen  Assessment/Plan: 1. Post-operative state     Betadine solution followed by a gauze dressing was applied to the excised area.  Patient will DC any type of antibiotic ointment other than the iodine solution followed by a gauze wrapping.  She will continue with this daily for at least 1 - 1 1/2 more weeks.  The front desk will provide her a return to work note starting tomorrow.  Return in about 3 weeks (around 11/12/2023) for post-op recheck.   Clerance Lav, DPM, FACFAS [facility]     [address] [place], [place] [address]                Office [phone]  Fax [phone]

## 2023-10-24 ENCOUNTER — Telehealth: Payer: Self-pay | Admitting: Podiatry

## 2023-10-24 NOTE — Telephone Encounter (Signed)
 Patient was given a note that said can do light duty.  She needs a note that states no restriction.  I didn't want to make that decision.

## 2023-10-25 ENCOUNTER — Encounter: Payer: Self-pay | Admitting: Podiatry

## 2023-10-25 ENCOUNTER — Telehealth: Payer: Self-pay | Admitting: Podiatry

## 2023-10-25 NOTE — Telephone Encounter (Signed)
 Patient says her job wont let her work light duty and that she needs a note for work saying she needs to work with no restrictions.

## 2023-10-25 NOTE — Telephone Encounter (Signed)
 Pt called again checking on the note she is needing to go back to work with no restrictions. I did explain that the provider is in clinic and will get back to her as soon as she can.

## 2023-11-12 ENCOUNTER — Ambulatory Visit (INDEPENDENT_AMBULATORY_CARE_PROVIDER_SITE_OTHER): Payer: MEDICAID | Admitting: Podiatry

## 2023-11-12 ENCOUNTER — Ambulatory Visit (HOSPITAL_COMMUNITY)
Admission: EM | Admit: 2023-11-12 | Discharge: 2023-11-12 | Disposition: A | Discharge status: Home / Self Care | Payer: MEDICAID | Attending: Family Medicine | Admitting: Family Medicine

## 2023-11-12 ENCOUNTER — Encounter (HOSPITAL_COMMUNITY): Payer: Self-pay

## 2023-11-12 DIAGNOSIS — Z9889 Other specified postprocedural states: Secondary | ICD-10-CM

## 2023-11-12 DIAGNOSIS — R21 Rash and other nonspecific skin eruption: Secondary | ICD-10-CM

## 2023-11-12 DIAGNOSIS — W57XXXA Bitten or stung by nonvenomous insect and other nonvenomous arthropods, initial encounter: Secondary | ICD-10-CM

## 2023-11-12 DIAGNOSIS — L989 Disorder of the skin and subcutaneous tissue, unspecified: Secondary | ICD-10-CM | POA: Diagnosis not present

## 2023-11-12 MED ORDER — IBUPROFEN 400 MG PO TABS: 400.0000 mg | ORAL_TABLET | Freq: Three times a day (TID) | ORAL | 0 refills | Status: DC | PRN

## 2023-11-12 MED ORDER — DIPHENHYDRAMINE HCL 25 MG PO TABS: 25.0000 mg | ORAL_TABLET | Freq: Three times a day (TID) | ORAL | 0 refills | Status: DC | PRN

## 2023-11-12 MED ORDER — PREDNISONE 20 MG PO TABS: 40.0000 mg | ORAL_TABLET | Freq: Every day | ORAL | 0 refills | Status: AC

## 2023-11-12 MED ORDER — MUPIROCIN CALCIUM 2 % EX CREA: 1.0000 | TOPICAL_CREAM | Freq: Two times a day (BID) | CUTANEOUS | 0 refills | Status: DC

## 2023-11-12 NOTE — Progress Notes (Unsigned)
       Subjective:  Patient ID: Jeanne Chaney, female    DOB: July 06, 1991,  MRN: [id number]  [name] presents to clinic today for: Patient is 7 weeks postop after undergoing excision of painful wart from plantarlateral left 4th toe .  Patient denies fever, chills, night sweats, nausea/vomiting.  Patient denies chest pain or shortness of breath.  Patient denies calf pain.  She thinks the skin has healed but isn't sure since she can't see that part of her toe.  Another person is with the patient today.    Past Medical History:  Diagnosis Date   Herpes genitalia    Migraines    Trichimoniasis    Yeast infection     Past Surgical History:  Procedure Laterality Date   excision of wart Left 09/25/2023   left 4th toe    No Known Allergies  Objective:  Physical Examination: There are palpable pedal pulses.  The excised wart has granulated in well and new skin has formed.  No open wound/lesion noted now.  Some dry skin present in area.  No clinical signs of infection.  No pain on palpation of area.    Assessment/Plan: 1. Post-operative state     Patient to massage scar cream twice weekly to the excised wart area.  Return to all pre-surgical activities now.    F/u prn    Return if symptoms worsen or fail to improve.   Clerance Lav, DPM, FACFAS [facility]     [address] [place], [place] [address]                Office [phone]  Fax [phone]

## 2023-11-12 NOTE — ED Triage Notes (Signed)
 Pt c/o possible bug bite to lt buttocks, rt hip, and rt inner thigh x2-3 days. C/o pain.

## 2023-11-12 NOTE — ED Provider Notes (Signed)
 [facility]    CSN: [id number] Arrival date & time: 11/12/23  1715      History   Chief Complaint Chief Complaint  Patient presents with   Insect Bite    HPI Jeanne Chaney is a 33 y.o. female.   The history is provided by the patient. No language interpreter was used.  Animal Bite Contact animal:  Insect Time since incident:  3 days Pain details:    Quality:  Itching and aching (She noticed itchy bump on her left butt cheek 1 week ago and then two new ones about 2-3 days on her right thigh)   Pain severity now: pain is about 10/10 in severity.   Progression:  Worsening Relieved by:  Nothing Worsened by:  Nothing Associated symptoms: no fever, no numbness and no rash     Past Medical History:  Diagnosis Date   Herpes genitalia    Migraines    Trichimoniasis    Yeast infection     Patient Active Problem List   Diagnosis Date Noted   Curly toe, acquired, left 02/23/2023   Tinea pedis of both feet 02/23/2023   Corn of toe 02/23/2023   Severe episode of recurrent major depressive disorder, without psychotic features (HCC) 03/30/2021   Vaginal irritation 04/30/2017   Herpes genitalis in women 10/06/2016   Pap smear for cervical cancer screening 10/06/2016    History reviewed. No pertinent surgical history.  OB History     Gravida  2   Para  2   Term  2   Preterm      AB      Living  2      SAB      IAB      Ectopic      Multiple      Live Births  2            Home Medications    Prior to Admission medications   Medication Sig Start Date End Date Taking? Authorizing Provider  diphenhydrAMINE (BENADRYL) 25 MG tablet Take 1 tablet (25 mg total) by mouth every 8 (eight) hours as needed for itching. 11/12/23  Yes Doreene Eland, MD  ibuprofen (ADVIL) 400 MG tablet Take 1 tablet (400 mg total) by mouth every 8 (eight) hours as needed. 11/12/23  Yes Doreene Eland, MD  mupirocin cream (BACTROBAN) 2 % Apply 1 Application  topically 2 (two) times daily. 11/12/23  Yes Doreene Eland, MD  predniSONE (DELTASONE) 20 MG tablet Take 2 tablets (40 mg total) by mouth daily for 3 days. 11/12/23 11/15/23 Yes Doreene Eland, MD  citalopram (CELEXA) 20 MG tablet Take 1 tablet (20 mg total) by mouth daily. 09/06/23   [name], NP    Family History Family History  Problem Relation Age of Onset   Asthma Mother     Social History Social History   Tobacco Use   Smoking status: Never   Smokeless tobacco: Never  Vaping Use   Vaping status: Never Used  Substance Use Topics   Alcohol use: No   Drug use: Yes    Types: Marijuana     Allergies   Patient has no known allergies.   Review of Systems Review of Systems  Constitutional:  Negative for fever.  Respiratory: Negative.    Cardiovascular: Negative.   Skin:  Negative for rash.  Neurological:  Negative for numbness.  All other systems reviewed and are negative.    Physical Exam Triage  Vital Signs ED Triage Vitals [11/12/23 1758]  Encounter Vitals Group     BP 129/77     Systolic BP Percentile      Diastolic BP Percentile      Pulse Rate 74     Resp 18     Temp 98.7 F (37.1 C)     Temp Source Oral     SpO2 99 %     Weight      Height      Head Circumference      Peak Flow      Pain Score 10     Pain Loc      Pain Education      Exclude from Growth Chart    No data found.  Updated Vital Signs BP 129/77 (BP Location: Left Arm)   Pulse 74   Temp 98.7 F (37.1 C) (Oral)   Resp 18   LMP 11/05/2023 (Exact Date)   SpO2 99%   Visual Acuity Right Eye Distance:   Left Eye Distance:   Bilateral Distance:    Right Eye Near:   Left Eye Near:    Bilateral Near:     Physical Exam Skin:    Comments: Caperone offered, but she said she could quickly show me her lesion without CHaperone. Dry, non-erythematous, healing nodules on her left butt/thigh area posteriorly. Two small, mildly erythematous, non-tender nodules on her  right thigh.      UC Treatments / Results  Labs (all labs ordered are listed, but only abnormal results are displayed) Labs Reviewed - No data to display  EKG   Radiology No results found.  Procedures Procedures (including critical care time)  Medications Ordered in UC Medications - No data to display  Initial Impression / Assessment and Plan / UC Course  I have reviewed the triage vital signs and the nursing notes.  Pertinent labs & imaging results that were available during my care of the patient were reviewed by me and considered in my medical decision making (see chart for details).  Clinical Course as of 11/12/23 1843  Mon Nov 12, 2023  1843 Skin lesion  Secondary to insect bites No signs of systemic infection Benadryl prn itching, Ibuprofen prn pain and prednisone e-scribed Mupirocin escribed to prevent super imposed bacterial infection Return precaution discussed [KE]    Clinical Course User Index [KE] Doreene Eland, MD     Final Clinical Impressions(s) / UC Diagnoses   Final diagnoses:  Insect bite, unspecified site, initial encounter  Rash  Skin lesion     Discharge Instructions      It was nice seeing you. I am sorry about your insect bite bumps. It looks like the one on your right butt cheek healed well. I have sent in antihistamine and prednisone for the new bite. Use Ibuprofen as needed for pain. I also sent in topical antibiotic cream to use on the bumps. See [place] soon if there is no improvement. Stay well.      ED Prescriptions     Medication Sig Dispense Auth. Provider   diphenhydrAMINE (BENADRYL) 25 MG tablet Take 1 tablet (25 mg total) by mouth every 8 (eight) hours as needed for itching. 30 tablet Janit Pagan T, MD   predniSONE (DELTASONE) 20 MG tablet Take 2 tablets (40 mg total) by mouth daily for 3 days. 6 tablet Janit Pagan T, MD   mupirocin cream (BACTROBAN) 2 % Apply 1 Application topically 2 (two) times daily. 15 g  Shriyan Arakawa,  Theador Hawthorne, MD   ibuprofen (ADVIL) 400 MG tablet Take 1 tablet (400 mg total) by mouth every 8 (eight) hours as needed. 20 tablet Doreene Eland, MD      PDMP not reviewed this encounter.   Doreene Eland, MD 11/12/23 [phone]

## 2023-11-12 NOTE — Discharge Instructions (Signed)
 It was nice seeing you. I am sorry about your insect bite bumps. It looks like the one on your right butt cheek healed well. I have sent in antihistamine and prednisone for the new bite. Use Ibuprofen as needed for pain. I also sent in topical antibiotic cream to use on the bumps. See [place] soon if there is no improvement. Stay well.

## 2023-11-14 ENCOUNTER — Encounter: Payer: Self-pay | Admitting: Podiatry

## 2023-11-16 ENCOUNTER — Emergency Department (HOSPITAL_COMMUNITY)
Admission: EM | Admit: 2023-11-16 | Discharge: 2023-11-16 | Disposition: A | Discharge status: Home / Self Care | Payer: MEDICAID | Attending: Emergency Medicine | Admitting: Emergency Medicine

## 2023-11-16 ENCOUNTER — Other Ambulatory Visit: Payer: Self-pay

## 2023-11-16 ENCOUNTER — Emergency Department (HOSPITAL_COMMUNITY): Payer: MEDICAID

## 2023-11-16 ENCOUNTER — Encounter (HOSPITAL_COMMUNITY): Payer: Self-pay | Admitting: *Deleted

## 2023-11-16 DIAGNOSIS — X58XXXA Exposure to other specified factors, initial encounter: Secondary | ICD-10-CM | POA: Diagnosis not present

## 2023-11-16 DIAGNOSIS — S335XXA Sprain of ligaments of lumbar spine, initial encounter: Secondary | ICD-10-CM | POA: Insufficient documentation

## 2023-11-16 DIAGNOSIS — M545 Low back pain, unspecified: Secondary | ICD-10-CM | POA: Diagnosis present

## 2023-11-16 DIAGNOSIS — S39012A Strain of muscle, fascia and tendon of lower back, initial encounter: Secondary | ICD-10-CM

## 2023-11-16 LAB — URINALYSIS, W/ REFLEX TO CULTURE (INFECTION SUSPECTED)
Bilirubin Urine: NEGATIVE
Glucose, UA: NEGATIVE mg/dL
Hgb urine dipstick: NEGATIVE
Ketones, ur: 5 mg/dL — AB
Leukocytes,Ua: NEGATIVE
Nitrite: NEGATIVE
Protein, ur: 30 mg/dL — AB
Specific Gravity, Urine: 1.034 — ABNORMAL HIGH (ref 1.005–1.030)
pH: 5 (ref 5.0–8.0)

## 2023-11-16 LAB — PREGNANCY, URINE: Preg Test, Ur: NEGATIVE

## 2023-11-16 MED ORDER — METHOCARBAMOL 500 MG PO TABS: 500.0000 mg | ORAL_TABLET | Freq: Three times a day (TID) | ORAL | 0 refills | Status: DC | PRN

## 2023-11-16 MED ORDER — IBUPROFEN 800 MG PO TABS: 800.0000 mg | ORAL_TABLET | Freq: Three times a day (TID) | ORAL | 0 refills | Status: DC | PRN

## 2023-11-16 NOTE — ED Notes (Signed)
 Patient transported to X-ray

## 2023-11-16 NOTE — ED Provider Notes (Signed)
 Emergency Department Provider Note   I have reviewed the triage vital signs and the nursing notes.   HISTORY  Chief Complaint Back Pain   HPI Jeanne Chaney is a 33 y.o. female with past history reviewed below presents emergency department for evaluation of lower back.  No known injury.  No UTI symptoms.  Symptoms been going on for the past week.  No abdominal pain, vomiting, diarrhea.  No numbness or weakness in the legs.  No groin numbness.  No fevers.  Past Medical History:  Diagnosis Date   Herpes genitalia    Migraines    Trichimoniasis    Yeast infection     Review of Systems  Constitutional: No fever/chills Cardiovascular: Denies chest pain. Respiratory: Denies shortness of breath. Gastrointestinal: No abdominal pain.  No nausea, no vomiting Musculoskeletal: Positive for back pain. Skin: Negative for rash. Neurological: Negative HA.  ____________________________________________   PHYSICAL EXAM:  VITAL SIGNS: ED Triage Vitals  Encounter Vitals Group     BP 11/16/23 1626 (!) 124/91     Pulse Rate 11/16/23 1626 88     Resp 11/16/23 1626 16     Temp 11/16/23 1626 97.8 F (36.6 C)     Temp src --      SpO2 11/16/23 1626 98 %     Weight 11/16/23 1628 190 lb 0.6 oz (86.2 kg)     Height 11/16/23 1628 5\' 7"  (1.702 m)   Constitutional: Alert and oriented. Well appearing and in no acute distress. Eyes: Conjunctivae are normal. Head: Atraumatic. Nose: No congestion/rhinnorhea. Mouth/Throat: Mucous membranes are moist.  Neck: No stridor.   Cardiovascular: Normal rate, regular rhythm. Good peripheral circulation. Grossly normal heart sounds.   Respiratory: Normal respiratory effort.  No retractions. Lungs CTAB. Gastrointestinal: Soft and nontender. No distention.  Musculoskeletal: No lower extremity tenderness nor edema. No gross deformities of extremities. Neurologic:  Normal speech and language. No gross focal neurologic deficits are appreciated.  Skin:   Skin is warm, dry and intact. No rash noted  ____________________________________________   LABS (all labs ordered are listed, but only abnormal results are displayed)  Labs Reviewed  URINALYSIS, W/ REFLEX TO CULTURE (INFECTION SUSPECTED) - Abnormal; Notable for the following components:      Result Value   APPearance HAZY (*)    Specific Gravity, Urine 1.034 (*)    Ketones, ur 5 (*)    Protein, ur 30 (*)    Bacteria, UA RARE (*)    All other components within normal limits  PREGNANCY, URINE    ____________________________________________  RADIOLOGY  DG Lumbar Spine Complete Result Date: 11/16/2023 CLINICAL DATA:  Back pain EXAM: LUMBAR SPINE - COMPLETE 4+ VIEW COMPARISON:  None Available. FINDINGS: There is no evidence of lumbar spine fracture. No traumatic listhesis. Intervertebral disc spaces are maintained. IMPRESSION: Negative. Electronically Signed   By: Duanne Guess D.O.   On: 11/16/2023 18:54    ____________________________________________   PROCEDURES  Procedure(s) performed:   Procedures  None  ____________________________________________   INITIAL IMPRESSION / ASSESSMENT AND PLAN / ED COURSE  Pertinent labs & imaging results that were available during my care of the patient were reviewed by me and considered in my medical decision making (see chart for details).   This patient is Presenting for Evaluation of back pain, which does require a range of treatment options, and is a complaint that involves a high risk of morbidity and mortality.  The Differential Diagnoses includes but is not exclusive to musculoskeletal back  pain, renal colic, urinary tract infection, pyelonephritis, intra-abdominal causes of back pain, aortic aneurysm or dissection, cauda equina syndrome, sciatica, lumbar disc disease, thoracic disc disease, etc.  Clinical Laboratory Tests Ordered, included UA without infection.   Radiologic Tests Ordered, included Lumbar XR. I  independently interpreted the images and agree with radiology interpretation.   Medical Decision Making: Summary:  Patient presents emergency department lower back pain without trauma.  X-ray reassuring.  UA without infection.  Pregnancy negative.  Plan for MSK treatment.  Do not see indication for emergent MRI imaging of the spine.  Patient's presentation is most consistent with acute presentation with potential threat to life or bodily function.   Disposition: discharge  ____________________________________________  FINAL CLINICAL IMPRESSION(S) / ED DIAGNOSES  Final diagnoses:  Strain of lumbar region, initial encounter     NEW OUTPATIENT MEDICATIONS STARTED DURING THIS VISIT:  Discharge Medication List as of 11/16/2023  7:12 PM     START taking these medications   Details  methocarbamol (ROBAXIN) 500 MG tablet Take 1 tablet (500 mg total) by mouth every 8 (eight) hours as needed., Starting Fri 11/16/2023, Normal        Note:  This document was prepared using Dragon voice recognition software and may include unintentional dictation errors.  Alona Bene, MD, [facility]    Saryah Loper, Arlyss Repress, MD 11/23/23 [phone]

## 2023-11-16 NOTE — Discharge Instructions (Signed)
You have been seen in the Emergency Department (ED)  today for back pain.  Your workup and exam have not shown any acute abnormalities and you are likely suffering from muscle strain or possible problems with your discs, but there is no treatment that will fix your symptoms at this time.  Please take Motrin (ibuprofen) as needed for your pain according to the instructions written on the box.  Alternatively, for the next five days you can take '800mg'$  three times daily with meals (it may upset your stomach).   Please follow up with your doctor as soon as possible regarding today's ED visit and your back pain.  Return to the ED for worsening back pain, fever, weakness or numbness of either leg, or if you develop either (1) an inability to urinate or have bowel movements, or (2) loss of your ability to control your bathroom functions (if you start having "accidents"), or if you develop other new symptoms that concern you.

## 2023-11-16 NOTE — ED Triage Notes (Signed)
 The pt is c/o her lower spine hurting for one week  no known injury  lmp march 17th

## 2023-11-19 ENCOUNTER — Emergency Department (HOSPITAL_COMMUNITY)
Admission: EM | Admit: 2023-11-19 | Discharge: 2023-11-19 | Disposition: A | Discharge status: Home / Self Care | Payer: MEDICAID | Attending: Emergency Medicine | Admitting: Emergency Medicine

## 2023-11-19 ENCOUNTER — Encounter (HOSPITAL_COMMUNITY): Payer: Self-pay

## 2023-11-19 ENCOUNTER — Other Ambulatory Visit: Payer: Self-pay

## 2023-11-19 DIAGNOSIS — M545 Low back pain, unspecified: Secondary | ICD-10-CM | POA: Insufficient documentation

## 2023-11-19 LAB — URINALYSIS, ROUTINE W REFLEX MICROSCOPIC
Bilirubin Urine: NEGATIVE
Glucose, UA: NEGATIVE mg/dL
Ketones, ur: 5 mg/dL — AB
Leukocytes,Ua: NEGATIVE
Nitrite: NEGATIVE
Protein, ur: NEGATIVE mg/dL
Specific Gravity, Urine: 1.027 (ref 1.005–1.030)
pH: 5 (ref 5.0–8.0)

## 2023-11-19 LAB — PREGNANCY, URINE: Preg Test, Ur: NEGATIVE

## 2023-11-19 MED ORDER — KETOROLAC TROMETHAMINE 15 MG/ML IJ SOLN
15.0000 mg | Freq: Once | INTRAMUSCULAR | Status: AC
  Administered 2023-11-19: 15 mg via INTRAMUSCULAR
  Filled 2023-11-19: qty 1

## 2023-11-19 NOTE — ED Triage Notes (Signed)
 Patient stated she has had centralized lower back pain for 2 weeks. Has tried ibuprofen, soaking in baths, ice packs without improvement. No falls.

## 2023-11-19 NOTE — Discharge Instructions (Addendum)
 You x-ray from 3 days ago was negative. Your urine is negative today.  Please pick up and take the steroids that were prescribed. After 5 days you can then continue taking the Ibuprofen. You can also take 1000mg  of tylenol every 6 hours. Use ice, heat, lidocaine patch and gentle stretching. Follow up with PCP.

## 2023-11-19 NOTE — ED Provider Notes (Signed)
 [facility] AT [facility] Provider Note   CSN: [id number] Arrival date & time: 11/19/23  1649     History  Chief Complaint  Patient presents with   Back Pain    Jeanne Chaney is a 33 y.o. female with past medical history of herpes, depressive disorder presenting to emergency room with complaint of low back pain for 2 weeks. Reports that all she has been taking is Ibuprofen and it is helping, but only for short period of time.  She reports when she was originally seen in the ED she was prescribed steroids and lidocaine patches but she has not started steroids yet.  She was diagnosed with muscle spasm.  Her symptoms do not radiate.  They are worse with movement and bending down.  They are constant but severity is intermittent.  She has no saddle anesthesia.  No loss of bowel or bladder.  No dysuria.  No recent injury trauma or fall.  Denies any associated chest pain, abdominal pain nausea vomiting diarrhea.  No history of cancer.  No recent steroid use.  She does not think she is pregnant.   Back Pain      Home Medications Prior to Admission medications   Medication Sig Start Date End Date Taking? Authorizing Provider  citalopram (CELEXA) 20 MG tablet Take 1 tablet (20 mg total) by mouth daily. 09/06/23   [name], NP  diphenhydrAMINE (BENADRYL) 25 MG tablet Take 1 tablet (25 mg total) by mouth every 8 (eight) hours as needed for itching. 11/12/23   Doreene Eland, MD  ibuprofen (ADVIL) 800 MG tablet Take 1 tablet (800 mg total) by mouth every 8 (eight) hours as needed for moderate pain (pain score 4-6). 11/16/23   Long, Arlyss Repress, MD  methocarbamol (ROBAXIN) 500 MG tablet Take 1 tablet (500 mg total) by mouth every 8 (eight) hours as needed. 11/16/23   Long, Arlyss Repress, MD  mupirocin cream (BACTROBAN) 2 % Apply 1 Application topically 2 (two) times daily. 11/12/23   Doreene Eland, MD      Allergies    Patient has no known allergies.     Review of Systems   Review of Systems  Musculoskeletal:  Positive for back pain.    Physical Exam Updated Vital Signs BP (!) 144/90   Pulse 92   Temp 99.7 F (37.6 C) (Oral)   Resp 18   Ht 5\' 7"  (1.702 m)   Wt 86 kg   LMP 11/05/2023 (Exact Date)   SpO2 95%   BMI 29.69 kg/m  Physical Exam Vitals and nursing note reviewed.  Constitutional:      General: She is not in acute distress.    Appearance: She is not toxic-appearing.  HENT:     Head: Normocephalic and atraumatic.  Eyes:     General: No scleral icterus.    Conjunctiva/sclera: Conjunctivae normal.  Cardiovascular:     Rate and Rhythm: Normal rate and regular rhythm.     Pulses: Normal pulses.     Heart sounds: Normal heart sounds.  Pulmonary:     Effort: Pulmonary effort is normal. No respiratory distress.     Breath sounds: Normal breath sounds.  Abdominal:     General: Abdomen is flat. Bowel sounds are normal.     Palpations: Abdomen is soft.     Tenderness: There is no abdominal tenderness.  Musculoskeletal:     Comments: No midline tenderness.  Tenderness to palpation over right and left  paravertebral musculature.  Skin:    General: Skin is warm and dry.     Findings: No lesion.  Neurological:     General: No focal deficit present.     Mental Status: She is alert and oriented to person, place, and time. Mental status is at baseline.     ED Results / Procedures / Treatments   Labs (all labs ordered are listed, but only abnormal results are displayed) Labs Reviewed  URINALYSIS, ROUTINE W REFLEX MICROSCOPIC  PREGNANCY, URINE    EKG None  Radiology No results found.  Procedures Procedures    Medications Ordered in ED Medications - No data to display  ED Course/ Medical Decision Making/ A&P                                 Medical Decision Making Amount and/or Complexity of Data Reviewed Labs: ordered.  Risk Prescription drug management.   Jeanne Chaney 33 y.o. presented today  for back pain. Working Ddx: MSK in nature, fracture, epidural hematoma/abscess, cauda equina syndrome, spinal stenosis, spinal malignancy, discitis, spinal infection, spondylitises/ spondylosis, conus medullaris, DDD of the back.  R/o DDx: Cauda equina syndrome and additional dx are less likely than current impression due to history of present illness, physical exam, labs/imaging findings. No focal neurological deficits, no loss of bowel or bladder control.  Denies fever, night sweats, weight loss, h/o cancer, IVDU.  PMHX: Depression     Review of prior external notes: 11/19/23  Labs:  UA negative, denies urinary symptoms.  Urine pregnancy test negative.    Problem List / ED Course / Critical interventions / Medication management  Patient reporting to emergency room with complaint of low back pain that has been ongoing for approximately 2 weeks.  Was seen in ER and had x-rays.  X-rays are without any acute findings.  She has no associated abdominal pain nausea vomiting or dysuria.  She has negative UA and negative pregnancy test she is moving extremities without difficulty.  No radicular symptoms.  She has no red flag symptoms associated with her back pain thus I do not feel that emergent MRI or repeat imaging is necessary at this time. No injury, trauma or fall. She is neurovascularly intact.  Hemodynamically stable.  Does have reproducible tenderness over bilateral low back muscle. Exam is consistent with muscle strain. She was prescribed a steroid at prior ER visit but has not taken.  Reports she is not regularly taking medication. Denies rash over area. She will follow up with PCP or return to ER. No new medication were sent as she has not pick up steroid, Lidoderm patch from last visit. I did offer to resend, patient declined.   Patients vitals assessed. Upon arrival patient is  hemodynamically stable.  I have reviewed the patients home medicines and have made adjustments as  needed  Plan:  F/u w/ PCP in 2-3d to ensure resolution of sx.  RICE protocol and pain medicine discussed with patient.  Patient was given return precautions. Patient stable for discharge at this time.  Patient educated on sx/dx and verbalized understanding of plan. Return to ER with new or worsening sx.          Final Clinical Impression(s) / ED Diagnoses Final diagnoses:  Acute bilateral low back pain without sciatica    Rx / DC Orders ED Discharge Orders     None  Reinaldo Raddle 11/19/23 2344    Terald Sleeper, MD 11/20/23 [phone]

## 2023-11-22 ENCOUNTER — Telehealth (INDEPENDENT_AMBULATORY_CARE_PROVIDER_SITE_OTHER): Payer: Self-pay | Admitting: Primary Care

## 2023-11-22 ENCOUNTER — Encounter: Payer: Self-pay | Admitting: Podiatry

## 2023-11-22 NOTE — Telephone Encounter (Signed)
 Called to confirm appt. Pt will be present

## 2023-11-26 ENCOUNTER — Inpatient Hospital Stay (INDEPENDENT_AMBULATORY_CARE_PROVIDER_SITE_OTHER): Payer: MEDICAID | Admitting: Primary Care

## 2023-11-26 ENCOUNTER — Ambulatory Visit (INDEPENDENT_AMBULATORY_CARE_PROVIDER_SITE_OTHER): Payer: MEDICAID | Admitting: Primary Care

## 2023-11-26 ENCOUNTER — Encounter (INDEPENDENT_AMBULATORY_CARE_PROVIDER_SITE_OTHER): Payer: Self-pay

## 2023-11-26 ENCOUNTER — Encounter (INDEPENDENT_AMBULATORY_CARE_PROVIDER_SITE_OTHER): Payer: Self-pay | Admitting: Pharmacist

## 2023-11-26 DIAGNOSIS — M545 Low back pain, unspecified: Secondary | ICD-10-CM | POA: Diagnosis not present

## 2023-11-26 NOTE — Progress Notes (Unsigned)
 [facility]  Jeanne Chaney, is a 33 y.o. female  GNF:[id number]  QIO:[id number]  DOB - [birth year]       Subjective:   Jeanne Chaney is a 33 y.o. female here today for an acute visit.  She has been experiencing low back pain and spasms aggravating factors bending, lifting, trying to walk upstairs and twisting.  Patient is requesting a referral to orthopedics  No problems updated.  Comprehensive ROS Pertinent positive and negative noted in HPI   No Known Allergies  Past Medical History:  Diagnosis Date   Herpes genitalia    Migraines    Trichimoniasis    Yeast infection     Current Outpatient Medications on File Prior to Visit  Medication Sig Dispense Refill   citalopram (CELEXA) 20 MG tablet Take 1 tablet (20 mg total) by mouth daily. 90 tablet 1   diphenhydrAMINE (BENADRYL) 25 MG tablet Take 1 tablet (25 mg total) by mouth every 8 (eight) hours as needed for itching. 30 tablet 0   ibuprofen (ADVIL) 800 MG tablet Take 1 tablet (800 mg total) by mouth every 8 (eight) hours as needed for moderate pain (pain score 4-6). 21 tablet 0   methocarbamol (ROBAXIN) 500 MG tablet Take 1 tablet (500 mg total) by mouth every 8 (eight) hours as needed. 20 tablet 0   mupirocin cream (BACTROBAN) 2 % Apply 1 Application topically 2 (two) times daily. 15 g 0   No current facility-administered medications on file prior to visit.   Health Maintenance  Topic Date Due   COVID-19 Vaccine (1) Never done   Flu Shot  03/21/2024   DTaP/Tdap/Td vaccine (2 - Td or Tdap) 05/29/2027   Pap with HPV screening  02/14/2028   Hepatitis C Screening  Completed   HIV Screening  Completed   HPV Vaccine  Aged Out    Objective:  LMP 11/05/2023 (Exact Date)  BP Readings from Last 3 Encounters:  11/19/23 (!) 144/90  11/16/23 131/87  11/12/23 129/77   Physical Exam Vitals reviewed.  HENT:     Head: Normocephalic.     Right Ear: External ear normal.     Left Ear: External ear normal.   Cardiovascular:     Rate and Rhythm: Normal rate and regular rhythm.  Pulmonary:     Effort: Pulmonary effort is normal.     Breath sounds: Normal breath sounds.  Abdominal:     General: Bowel sounds are normal.     Palpations: Abdomen is soft.  Musculoskeletal:        General: Tenderness present.     Cervical back: Normal range of motion.  Neurological:     Mental Status: She is alert and oriented to person, place, and time.  Psychiatric:        Mood and Affect: Mood normal.        Behavior: Behavior normal.     Assessment & Plan  Diagnoses and all orders for this visit:  Acute bilateral low back pain without sciatica L3- S2  -     AMB referral to orthopedics     Patient have been counseled extensively about nutrition and exercise. Other issues discussed during this visit include: low cholesterol diet, weight control and daily exercise, foot care, annual eye examinations at Ophthalmology, importance of adherence with medications and regular follow-up. We also discussed long term complications of uncontrolled diabetes and hypertension.   No follow-ups on file.  The patient was given clear instructions to go to ER or return  to medical center if symptoms don't improve, worsen or new problems develop. The patient verbalized understanding. The patient was told to call to get lab results if they haven't heard anything in the next week.   This note has been created with Education officer, environmental. Any transcriptional errors are unintentional.   [name], NP 11/26/2023, 11:25 AM

## 2023-11-29 ENCOUNTER — Telehealth (INDEPENDENT_AMBULATORY_CARE_PROVIDER_SITE_OTHER): Payer: Self-pay | Admitting: Primary Care

## 2023-11-29 NOTE — Telephone Encounter (Signed)
 Error

## 2023-11-30 ENCOUNTER — Ambulatory Visit (INDEPENDENT_AMBULATORY_CARE_PROVIDER_SITE_OTHER): Payer: MEDICAID | Admitting: Primary Care

## 2023-11-30 ENCOUNTER — Inpatient Hospital Stay (INDEPENDENT_AMBULATORY_CARE_PROVIDER_SITE_OTHER): Payer: MEDICAID | Admitting: Primary Care

## 2023-12-06 ENCOUNTER — Ambulatory Visit (INDEPENDENT_AMBULATORY_CARE_PROVIDER_SITE_OTHER): Payer: MEDICAID | Admitting: Physical Medicine and Rehabilitation

## 2023-12-06 ENCOUNTER — Encounter: Payer: Self-pay | Admitting: Physical Medicine and Rehabilitation

## 2023-12-06 DIAGNOSIS — M7918 Myalgia, other site: Secondary | ICD-10-CM

## 2023-12-06 DIAGNOSIS — S39012A Strain of muscle, fascia and tendon of lower back, initial encounter: Secondary | ICD-10-CM | POA: Diagnosis not present

## 2023-12-06 MED ORDER — CYCLOBENZAPRINE HCL 10 MG PO TABS: 10.0000 mg | ORAL_TABLET | Freq: Every day | ORAL | 0 refills | Status: DC

## 2023-12-06 MED ORDER — MELOXICAM 15 MG PO TABS: 15.0000 mg | ORAL_TABLET | Freq: Every day | ORAL | 0 refills | Status: DC

## 2023-12-06 MED ORDER — PREDNISONE 50 MG PO TABS: 50.0000 mg | ORAL_TABLET | Freq: Every day | ORAL | 0 refills | Status: DC

## 2023-12-06 NOTE — Progress Notes (Signed)
 Pain Scale   Average Pain 10 Patient advising aprox. 1 month ago she rolled over in bed and felt a sharp pain in lower back radiating to her left groin area. Patient advised she was seen at Urgent Care but without any diagnosis. Patient advising she is unbale to do anything without severe pain.          +Driver, -BT, -Dye Allergies.

## 2023-12-06 NOTE — Progress Notes (Signed)
 Jeanne Chaney - 33 y.o. female MRN [id number]  Date of birth: 1991-08-20  Office Visit Note: Visit Date: 12/06/2023 PCP: Marius Siemens, NP Referred by: Marius Siemens, NP  Subjective: Chief Complaint  Patient presents with   Lower Back - Pain   HPI: Jeanne Chaney is a 33 y.o. female who comes in today per the request of Madelyn Schick, NP for evaluation of acute bilateral lower back pain. Pain started about 3 weeks ago after turning in bed. She is tearful upon arrival to office today. Her pain worsens with bending and activity. She describes pain as sore and aching sensation, currently rates as 10 out of 10. Some relief of pain with home exercise regimen, heating pad, rest and use of medications. She was evaluated in the emergency department and urgent care for ongoing issues with lower back pain. Some relief of pain with Ibuprofen  and Robaxin . She was prescribed oral Prednisone  but was not able to get filled. Recent lumbar radiographs show normal anatomical alignment, well preserved disc spacing, no spondylolisthesis. She does have significant stool burden on x-rays. Patient currently working full time in housekeeping. Patient denies focal weakness, numbness and tingling. No recent trauma or falls.      Review of Systems  Musculoskeletal:  Positive for back pain and myalgias.  Neurological:  Negative for tingling, sensory change, focal weakness and weakness.  All other systems reviewed and are negative.  Otherwise per HPI.  Assessment & Plan: Visit Diagnoses:    ICD-10-CM   1. Acute myofascial strain of lumbar region, initial encounter  S39.012A Ambulatory referral to Physical Therapy    2. Myofascial pain syndrome  M79.18 Ambulatory referral to Physical Therapy       Plan: Findings:  Acute bilateral lower back pain.  No pain radiating down the legs.  Patient continues to have severe discomfort despite good conservative therapy such as home exercise regimen, rest  and use of medications.  X-rays look fairly normal for her age.  I did speak with her briefly about stool burden present on x-rays and recommended patient try short regimen of stool softeners and MiraLAX.  Patient's clinical presentation and exam are consistent with acute lumbar myofascial strain.  There is myofascial tenderness noted to bilateral lumbar paraspinal regions upon palpation today.  We discussed treatment plan in detail today.  I prescribed meloxicam , flexeril  and oral prednisone .  She can start meloxicam  after completing short course of prednisone .  I explained to her that myofascial strain injuries can take 6 to 8 weeks to heal.  Also placed order for formal physical therapy, I do feel she would benefit from manual treatments and core strengthening.  I would like to see patient back in approximately 4 weeks for re-evaluation.  Should her pain persist or present is more radicular in nature would consider obtaining lumbar MRI imaging.  I encouraged patient to remain active as tolerated.  No red flag symptoms noted upon exam today.    Meds & Orders:  Meds ordered this encounter  Medications   meloxicam  (MOBIC ) 15 MG tablet    Sig: Take 1 tablet (15 mg total) by mouth daily.    Dispense:  30 tablet    Refill:  0   cyclobenzaprine  (FLEXERIL ) 10 MG tablet    Sig: Take 1 tablet (10 mg total) by mouth at bedtime.    Dispense:  30 tablet    Refill:  0   predniSONE  (DELTASONE ) 50 MG tablet    Sig: Take  1 tablet (50 mg total) by mouth daily with breakfast. Take until completed.    Dispense:  5 tablet    Refill:  0    Orders Placed This Encounter  Procedures   Ambulatory referral to Physical Therapy    Follow-up: Return for 4 week follow up for re-evaluation.   Procedures: No procedures performed      Clinical History: No specialty comments available.   She reports that she has never smoked. She has never used smokeless tobacco. No results for input(s): "HGBA1C", "LABURIC" in the  last 8760 hours.  Objective:  VS:  HT:    WT:   BMI:     BP:   HR: bpm  TEMP: ( )  RESP:  Physical Exam Vitals and nursing note reviewed.  HENT:     Head: Normocephalic and atraumatic.     Right Ear: External ear normal.     Left Ear: External ear normal.     Nose: Nose normal.     Mouth/Throat:     Mouth: Mucous membranes are moist.  Eyes:     Extraocular Movements: Extraocular movements intact.  Cardiovascular:     Rate and Rhythm: Normal rate.     Pulses: Normal pulses.  Pulmonary:     Effort: Pulmonary effort is normal.  Abdominal:     General: Abdomen is flat. There is distension.  Musculoskeletal:        General: Tenderness present.     Cervical back: Normal range of motion.     Comments: Patient rises from seated position to standing without difficulty. Good lumbar range of motion. No pain noted with facet loading. 5/5 strength noted with bilateral hip flexion, knee flexion/extension, ankle dorsiflexion/plantarflexion and EHL. No clonus noted bilaterally. No pain upon palpation of greater trochanters. No pain with internal/external rotation of bilateral hips. Sensation intact bilaterally. Myofascial tenderness noted to bilateral lumbar paraspinal regions upon palpation. Negative slump test bilaterally. Ambulates without aid, gait steady.     Skin:    General: Skin is warm and dry.     Capillary Refill: Capillary refill takes less than 2 seconds.  Neurological:     General: No focal deficit present.     Mental Status: She is alert and oriented to person, place, and time.  Psychiatric:        Mood and Affect: Mood normal.        Behavior: Behavior normal.     Ortho Exam  Imaging: No results found.  Past Medical/Family/Surgical/Social History: Medications & Allergies reviewed per EMR, new medications updated. Patient Active Problem List   Diagnosis Date Noted   Curly toe, acquired, left 02/23/2023   Tinea pedis of both feet 02/23/2023   Corn of toe 02/23/2023    Severe episode of recurrent major depressive disorder, without psychotic features (HCC) 03/30/2021   Vaginal irritation 04/30/2017   Herpes genitalis in women 10/06/2016   Pap smear for cervical cancer screening 10/06/2016   Past Medical History:  Diagnosis Date   Herpes genitalia    Migraines    Trichimoniasis    Yeast infection    Family History  Problem Relation Age of Onset   Asthma Mother    Past Surgical History:  Procedure Laterality Date   excision of wart Left 09/25/2023   left 4th toe   Social History   Occupational History   Not on file  Tobacco Use   Smoking status: Never   Smokeless tobacco: Never  Vaping Use   Vaping status:  Never Used  Substance and Sexual Activity   Alcohol use: No   Drug use: Yes    Types: Marijuana   Sexual activity: Yes    Birth control/protection: Implant

## 2023-12-13 ENCOUNTER — Other Ambulatory Visit: Payer: Self-pay

## 2023-12-13 ENCOUNTER — Encounter: Payer: Self-pay | Admitting: Physical Therapy

## 2023-12-13 ENCOUNTER — Ambulatory Visit: Payer: MEDICAID | Attending: Physical Medicine and Rehabilitation | Admitting: Physical Therapy

## 2023-12-13 DIAGNOSIS — M7989 Other specified soft tissue disorders: Secondary | ICD-10-CM | POA: Diagnosis present

## 2023-12-13 DIAGNOSIS — S39012A Strain of muscle, fascia and tendon of lower back, initial encounter: Secondary | ICD-10-CM | POA: Insufficient documentation

## 2023-12-13 DIAGNOSIS — M5459 Other low back pain: Secondary | ICD-10-CM | POA: Insufficient documentation

## 2023-12-13 DIAGNOSIS — M6281 Muscle weakness (generalized): Secondary | ICD-10-CM | POA: Diagnosis present

## 2023-12-13 DIAGNOSIS — M7918 Myalgia, other site: Secondary | ICD-10-CM | POA: Insufficient documentation

## 2023-12-13 NOTE — Therapy (Signed)
 OUTPATIENT PHYSICAL THERAPY THORACOLUMBAR EVALUATION   Patient Name: Jeanne Chaney MRN: [id number] DOB:[birth year], 33 y.o., female Today's Date: 12/13/2023  END OF SESSION:  PT End of Session - 12/13/23 0829     Visit Number 1    Number of Visits 13    Date for PT Re-Evaluation 01/24/24    PT Start Time 0827    PT Stop Time 0905    PT Time Calculation (min) 38 min    Activity Tolerance Patient tolerated treatment well    Behavior During Therapy [facility] for tasks assessed/performed             Past Medical History:  Diagnosis Date   Herpes genitalia    Migraines    Trichimoniasis    Yeast infection    Past Surgical History:  Procedure Laterality Date   excision of wart Left 09/25/2023   left 4th toe   Patient Active Problem List   Diagnosis Date Noted   Curly toe, acquired, left 02/23/2023   Tinea pedis of both feet 02/23/2023   Corn of toe 02/23/2023   Severe episode of recurrent major depressive disorder, without psychotic features (HCC) 03/30/2021   Vaginal irritation 04/30/2017   Herpes genitalis in women 10/06/2016   Pap smear for cervical cancer screening 10/06/2016    PCP: Marius Siemens, NP  REFERRING PROVIDER: Darryll Eng, NP  REFERRING DIAG:  [phone] (ICD-10-CM) - Acute myofascial strain of lumbar region, initial encounter  M79.18 (ICD-10-CM) - Myofascial pain syndrome    Rationale for Evaluation and Treatment: Rehabilitation  THERAPY DIAG:  Other low back pain  Other specified soft tissue disorders  Muscle weakness (generalized)  PERTINENT HISTORY: Major depressive disorder  WEIGHT BEARING RESTRICTIONS: No  FALLS:  Has patient fallen in last 6 months? No  LIVING ENVIRONMENT: Lives with: lives with their family Lives in: House/apartment Stairs: Yes: External: 12 steps; on right going up and occasional issues with stairs Has following equipment at home: None  OCCUPATION: housekeeping   PRECAUTIONS:  None ---------------------------------------------------------------------------------------------  SUBJECTIVE:                                                                                                                                                                                           SUBJECTIVE STATEMENT: Eval statement 12/13/2023: back pain started in march, pt was sleeping and tried to turn in bed, noticed it hurt and couldn't get out the bed easily. Lasted about 3 weeks, started taking medications, still feels it when she turns or moves specific ways. 6/10. Constant n/t down LLE  RED FLAGS: None    PLOF: Independent  PATIENT GOALS: figure  out what's going on  NEXT MD VISIT: one month ---------------------------------------------------------------------------------------------  OBJECTIVE:  Note: Objective measures were completed at Evaluation unless otherwise noted.  DIAGNOSTIC FINDINGS:  IMPRESSION: Negative.     Electronically Signed   By: Leverne Reading D.O.   On: 11/16/2023 18:54  PATIENT SURVEYS:  Modified Oswestry 25/50 (50%)   COGNITION: Overall cognitive status: Within functional limits for tasks assessed   PALPATION: TTP   Lumbar contraction pattern  L Multifidus:  R Multifidus:    SENSATION: Not tested  MUSCLE LENGTH: Hamstrings: B limited  Thomas test: L 2 and 1 joint hip flexor limitation  POSTURE: left pelvic obliquity   LUMBAR ROM:   AROM eval  Flexion 80%!  Extension 100%!  Right lateral flexion 100%  Left lateral flexion 100%  Right rotation 90%!  Left rotation 90%!   (Blank rows = not tested)  ! Indicates pain with testing  LOWER EXTREMITY ROM:     Passive  Right eval Left eval  Hip flexion WFL 110!  Hip extension [facility] 5!  Hip abduction    Hip adduction    Hip internal rotation [facility]  Hip external rotation [facility]  Knee flexion    Knee extension    Ankle dorsiflexion    Ankle plantarflexion    Ankle  inversion    Ankle eversion     (Blank rows = not tested)  ! Indicates pain with testing  LOWER EXTREMITY MMT:    MMT Right eval Left eval  Hip flexion 4 3+  Hip extension    Hip abduction 4 4-  Hip adduction    Hip internal rotation    Hip external rotation    Knee flexion    Knee extension    Ankle dorsiflexion    Ankle plantarflexion    Ankle inversion    Ankle eversion     (Blank rows = not tested)   ! Indicates pain with testing LUMBAR SPECIAL TESTS:  Prone instability test: Positive, Straight leg raise test: Positive, and FABER test: Positive   GAIT: Distance walked: 100 ft Assistive device utilized: None Level of assistance: Complete Independence Comments: antalgic L limp for first few steps following STS transfer  [facility] Adult PT Treatment:                                                DATE: 12/13/2023 Self Care: POC discussion Pt education                                                                                                                                PATIENT EDUCATION:  Education details: Pt received education regarding HEP performance, ADL performance, functional activity tolerance, impairment education, appropriate performance of therapeutic activities.  Person educated: Patient Education method: Explanation, Demonstration, Tactile cues, Verbal cues,  and Handouts Education comprehension: verbalized understanding  HOME EXERCISE PROGRAM: Access Code: [email or web address] URL: [email or web address] Date: 12/13/2023 Prepared by: Albesa Huguenin  Exercises - Piriformis Mobilization with Small Ball  - 2 x daily - 7 x weekly - 1 sets - 1 reps - 27m hold - Supine Figure 4 core/lumbar mobility  - 1 x daily - 4-7 x weekly - 2 sets - 20 reps - 1s hold - Supine Quadriceps Stretch with Strap on Table  - 1 x daily - 7 x weekly - 2 sets - 1 reps - 40m hold - seated Cross legged twist with breath hold  - 1 x daily - 7 x weekly - 2 sets - 6 reps - 5s  hold - Supine Bridge  - 1 x daily - 5 x weekly - 3 sets - 8 reps - 5s hold - Prone Hip Extension  - 1 x daily - 5 x weekly - 2 sets - 10 reps - 8s hold ---------------------------------------------------------------------------------------------  ASSESSMENT:  CLINICAL IMPRESSION: Eval impression (12/13/2023): Pt. attended today's physical therapy session for evaluation of low back pain. Pt has complaints of 6/10 back pain onset since match, pain started after feeling her back go out when turning during sleep, has slowly gotten better however continues to impact ADLs and community participation. Pt has notable deficits with pain during lumbar AROM, core activation patters, global hip strength, and lumbo pelvic stability/motility.  Pt would benefit from therapeutic focus on functional strengthening of B global hips, lumbo-pelvic stabilization, motility of L piriformis and L sciatic nerve, as well as postural education for functional positions such as sleeping, lifting, and housework. Treatment performed today focused on pt education detailed in obj. Pt demonstrated great understanding of education provided. required minimal verbal/tactile cues and no physical assistance for appropriate performance with today's activities. Pt requires the intervention of skilled outpatient physical therapy to address the aforementioned deficits and progress towards a functional level in line with therapeutic goals.    OBJECTIVE IMPAIRMENTS: Abnormal gait, decreased activity tolerance, decreased mobility, difficulty walking, decreased ROM, decreased strength, increased fascial restrictions, improper body mechanics, postural dysfunction, and pain.   ACTIVITY LIMITATIONS: carrying, lifting, bending, standing, transfers, bed mobility, dressing, self feeding, locomotion level, and caring for others  PARTICIPATION LIMITATIONS: cleaning, laundry, shopping, community activity, and occupation  PERSONAL FACTORS: Fitness,  Past/current experiences, Social background, and Time since onset of injury/illness/exacerbation are also affecting patient's functional outcome.   REHAB POTENTIAL: Good  CLINICAL DECISION MAKING: Stable/uncomplicated  EVALUATION COMPLEXITY: Low   GOALS: Goals reviewed with patient? YES  SHORT TERM GOALS: Target date: 01/03/2024  Pt will be independent with administered HEP to demonstrate the competency necessary for long term managemnet of symptoms at home. Baseline: Goal status: INITIAL   LONG TERM GOALS: Target date: 01/24/2024  Pt. Will achieve a MODI score of 19/50 (38%) as to demonstrate improvement in self-perceived functional ability with daily activities.  Baseline: 25/50 (50%) Goal status: INITIAL  2.  Pt will improve Global hip strength to a 4+/5 to demonstrate improvement in strength for quality of motion and activity performance. Baseline: see obj chart Goal status: INITIAL  3.  Pt will improve Lumbar AROM to 100% of standardized norms with less than 2/10 pain to demonstrate necessary mobility for high quality and safe ADLs  Baseline: see obj chart Goal status: INITIAL  4.  Pt will report pain levels improving during ADLs to be less than or equal to 2/10 as to demonstrate improved tolerance with daily functional activities such  as cleaning and dressing.  Baseline: 6/10 Goal status: INITIAL  ---------------------------------------------------------------------------------------------  PLAN:  PT FREQUENCY: 1-2x/week  PT DURATION: 6 weeks  PLANNED INTERVENTIONS: 97110-Therapeutic exercises, 97530- Therapeutic activity, 97112- Neuromuscular re-education, 97535- Self Care, 16109- Manual therapy, [phone]- Electrical stimulation (manual), Patient/Family education, Stair training, Dry Needling, Joint mobilization, and Spinal mobilization.  PLAN FOR NEXT SESSION: Review HEP, Begin POC as detailed in assessment   Albesa Huguenin, PT, DPT 12/13/2023, 9:12 AM    For  all possible CPT codes, reference the Planned Interventions line above.     Check all conditions that are expected to impact treatment: {Conditions expected to impact treatment:None of these apply   If treatment provided at initial evaluation, no treatment charged due to lack of authorization.

## 2023-12-17 ENCOUNTER — Ambulatory Visit: Payer: MEDICAID | Admitting: Physical Therapy

## 2023-12-24 ENCOUNTER — Ambulatory Visit: Payer: MEDICAID | Attending: Physical Medicine and Rehabilitation | Admitting: Physical Therapy

## 2023-12-24 ENCOUNTER — Telehealth: Payer: Self-pay | Admitting: Physical Therapy

## 2023-12-24 NOTE — Telephone Encounter (Signed)
 Pt was contact via phone call at 4:33 PM regarding missed appointment at 1615. Pt was educated regarding attendance policy, effect of current POC, and how to schedule future appointments.  Albesa Huguenin, PT, DPT 12/24/2023, 4:33 PM

## 2023-12-26 ENCOUNTER — Ambulatory Visit: Payer: MEDICAID

## 2023-12-31 ENCOUNTER — Ambulatory Visit: Payer: MEDICAID

## 2024-01-02 ENCOUNTER — Ambulatory Visit: Payer: MEDICAID | Admitting: Physical Therapy

## 2024-01-04 ENCOUNTER — Ambulatory Visit: Payer: MEDICAID | Admitting: Physical Medicine and Rehabilitation

## 2024-01-07 ENCOUNTER — Ambulatory Visit: Payer: MEDICAID

## 2024-01-09 ENCOUNTER — Encounter: Payer: MEDICAID | Admitting: Physical Therapy

## 2024-01-30 ENCOUNTER — Encounter (HOSPITAL_COMMUNITY): Payer: Self-pay | Admitting: *Deleted

## 2024-01-30 ENCOUNTER — Other Ambulatory Visit: Payer: Self-pay

## 2024-01-30 ENCOUNTER — Ambulatory Visit (HOSPITAL_COMMUNITY)
Admission: EM | Admit: 2024-01-30 | Discharge: 2024-01-30 | Disposition: A | Discharge status: Home / Self Care | Payer: MEDICAID | Attending: Internal Medicine | Admitting: Internal Medicine

## 2024-01-30 DIAGNOSIS — S0502XA Injury of conjunctiva and corneal abrasion without foreign body, left eye, initial encounter: Secondary | ICD-10-CM | POA: Diagnosis not present

## 2024-01-30 MED ORDER — ERYTHROMYCIN 5 MG/GM OP OINT: TOPICAL_OINTMENT | OPHTHALMIC | 0 refills | Status: DC

## 2024-01-30 MED ORDER — TETRACAINE HCL 0.5 % OP SOLN
OPHTHALMIC | Status: AC
  Filled 2024-01-30: qty 4

## 2024-01-30 MED ORDER — FLUORESCEIN SODIUM 1 MG OP STRP
ORAL_STRIP | OPHTHALMIC | Status: AC
  Filled 2024-01-30: qty 1

## 2024-01-30 NOTE — Discharge Instructions (Signed)
 Your eye discomfort is due to corneal irritation/abrasion.  This is a scratch to the protective film of your eye.  Use warm compresses frequently with clean wash cloth. Apply thin ribbon of erythromycin eye ointment to the inside of the lower eyelid twice daily. After applying ointment, close eyes and roll eyes around to coat the eye. This may result in blurry vision to the affected eye for 30-60 minutes, so plan accordingly if you need to drive after applying ointment.  Apply ointment twice a day for 7 days.  Follow-up with PCP or eye doctor.  If you develop any new or worsening symptoms or if your symptoms do not start to improve, pleases return here or follow-up with your primary care provider. If your symptoms are severe, please go to the emergency room.

## 2024-01-30 NOTE — ED Triage Notes (Signed)
 T eye drainage and pressure behind LT eye foe 3 days.

## 2024-01-30 NOTE — ED Provider Notes (Signed)
 [facility]    CSN: [id number] Arrival date & time: 01/30/24  0802      History   Chief Complaint Chief Complaint  Patient presents with   Eye Drainage    HPI Jeanne Chaney is a 33 y.o. female.   Patient presents to urgent care for evaluation of left eye pain, redness, drainage, and irritation that started 3 days ago.  Symptoms are localized to the left eye, no symptoms to the right eye.  Symptoms began as eye redness that have progressed to crusty yellow/purulent eye drainage and light sensitivity.  She reports dull pain to the left eye that worsens when she is in bright light.  Reports copious watery drainage from the left eye.  No recent trauma or injuries to the eyes reported.  She does remember itching the left eye and wonders if this may have worsened symptoms.  Denies visual disturbance, dizziness, headache, fever, chills, and use of contact lenses/glasses for vision correction.  She is using Visine eyedrops with minimal relief.     Past Medical History:  Diagnosis Date   Herpes genitalia    Migraines    Trichimoniasis    Yeast infection     Patient Active Problem List   Diagnosis Date Noted   Curly toe, acquired, left 02/23/2023   Tinea pedis of both feet 02/23/2023   Corn of toe 02/23/2023   Severe episode of recurrent major depressive disorder, without psychotic features (HCC) 03/30/2021   Vaginal irritation 04/30/2017   Herpes genitalis in women 10/06/2016   Pap smear for cervical cancer screening 10/06/2016    Past Surgical History:  Procedure Laterality Date   excision of wart Left 09/25/2023   left 4th toe    OB History     Gravida  2   Para  2   Term  2   Preterm      AB      Living  2      SAB      IAB      Ectopic      Multiple      Live Births  2            Home Medications    Prior to Admission medications   Medication Sig Start Date End Date Taking? Authorizing Provider  citalopram  (CELEXA ) 20 MG  tablet Take 1 tablet (20 mg total) by mouth daily. 09/06/23  Yes Marius Siemens, NP  cyclobenzaprine  (FLEXERIL ) 10 MG tablet Take 1 tablet (10 mg total) by mouth at bedtime. 12/06/23  Yes Williams, Megan E, NP  erythromycin ophthalmic ointment Place a 1/2 inch ribbon of ointment into the lower eyelid every 12 hours for the next 7 days. 01/30/24  Yes Starlene Eaton, FNP  ibuprofen  (ADVIL ) 800 MG tablet Take 1 tablet (800 mg total) by mouth every 8 (eight) hours as needed for moderate pain (pain score 4-6). 11/16/23  Yes Long, Shereen Dike, MD  meloxicam  (MOBIC ) 15 MG tablet Take 1 tablet (15 mg total) by mouth daily. 12/06/23 12/05/24 Yes Williams, Megan E, NP  methocarbamol  (ROBAXIN ) 500 MG tablet Take 1 tablet (500 mg total) by mouth every 8 (eight) hours as needed. 11/16/23  Yes Long, Shereen Dike, MD  diphenhydrAMINE  (BENADRYL ) 25 MG tablet Take 1 tablet (25 mg total) by mouth every 8 (eight) hours as needed for itching. 11/12/23   Arn Lane, MD  mupirocin  cream (BACTROBAN ) 2 % Apply 1 Application topically 2 (two) times daily. 11/12/23  Arn Lane, MD  predniSONE  (DELTASONE ) 50 MG tablet Take 1 tablet (50 mg total) by mouth daily with breakfast. Take until completed. 12/06/23   Darryll Eng, NP    Family History Family History  Problem Relation Age of Onset   Asthma Mother     Social History Social History   Tobacco Use   Smoking status: Never   Smokeless tobacco: Never  Vaping Use   Vaping status: Never Used  Substance Use Topics   Alcohol use: No   Drug use: Yes    Types: Marijuana     Allergies   Patient has no known allergies.   Review of Systems Review of Systems Per HPI  Physical Exam Triage Vital Signs ED Triage Vitals [01/30/24 0824]  Encounter Vitals Group     BP 121/71     Systolic BP Percentile      Diastolic BP Percentile      Pulse Rate 94     Resp 18     Temp 98.8 F (37.1 C)     Temp src      SpO2 96 %     Weight      Height       Head Circumference      Peak Flow      Pain Score 9     Pain Loc      Pain Education      Exclude from Growth Chart    No data found.  Updated Vital Signs BP 121/71   Pulse 94   Temp 98.8 F (37.1 C)   Resp 18   LMP 01/23/2024 (Exact Date)   SpO2 96%   Visual Acuity Right Eye Distance: 20/25 Left Eye Distance: 20/30 Bilateral Distance: 20/25  Right Eye Near:   Left Eye Near:    Bilateral Near:     Physical Exam Vitals and nursing note reviewed.  Constitutional:      Appearance: She is not ill-appearing or toxic-appearing.  HENT:     Head: Normocephalic and atraumatic.     Right Ear: Hearing and external ear normal.     Left Ear: Hearing and external ear normal.     Nose: Nose normal.     Mouth/Throat:     Lips: Pink.  Eyes:     General: Lids are normal. Lids are everted, no foreign bodies appreciated. Vision grossly intact. Gaze aligned appropriately.        Left eye: Discharge (Watery/purulent) present.No foreign body or hordeolum.     Extraocular Movements: Extraocular movements intact.     Conjunctiva/sclera:     Right eye: Right conjunctiva is not injected. No chemosis, exudate or hemorrhage.    Left eye: Left conjunctiva is injected. No chemosis, exudate or hemorrhage.    Pupils: Pupils are equal, round, and reactive to light.     Left eye: Corneal abrasion and fluorescein uptake present. Seidel exam negative.    Visual Fields: Right eye visual fields normal and left eye visual fields normal.     Comments: Fluorescein stain performed, patient tolerated procedure well.  Fluorescein uptake to the 5 o'clock position of the left pupil consistent with corneal abrasion.  Pulmonary:     Effort: Pulmonary effort is normal.  Musculoskeletal:     Cervical back: Full passive range of motion without pain and neck supple.  Lymphadenopathy:     Cervical: No cervical adenopathy.  Skin:    General: Skin is warm and dry.     Capillary Refill:  Capillary refill takes less  than 2 seconds.     Findings: No rash.  Neurological:     General: No focal deficit present.     Mental Status: She is alert and oriented to person, place, and time. Mental status is at baseline.     Cranial Nerves: No dysarthria or facial asymmetry.  Psychiatric:        Mood and Affect: Mood normal.        Speech: Speech normal.        Behavior: Behavior normal.        Thought Content: Thought content normal.        Judgment: Judgment normal.      UC Treatments / Results  Labs (all labs ordered are listed, but only abnormal results are displayed) Labs Reviewed - No data to display  EKG   Radiology No results found.  Procedures Procedures (including critical care time)  Medications Ordered in UC Medications - No data to display  Initial Impression / Assessment and Plan / UC Course  I have reviewed the triage vital signs and the nursing notes.  Pertinent labs & imaging results that were available during my care of the patient were reviewed by me and considered in my medical decision making (see chart for details).   1.  Abrasion of left cornea Corneal abrasion found on exam with fluorescein stain. No red flags on exam.  Warm compresses and erythromycin ointment recommended/prescribed. May use Tylenol  as needed for aches and pains. Follow-up with ophthalmologist on-call (Dr. Alto Atta) as needed for worsening symptoms.  Counseled patient on potential for adverse effects with medications prescribed/recommended today, strict ER and return-to-clinic precautions discussed, patient verbalized understanding.    Final Clinical Impressions(s) / UC Diagnoses   Final diagnoses:  Abrasion of left cornea, initial encounter     Discharge Instructions      Your eye discomfort is due to corneal irritation/abrasion.  This is a scratch to the protective film of your eye.  Use warm compresses frequently with clean wash cloth. Apply thin ribbon of erythromycin eye ointment to  the inside of the lower eyelid twice daily. After applying ointment, close eyes and roll eyes around to coat the eye. This may result in blurry vision to the affected eye for 30-60 minutes, so plan accordingly if you need to drive after applying ointment.  Apply ointment twice a day for 7 days.  Follow-up with PCP or eye doctor.  If you develop any new or worsening symptoms or if your symptoms do not start to improve, pleases return here or follow-up with your primary care provider. If your symptoms are severe, please go to the emergency room.   ED Prescriptions     Medication Sig Dispense Auth. Provider   erythromycin ophthalmic ointment Place a 1/2 inch ribbon of ointment into the lower eyelid every 12 hours for the next 7 days. 3.5 g Starlene Eaton, FNP      PDMP not reviewed this encounter.   Starlene Eaton, [place] 01/30/24 [phone]

## 2024-02-08 ENCOUNTER — Encounter (HOSPITAL_COMMUNITY): Payer: Self-pay

## 2024-02-08 ENCOUNTER — Ambulatory Visit (HOSPITAL_COMMUNITY)
Admission: EM | Admit: 2024-02-08 | Discharge: 2024-02-08 | Disposition: A | Discharge status: Home / Self Care | Payer: MEDICAID | Attending: Emergency Medicine | Admitting: Emergency Medicine

## 2024-02-08 ENCOUNTER — Ambulatory Visit (INDEPENDENT_AMBULATORY_CARE_PROVIDER_SITE_OTHER): Payer: MEDICAID

## 2024-02-08 DIAGNOSIS — M79674 Pain in right toe(s): Secondary | ICD-10-CM | POA: Diagnosis not present

## 2024-02-08 DIAGNOSIS — L03031 Cellulitis of right toe: Secondary | ICD-10-CM

## 2024-02-08 DIAGNOSIS — M79676 Pain in unspecified toe(s): Secondary | ICD-10-CM

## 2024-02-08 MED ORDER — SULFAMETHOXAZOLE-TRIMETHOPRIM 800-160 MG PO TABS: 1.0000 | ORAL_TABLET | Freq: Two times a day (BID) | ORAL | 0 refills | Status: AC

## 2024-02-08 MED ORDER — NAPROXEN 375 MG PO TABS
375.0000 mg | ORAL_TABLET | Freq: Two times a day (BID) | ORAL | 0 refills | Status: DC
Start: 2024-02-08 — End: 2024-04-04

## 2024-02-08 NOTE — ED Provider Notes (Signed)
 [facility]    CSN: [id number] Arrival date & time: 02/08/24  1537      History   Chief Complaint Chief Complaint  Patient presents with   Toe Injury    HPI Jeanne Chaney is a 33 y.o. female.   Patient presents with right pinky toe pain, redness, and swelling.  Patient states that about a month ago she did have a small cut on the underside of her right pinky toe and she has been applying antibiotic ointment to this.  Patient states that she felt the cut healed but then yesterday began to have pain, redness, and swelling to her right pinky toe.  Patient reports that she does have a podiatrist that she already sees for concerns on her left foot.  Patient states that she has an appointment with them on Monday, but came here today because she is concerned about the sudden onset of swelling and redness.  The history is provided by the patient and medical records.    Past Medical History:  Diagnosis Date   Herpes genitalia    Migraines    Trichimoniasis    Yeast infection     Patient Active Problem List   Diagnosis Date Noted   Curly toe, acquired, left 02/23/2023   Tinea pedis of both feet 02/23/2023   Corn of toe 02/23/2023   Severe episode of recurrent major depressive disorder, without psychotic features (HCC) 03/30/2021   Vaginal irritation 04/30/2017   Herpes genitalis in women 10/06/2016   Pap smear for cervical cancer screening 10/06/2016    Past Surgical History:  Procedure Laterality Date   excision of wart Left 09/25/2023   left 4th toe    OB History     Gravida  2   Para  2   Term  2   Preterm      AB      Living  2      SAB      IAB      Ectopic      Multiple      Live Births  2            Home Medications    Prior to Admission medications   Medication Sig Start Date End Date Taking? Authorizing Provider  naproxen  (NAPROSYN ) 375 MG tablet Take 1 tablet (375 mg total) by mouth 2 (two) times daily. 02/08/24  Yes  Rosevelt Constable, Kristan Votta A, NP  sulfamethoxazole -trimethoprim  (BACTRIM  DS) 800-160 MG tablet Take 1 tablet by mouth 2 (two) times daily for 7 days. 02/08/24 02/15/24 Yes Brendy Ficek A, NP  cyclobenzaprine  (FLEXERIL ) 10 MG tablet Take 1 tablet (10 mg total) by mouth at bedtime. 12/06/23   Williams, Megan E, NP  diphenhydrAMINE  (BENADRYL ) 25 MG tablet Take 1 tablet (25 mg total) by mouth every 8 (eight) hours as needed for itching. 11/12/23   Arn Lane, MD  erythromycin  ophthalmic ointment Place a 1/2 inch ribbon of ointment into the lower eyelid every 12 hours for the next 7 days. 01/30/24   Starlene Eaton, FNP    Family History Family History  Problem Relation Age of Onset   Asthma Mother     Social History Social History   Tobacco Use   Smoking status: Never   Smokeless tobacco: Never  Vaping Use   Vaping status: Never Used  Substance Use Topics   Alcohol use: No   Drug use: Not Currently    Types: Marijuana     Allergies  Patient has no known allergies.   Review of Systems Review of Systems  Per HPI  Physical Exam Triage Vital Signs ED Triage Vitals  Encounter Vitals Group     BP 02/08/24 1557 136/84     Girls Systolic BP Percentile --      Girls Diastolic BP Percentile --      Boys Systolic BP Percentile --      Boys Diastolic BP Percentile --      Pulse Rate 02/08/24 1557 (!) 106     Resp 02/08/24 1557 14     Temp 02/08/24 1557 98.5 F (36.9 C)     Temp Source 02/08/24 1557 Oral     SpO2 02/08/24 1557 96 %     Weight --      Height --      Head Circumference --      Peak Flow --      Pain Score 02/08/24 1556 10     Pain Loc --      Pain Education --      Exclude from Growth Chart --    No data found.  Updated Vital Signs BP 136/84 (BP Location: Left Arm)   Pulse (!) 106   Temp 98.5 F (36.9 C) (Oral)   Resp 14   LMP 01/23/2024 (Exact Date)   SpO2 96%   Visual Acuity Right Eye Distance:   Left Eye Distance:   Bilateral Distance:     Right Eye Near:   Left Eye Near:    Bilateral Near:     Physical Exam Vitals and nursing note reviewed.  Constitutional:      General: She is awake. She is not in acute distress.    Appearance: Normal appearance. She is well-developed and well-groomed. She is not ill-appearing.  Feet:     Right foot:     Skin integrity: Erythema present.     Comments: Right little toe is erythematous, swollen, and tender.  There is some mild erythema spreading from the toe into the dorsal aspect of the forefoot  Skin:    General: Skin is warm and dry.     Findings: Erythema present.   Neurological:     Mental Status: She is alert.   Psychiatric:        Behavior: Behavior is cooperative.      UC Treatments / Results  Labs (all labs ordered are listed, but only abnormal results are displayed) Labs Reviewed - No data to display  EKG   Radiology DG Toe 5th Right Result Date: 02/08/2024 CLINICAL DATA:  Right small toe pain and swelling.  Erythema. EXAM: RIGHT FIFTH TOE COMPARISON:  None Available. FINDINGS: The toe is flexed causing some superimposition of of the middle and distal phalanges on frontal and oblique views. No bony destructive findings characteristic of osteomyelitis. No definite gas in the soft tissues. Soft tissue swelling in the small toe is observed. IMPRESSION: 1. Soft tissue swelling in the small toe, without bony destructive findings characteristic of osteomyelitis. Electronically Signed   By: Freida Jes M.D.   On: 02/08/2024 19:11    Procedures Procedures (including critical care time)  Medications Ordered in UC Medications - No data to display  Initial Impression / Assessment and Plan / UC Course  I have reviewed the triage vital signs and the nursing notes.  Pertinent labs & imaging results that were available during my care of the patient were reviewed by me and considered in my medical decision making (see chart  for details).     Patient is overall  well-appearing.  Vitals are stable.  Upon assessment the right little toe is erythematous, swollen, and tender.  There is some mild erythema spreading from the toe and to the dorsal aspect of the forefoot.  X-ray ordered to rule out osteomyelitis.  Based on my interpretation there is no evidence of osteomyelitis at this time.  Radiology report confirms this.  Prescribed Bactrim  for cellulitis coverage.  Prescribed naproxen  as needed for pain.  Given postop shoe to avoid any additional injury to the toe.  Discussed follow-up and return precautions. Final Clinical Impressions(s) / UC Diagnoses   Final diagnoses:  Pain of fifth toe  Cellulitis of toe of right foot     Discharge Instructions      Start taking Bactrim  twice daily for 7 days for cellulitis coverage. You can take naproxen  twice daily as needed for pain.  Do not take this with other NSAIDs including ibuprofen , Advil , Motrin , and Aleve . You can take 650 mg of Tylenol  every 6-8 hours as needed for breakthrough pain. Otherwise rest, ice, and elevate to help with swelling. You can wear the postop shoe when you are walking around to avoid any additional injury to your toe. Follow-up with your podiatrist as scheduled on Monday. Return here as needed.     ED Prescriptions     Medication Sig Dispense Auth. Provider   sulfamethoxazole -trimethoprim  (BACTRIM  DS) 800-160 MG tablet Take 1 tablet by mouth 2 (two) times daily for 7 days. 14 tablet Rosevelt Constable, Roshun Klingensmith A, NP   naproxen  (NAPROSYN ) 375 MG tablet Take 1 tablet (375 mg total) by mouth 2 (two) times daily. 20 tablet Levora Reas A, NP      PDMP not reviewed this encounter.   Karon Packer, NP 02/08/24 [phone]

## 2024-02-08 NOTE — Discharge Instructions (Signed)
 Start taking Bactrim  twice daily for 7 days for cellulitis coverage. You can take naproxen  twice daily as needed for pain.  Do not take this with other NSAIDs including ibuprofen , Advil , Motrin , and Aleve . You can take 650 mg of Tylenol  every 6-8 hours as needed for breakthrough pain. Otherwise rest, ice, and elevate to help with swelling. You can wear the postop shoe when you are walking around to avoid any additional injury to your toe. Follow-up with your podiatrist as scheduled on Monday. Return here as needed.

## 2024-02-08 NOTE — ED Triage Notes (Signed)
 Patient states she had a cut under her right pinky toe a month ago and has been using antibiotic ointment on the cut. Patient states yesterday she began having redness and swelling to the right pinky toe.

## 2024-02-11 ENCOUNTER — Ambulatory Visit (HOSPITAL_COMMUNITY): Payer: Self-pay

## 2024-02-11 ENCOUNTER — Ambulatory Visit (INDEPENDENT_AMBULATORY_CARE_PROVIDER_SITE_OTHER): Payer: MEDICAID | Admitting: Podiatry

## 2024-02-11 ENCOUNTER — Ambulatory Visit (INDEPENDENT_AMBULATORY_CARE_PROVIDER_SITE_OTHER): Payer: MEDICAID

## 2024-02-11 ENCOUNTER — Encounter: Payer: Self-pay | Admitting: Podiatry

## 2024-02-11 VITALS — BP 115/66 | HR 81 | Temp 97.4°F

## 2024-02-11 DIAGNOSIS — L03115 Cellulitis of right lower limb: Secondary | ICD-10-CM

## 2024-02-11 DIAGNOSIS — L02611 Cutaneous abscess of right foot: Secondary | ICD-10-CM

## 2024-02-11 DIAGNOSIS — L03119 Cellulitis of unspecified part of limb: Secondary | ICD-10-CM

## 2024-02-11 DIAGNOSIS — L02619 Cutaneous abscess of unspecified foot: Secondary | ICD-10-CM

## 2024-02-11 MED ORDER — AMOXICILLIN-POT CLAVULANATE 875-125 MG PO TABS: 1.0000 | ORAL_TABLET | Freq: Two times a day (BID) | ORAL | 0 refills | Status: DC

## 2024-02-11 MED ORDER — HYDROCODONE-ACETAMINOPHEN 5-325 MG PO TABS: 1.0000 | ORAL_TABLET | Freq: Four times a day (QID) | ORAL | 0 refills | Status: DC | PRN

## 2024-02-11 NOTE — Patient Instructions (Signed)
 Monitor for any signs/symptoms of infection. Call the office immediately if any occur or go directly to the emergency room. Call with any questions/concerns.

## 2024-02-12 ENCOUNTER — Ambulatory Visit: Payer: Self-pay | Admitting: Podiatry

## 2024-02-12 ENCOUNTER — Encounter: Payer: Self-pay | Admitting: Podiatry

## 2024-02-12 NOTE — Progress Notes (Signed)
 Subjective:   Patient ID: Jeanne Chaney, female   DOB: 34 y.o.   MRN: [id number]   HPI Chief Complaint  Patient presents with   Toe Pain    RM#11 Right pinky toe pain about 1 month ago noticed a cut under pinky toe and odor. Went to urgent care and x rays done referred here was told had infection in the bone.   33 year old female presents the office today with concerns of a wound is not healing to the bottom of the right foot underneath the fifth toe.  She states that it started about a month ago.  She is not sure how it started.  She was seen at urgent care on Friday and she was started on Bactrim .  She states the antibiotics are not helping.  She currently denies any fevers or chills.   Review of Systems  All other systems reviewed and are negative.  Past Medical History:  Diagnosis Date   Herpes genitalia    Migraines    Trichimoniasis    Yeast infection     Past Surgical History:  Procedure Laterality Date   excision of wart Left 09/25/2023   left 4th toe     Current Outpatient Medications:    amoxicillin-clavulanate (AUGMENTIN) 875-125 MG tablet, Take 1 tablet by mouth 2 (two) times daily., Disp: 20 tablet, Rfl: 0   HYDROcodone -acetaminophen  (NORCO/VICODIN) 5-325 MG tablet, Take 1-2 tablets by mouth every 6 (six) hours as needed., Disp: 15 tablet, Rfl: 0   cyclobenzaprine  (FLEXERIL ) 10 MG tablet, Take 1 tablet (10 mg total) by mouth at bedtime., Disp: 30 tablet, Rfl: 0   diphenhydrAMINE  (BENADRYL ) 25 MG tablet, Take 1 tablet (25 mg total) by mouth every 8 (eight) hours as needed for itching., Disp: 30 tablet, Rfl: 0   erythromycin  ophthalmic ointment, Place a 1/2 inch ribbon of ointment into the lower eyelid every 12 hours for the next 7 days., Disp: 3.5 g, Rfl: 0   naproxen  (NAPROSYN ) 375 MG tablet, Take 1 tablet (375 mg total) by mouth 2 (two) times daily., Disp: 20 tablet, Rfl: 0   sulfamethoxazole -trimethoprim  (BACTRIM  DS) 800-160 MG tablet, Take 1 tablet by mouth 2  (two) times daily for 7 days., Disp: 14 tablet, Rfl: 0  No Known Allergies        Objective:  Physical Exam   Vitals:   02/11/24 1859  BP: 115/66  Pulse: 81  Temp: (!) 97.4 F (36.3 C)  SpO2: 98%    General: AAO x3, NAD  Dermatological: There is edema to the right foot there is erythema on the dorsal aspect MPJs and slightly to the midfoot dorsally with increased temperature.  On the sulcus underneath the fifth toe is what appears to be hyperkeratotic lesion with dried blood and there is an area of fluctuation consistent with an abscess.  There is no crepitus send of the foot.  Vascular: Dorsalis Pedis artery and Posterior Tibial artery pedal pulses are 2/4 bilateral with immedate capillary fill time.  There is no pain with calf compression, swelling, warmth, erythema.   Neruologic: Grossly intact via light touch bilateral.   Musculoskeletal: Tenderness right foot along the area the abscess      Assessment:   Abscess right foot with cellulitis     Plan:  -Treatment options discussed including all alternatives, risks, and complications -Etiology of symptoms were discussed -X-rays were obtained and reviewed with the patient.  3 views right foot were obtained there is no evidence of acute fracture.  No definitive cortical changes suggest osteomyelitis.  Soft tissue edema noted. -Given the abscess we need to proceed with incision and drainage of this today.  We discussed pros and cons of this.  I am quite concerned of the infection.  Systemically she seems to be well.  We discussed that if there is no improvement there is any worsening she is to go to the hospital for likely IV antibiotics.  We discussed possible need for surgical intervention.  Given the abscess we discussed draining this in the office today.  Consent was signed.  I cleaned the skin with alcohol.  Mixture of 3 cc of lidocaine , Marcaine plain was infiltrated in a regional block fashion.  I then cleaned the skin  with Betadine.  I then utilized a 15 blade scalpel to remove hyperkeratotic tissue along the area of the abscess under the sulcus of the fifth toe.  Upon debridement this there is immediate significant purulence identified which was cultured.  I did debride going into the interspace dorsally.  After I decompressed this there was no further purulence.  Area with saline.  Dressing was applied. -Continue Bactrim , add Menton as well -Stat MRI ordered to rule out abscess.  Discussed possible need for surgical intervention if there is any residual abscess -Blood work ordered including CBC, sed rate, CRP, BMP -If there is any signs or symptoms of worsening infection she is to report directly to the emergency department.    [name] DPM

## 2024-02-13 ENCOUNTER — Telehealth: Payer: Self-pay | Admitting: Podiatry

## 2024-02-13 LAB — CBC WITH DIFFERENTIAL/PLATELET
Basophils Absolute: 0 10*3/uL (ref 0.0–0.2)
Basos: 0 %
EOS (ABSOLUTE): 0.1 10*3/uL (ref 0.0–0.4)
Eos: 2 %
Hematocrit: 37.5 % (ref 34.0–46.6)
Hemoglobin: 11.9 g/dL (ref 11.1–15.9)
Immature Grans (Abs): 0 10*3/uL (ref 0.0–0.1)
Immature Granulocytes: 0 %
Lymphocytes Absolute: 1.1 10*3/uL (ref 0.7–3.1)
Lymphs: 39 %
MCH: 28.6 pg (ref 26.6–33.0)
MCHC: 31.7 g/dL (ref 31.5–35.7)
MCV: 90 fL (ref 79–97)
Monocytes Absolute: 0.5 10*3/uL (ref 0.1–0.9)
Monocytes: 19 %
Neutrophils Absolute: 1.2 10*3/uL — ABNORMAL LOW (ref 1.4–7.0)
Neutrophils: 40 %
Platelets: 192 10*3/uL (ref 150–450)
RBC: 4.16 x10E6/uL (ref 3.77–5.28)
RDW: 13.7 % (ref 11.7–15.4)
WBC: 2.9 10*3/uL — ABNORMAL LOW (ref 3.4–10.8)

## 2024-02-13 LAB — HEMOGLOBIN A1C
Est. average glucose Bld gHb Est-mCnc: 108 mg/dL
Hgb A1c MFr Bld: 5.4 % (ref 4.8–5.6)

## 2024-02-13 LAB — BASIC METABOLIC PANEL WITH GFR
BUN/Creatinine Ratio: 13 (ref 9–23)
BUN: 10 mg/dL (ref 6–20)
CO2: 20 mmol/L (ref 20–29)
Calcium: 9.1 mg/dL (ref 8.7–10.2)
Chloride: 105 mmol/L (ref 96–106)
Creatinine, Ser: 0.77 mg/dL (ref 0.57–1.00)
Glucose: 85 mg/dL (ref 70–99)
Potassium: 4.2 mmol/L (ref 3.5–5.2)
Sodium: 139 mmol/L (ref 134–144)
eGFR: 106 mL/min/{1.73_m2} (ref >59)

## 2024-02-13 LAB — C-REACTIVE PROTEIN: CRP: 17 mg/L — ABNORMAL HIGH (ref 0–10)

## 2024-02-13 LAB — SEDIMENTATION RATE: Sed Rate: 30 mm/h (ref 0–32)

## 2024-02-13 NOTE — Telephone Encounter (Signed)
 This patient called stating per Dr Gershon she needs to be seen this week. He has no openings. Where would you like to put the patient?

## 2024-02-13 NOTE — Telephone Encounter (Signed)
 Patient is on amoxicillin she is vomiting and can't eat on that. Should she stay on that.

## 2024-02-14 ENCOUNTER — Ambulatory Visit (INDEPENDENT_AMBULATORY_CARE_PROVIDER_SITE_OTHER): Payer: MEDICAID | Admitting: Podiatry

## 2024-02-14 DIAGNOSIS — L02611 Cutaneous abscess of right foot: Secondary | ICD-10-CM

## 2024-02-15 LAB — WOUND CULTURE
MICRO NUMBER:: 16612626
SPECIMEN QUALITY:: ADEQUATE

## 2024-02-17 ENCOUNTER — Other Ambulatory Visit: Payer: Self-pay

## 2024-02-17 ENCOUNTER — Emergency Department (HOSPITAL_COMMUNITY)
Admission: EM | Admit: 2024-02-17 | Discharge: 2024-02-17 | Disposition: A | Discharge status: Home / Self Care | Payer: MEDICAID | Attending: Emergency Medicine | Admitting: Emergency Medicine

## 2024-02-17 ENCOUNTER — Encounter (HOSPITAL_COMMUNITY): Payer: Self-pay | Admitting: Emergency Medicine

## 2024-02-17 DIAGNOSIS — M79674 Pain in right toe(s): Secondary | ICD-10-CM | POA: Diagnosis present

## 2024-02-17 DIAGNOSIS — L089 Local infection of the skin and subcutaneous tissue, unspecified: Secondary | ICD-10-CM | POA: Diagnosis not present

## 2024-02-17 DIAGNOSIS — A4902 Methicillin resistant Staphylococcus aureus infection, unspecified site: Secondary | ICD-10-CM

## 2024-02-17 DIAGNOSIS — B9562 Methicillin resistant Staphylococcus aureus infection as the cause of diseases classified elsewhere: Secondary | ICD-10-CM | POA: Insufficient documentation

## 2024-02-17 LAB — COMPREHENSIVE METABOLIC PANEL WITH GFR
ALT: 11 U/L (ref 0–44)
AST: 19 U/L (ref 15–41)
Albumin: 4 g/dL (ref 3.5–5.0)
Alkaline Phosphatase: 49 U/L (ref 38–126)
Anion gap: 9 (ref 5–15)
BUN: 13 mg/dL (ref 6–20)
CO2: 24 mmol/L (ref 22–32)
Calcium: 9.4 mg/dL (ref 8.9–10.3)
Chloride: 105 mmol/L (ref 98–111)
Creatinine, Ser: 0.74 mg/dL (ref 0.44–1.00)
GFR, Estimated: >60 mL/min (ref >60)
Glucose, Bld: 100 mg/dL — ABNORMAL HIGH (ref 70–99)
Potassium: 3.7 mmol/L (ref 3.5–5.1)
Sodium: 138 mmol/L (ref 135–145)
Total Bilirubin: 0.3 mg/dL (ref 0.0–1.2)
Total Protein: 7.3 g/dL (ref 6.5–8.1)

## 2024-02-17 LAB — CBC WITH DIFFERENTIAL/PLATELET
Abs Immature Granulocytes: 0.01 10*3/uL (ref 0.00–0.07)
Basophils Absolute: 0 10*3/uL (ref 0.0–0.1)
Basophils Relative: 0 %
Eosinophils Absolute: 0.1 10*3/uL (ref 0.0–0.5)
Eosinophils Relative: 2 %
HCT: 38.4 % (ref 36.0–46.0)
Hemoglobin: 12.6 g/dL (ref 12.0–15.0)
Immature Granulocytes: 0 %
Lymphocytes Relative: 52 %
Lymphs Abs: 1.6 10*3/uL (ref 0.7–4.0)
MCH: 28.6 pg (ref 26.0–34.0)
MCHC: 32.8 g/dL (ref 30.0–36.0)
MCV: 87.1 fL (ref 80.0–100.0)
Monocytes Absolute: 0.4 10*3/uL (ref 0.1–1.0)
Monocytes Relative: 12 %
Neutro Abs: 1 10*3/uL — ABNORMAL LOW (ref 1.7–7.7)
Neutrophils Relative %: 34 %
Platelets: 185 10*3/uL (ref 150–400)
RBC: 4.41 MIL/uL (ref 3.87–5.11)
RDW: 13.9 % (ref 11.5–15.5)
WBC: 3 10*3/uL — ABNORMAL LOW (ref 4.0–10.5)
nRBC: 0 % (ref 0.0–0.2)

## 2024-02-17 LAB — I-STAT CG4 LACTIC ACID, ED: Lactic Acid, Venous: 1.1 mmol/L (ref 0.5–1.9)

## 2024-02-17 MED ORDER — DIPHENHYDRAMINE HCL 50 MG/ML IJ SOLN
25.0000 mg | Freq: Once | INTRAMUSCULAR | Status: AC
  Administered 2024-02-17: 25 mg via INTRAVENOUS
  Filled 2024-02-17: qty 1

## 2024-02-17 MED ORDER — VANCOMYCIN HCL 2000 MG/400ML IV SOLN
2000.0000 mg | Freq: Once | INTRAVENOUS | Status: AC
  Administered 2024-02-17: 2000 mg via INTRAVENOUS
  Filled 2024-02-17: qty 400

## 2024-02-17 MED ORDER — FLUCONAZOLE 200 MG PO TABS: 200.0000 mg | ORAL_TABLET | Freq: Every day | ORAL | 0 refills | Status: AC

## 2024-02-17 MED ORDER — CLINDAMYCIN HCL 300 MG PO CAPS: 600.0000 mg | ORAL_CAPSULE | Freq: Two times a day (BID) | ORAL | 0 refills | Status: DC

## 2024-02-17 MED ORDER — CLINDAMYCIN HCL 300 MG PO CAPS: 300.0000 mg | ORAL_CAPSULE | Freq: Four times a day (QID) | ORAL | 0 refills | Status: AC

## 2024-02-17 NOTE — ED Triage Notes (Signed)
 Patient arrives ambulatory by POV states she was called that her culture was positive for MRSA. States she is currently on antibiotics that caused vaginally itching and diarrhea.

## 2024-02-17 NOTE — ED Notes (Signed)
 The pt is c/o itching all over her body for 10 minutes

## 2024-02-17 NOTE — ED Notes (Signed)
 Dr penna at  the bedside

## 2024-02-17 NOTE — Progress Notes (Signed)
 ED Pharmacy Antibiotic Sign Off An antibiotic consult was received from an ED provider for vancomycin per pharmacy dosing for concern for MRSA bacteremia. A chart review was completed to assess appropriateness.   The following one time order(s) were placed:  Vancomycin 2g  Further antibiotic and/or antibiotic pharmacy consults should be ordered by the admitting provider if indicated.   Thank you for allowing pharmacy to be a part of this patient's care.   Leonor GORMAN Bash, [facility]  Clinical Pharmacist 02/17/24 4:11 PM

## 2024-02-17 NOTE — Discharge Instructions (Addendum)
 You were seen in the emerged part for a MRSA positive right foot infection Your blood work looked okay The MRSA test that was taken from your foot shows that the antibiotics you are currently on are not going to work For this reason we gave you a dose of IV antibiotics here and discharged you with a prescription for a new antibiotic called clindamycin  Please pick this up from your pharmacy and begin taking as directed tomorrow morning and keep your appointment with your primary care doctor and podiatry team to ensure this is getting better Return to the emergency department for severe pain streaking redness fevers or any other concerns

## 2024-02-17 NOTE — ED Provider Notes (Signed)
 [place] [facility] Provider Note   CSN: [id number] Arrival date & time: 02/17/24  1421     Patient presents with: Abnormal Lab   Jeanne Chaney is a 33 y.o. female.  Who presents to the ED given concern for positive blood cultures.  Patient sustained a small cut on her right fifth toe back in May.  She was seen in urgent care on June 20 because the site began to have more erythema edema and pain.  Was started on Bactrim  as an outpatient for cellulitis coverage and placed in a postop shoe to avoid additional injury.  She followed up with a podiatrist 3 days later on the 23rd because the antibiotics were not helping.  At that time she was noted to have small area of fluctuance consistent with abscess.  Abscess was incised and drained in the office.  She was continued on Bactrim  and Augmentin  was added.  Wound cultures from that visit were positive for MRSA resistant to Bactrim .  No fevers chills currently   Abnormal Lab      Prior to Admission medications   Medication Sig Start Date End Date Taking? Authorizing Provider  HYDROcodone -acetaminophen  (NORCO/VICODIN) 5-325 MG tablet Take 1-2 tablets by mouth every 6 (six) hours as needed. 02/11/24  Yes Gershon Donnice SAUNDERS, DPM  naproxen  (NAPROSYN ) 375 MG tablet Take 1 tablet (375 mg total) by mouth 2 (two) times daily. 02/08/24  Yes Johnie, Carley A, NP  clindamycin  (CLEOCIN ) 300 MG capsule Take 1 capsule (300 mg total) by mouth 4 (four) times daily for 7 days. 02/17/24 02/24/24  Pamella Ozell LABOR, DO  erythromycin  ophthalmic ointment Place a 1/2 inch ribbon of ointment into the lower eyelid every 12 hours for the next 7 days. Patient not taking: Reported on 02/17/2024 01/30/24   Enedelia Dorna HERO, FNP    Allergies: Augmentin  [amoxicillin -pot clavulanate]    Review of Systems  Updated Vital Signs BP 118/76   Pulse 76   Temp 99.2 F (37.3 C) (Oral)   Resp 14   Ht 5' 7 (1.702 m)   Wt 85.3 kg   LMP  01/23/2024 (Exact Date)   SpO2 100%   BMI 29.44 kg/m   Physical Exam Vitals and nursing note reviewed.  HENT:     Head: Normocephalic and atraumatic.   Eyes:     Pupils: Pupils are equal, round, and reactive to light.    Cardiovascular:     Rate and Rhythm: Normal rate and regular rhythm.  Pulmonary:     Effort: Pulmonary effort is normal.     Breath sounds: Normal breath sounds.  Abdominal:     Palpations: Abdomen is soft.     Tenderness: There is no abdominal tenderness.   Skin:    General: Skin is warm and dry.     Comments: Open wound over plantar aspect of right fifth toe with no purulent drainage or surrounding erythema Sensation tact light touch throughout right foot 5 and 5 motor strength on right foot No streaking redness   Neurological:     Mental Status: She is alert.   Psychiatric:        Mood and Affect: Mood normal.     (all labs ordered are listed, but only abnormal results are displayed) Labs Reviewed  CBC WITH DIFFERENTIAL/PLATELET - Abnormal; Notable for the following components:      Result Value   WBC 3.0 (*)    Neutro Abs 1.0 (*)    All  other components within normal limits  COMPREHENSIVE METABOLIC PANEL WITH GFR - Abnormal; Notable for the following components:   Glucose, Bld 100 (*)    All other components within normal limits  CULTURE, BLOOD (ROUTINE X 2)  CULTURE, BLOOD (ROUTINE X 2)  I-STAT CG4 LACTIC ACID, ED  I-STAT CG4 LACTIC ACID, ED    EKG: None  Radiology: No results found.   Procedures   Medications Ordered in the ED  vancomycin (VANCOREADY) IVPB 2000 mg/400 mL (2,000 mg Intravenous New Bag/Given 02/17/24 1715)  diphenhydrAMINE  (BENADRYL ) injection 25 mg (25 mg Intravenous Given 02/17/24 1851)    Clinical Course as of 02/17/24 1922  Sun Feb 17, 2024  1918 No leukocytosis or elevation of venous lactate.  Low suspicion for sepsis at this time.  Patient was able to finish the Vanco but did have some redness and itching.   Resolved with Benadryl .  Will discharge on clindamycin  and instruct for PCP/podiatry follow-up for reevaluation of wound [MP]    Clinical Course User Index [MP] Pamella Ozell LABOR, DO                                 Medical Decision Making 33 year old female with history as above presenting given concern for right foot infection.  Wound culture was positive for MRSA resistant to Bactrim  which she has been taking.  She is also been on Augmentin .  Sensitive to clindamycin .  No systemic signs of illness.  X-rays from a couple days ago show no osteomyelitis or bone involvement.  Will obtain laboratory workup including blood cultures here give her dose of vancomycin and discharged on clindamycin  if lab work is reassuring  Risk Prescription drug management.        Final diagnoses:  Infection of wound due to methicillin resistant Staphylococcus aureus (MRSA)    ED Discharge Orders          Ordered    clindamycin  (CLEOCIN ) 300 MG capsule  2 times daily,   Status:  Discontinued        02/17/24 1920    clindamycin  (CLEOCIN ) 300 MG capsule  4 times daily        02/17/24 1921               Pamella Ozell LABOR, DO 02/17/24 1922

## 2024-02-17 NOTE — ED Provider Triage Note (Addendum)
 Emergency Medicine Provider Triage Evaluation Note  Jeanne Chaney , a 33 y.o. female  was evaluated in triage.  Pt complains of a postive set of blood cultures this morning.  Reports she was evaluated here recently for right toe wound that has been followed by Dr. Alona, states that she mom saw the chart and noted that she was positive for MRSA therefore she came into the hospital.  Not having any systemic signs.  She is currently on Bactrim  along with Alimentum, does endorse some vaginal itching, no prior history of yeast due to antibiotics.  No other systemic signs  Review of Systems  Positive: Wound, vaginal itching Negative: Fever, chills,   Physical Exam  LMP 01/23/2024 (Exact Date)  Gen:   Awake, no distress   Resp:  Normal effort  MSK:   Moves extremities without difficulty  Other:    Medical Decision Making  Medically screening exam initiated at 2:46 PM.  Appropriate orders placed.  Jeanne Chaney was informed that the remainder of the evaluation will be completed by another provider, this initial triage assessment does not replace that evaluation, and the importance of remaining in the ED until their evaluation is complete.     Partick Musselman, PA-C 02/17/24 1449    Sarim Rothman, PA-C 02/17/24 1526

## 2024-02-18 NOTE — Progress Notes (Signed)
 Subjective:   Patient ID: Jeanne Chaney, female   DOB: 33 y.o.   MRN: [id number]   HPI Chief Complaint  Patient presents with   Wound Check    RM#13 Follow up right foot wound check pinky area patient still in a lot of pain minor discharge.     33 year old female presents the office today for follow-up evaluation of abscess, wound on the right foot.  She states he still having pain but overall doing better.  She still within the surgical shoe.  Does not report any fevers or chills.  She is still taking the Bactrim .  Augmentin  was causing some PACs.    Objective:  Physical Exam   There were no vitals filed for this visit.   General: AAO x3, NAD  Dermatological: Plantar aspect of right foot just plantar to the fifth MPJ where she had the abscess this is significantly proved.  There is still an open wound present there is no purulence.  Consistent local edema and area improved.  There is no ascending cellulitis there is no fluctuation with today. Vascular: Dorsalis Pedis artery and Posterior Tibial artery pedal pulses are 2/4 bilateral with immedate capillary fill time.  There is no pain with calf compression, swelling, warmth, erythema.   Neruologic: Grossly intact via light touch bilateral.   Musculoskeletal: Tenderness right foot along the area the abscess      Assessment:   Abscess right foot with cellulitis     Plan:  -Treatment options discussed including all alternatives, risks, and complications -Etiology of symptoms were discussed - Overall the wound seems to be doing better.  Continue with dressing changes daily.  A small amount of Betadine applied today followed by dressing.  There are some macerated tissue along the wound plantarly.  Overall infection seems to be improving.  Cultures growing staph with sensitivities not back yet.  Will continue Bactrim  for now. - Continue surgical shoe, elevation -Monitor for any clinical signs or symptoms of infection and directed  to call the office immediately should any occur or go to the ER.  No follow-ups on file.  Jeanne Chaney DPM

## 2024-02-19 ENCOUNTER — Ambulatory Visit (INDEPENDENT_AMBULATORY_CARE_PROVIDER_SITE_OTHER): Payer: MEDICAID | Admitting: Podiatry

## 2024-02-19 DIAGNOSIS — L02611 Cutaneous abscess of right foot: Secondary | ICD-10-CM

## 2024-02-21 ENCOUNTER — Ambulatory Visit
Admission: RE | Admit: 2024-02-21 | Discharge: 2024-02-21 | Disposition: A | Discharge status: Home / Self Care | Payer: MEDICAID | Source: Ambulatory Visit | Attending: Podiatry | Admitting: Podiatry

## 2024-02-21 DIAGNOSIS — L02611 Cutaneous abscess of right foot: Secondary | ICD-10-CM

## 2024-02-21 MED ORDER — GADOPICLENOL 0.5 MMOL/ML IV SOLN
8.0000 mL | Freq: Once | INTRAVENOUS | Status: AC | PRN
  Administered 2024-02-21: 8 mL via INTRAVENOUS

## 2024-02-21 NOTE — Progress Notes (Signed)
 Subjective:   Patient ID: Jeanne Chaney, female   DOB: 33 y.o.   MRN: [id number]   HPI Chief Complaint  Patient presents with   Recurrent Skin Infections    RM#12 Follow up on right foot abscess pinky toe area on different antibiotics which is tolerating well.    33 year old female presents the office today for follow-up evaluation of abscess, wound on the right foot.  She went to the emergency room over the weekend she was switched to clindamycin .  She said the foot has been doing well he is to go back to work tomorrow.  Pain is much improved.  She does not report any fevers or chills.  No drainage.      Objective:  Physical Exam   There were no vitals filed for this visit.    General: AAO x3, NAD  Dermatological: Plantar aspect of right foot just plantar to the fifth MPJ where she had the abscess this is significantly improved.  There are still 1 superficial area of skin breakdown.  There is no drainage or pus.  The edema and erythema is much improved as well.  There is no fluctuation or crepitation.  There is no malodor.  There is no significant discomfort on exam today. There is no pain with calf compression, swelling, warmth, erythema.        Neruologic: Grossly intact via light touch bilateral.   Musculoskeletal: Tenderness right foot along the area the abscess      Assessment:   Abscess right foot with cellulitis     Plan:  -Treatment options discussed including all alternatives, risks, and complications -Etiology of symptoms were discussed -Overall doing much better.  Finish the course of clindamycin  is left-sided effects.  Discussed daily dressing and discontinue voiding.  She is back to her regular shoe.  She has an MRI scheduled Thursday and I still recommend getting this to rule out any other underlying pathology. -Monitor for any clinical signs or symptoms of infection and directed to call the office immediately should any occur or go to the ER.  Return in  about 2 weeks (around 03/04/2024).  [name] DPM

## 2024-02-22 LAB — CULTURE, BLOOD (ROUTINE X 2)
Culture: NO GROWTH
Culture: NO GROWTH
Special Requests: ADEQUATE

## 2024-03-07 ENCOUNTER — Ambulatory Visit: Payer: MEDICAID | Admitting: Podiatry

## 2024-04-03 ENCOUNTER — Encounter (HOSPITAL_COMMUNITY): Payer: Self-pay
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
## 2024-04-04 ENCOUNTER — Encounter (HOSPITAL_COMMUNITY): Payer: Self-pay

## 2024-04-04 ENCOUNTER — Ambulatory Visit (HOSPITAL_COMMUNITY)
Admission: EM | Admit: 2024-04-04 | Discharge: 2024-04-04 | Disposition: A | Discharge status: Home / Self Care | Payer: MEDICAID | Attending: Emergency Medicine | Admitting: Emergency Medicine

## 2024-04-04 ENCOUNTER — Ambulatory Visit (INDEPENDENT_AMBULATORY_CARE_PROVIDER_SITE_OTHER): Payer: MEDICAID

## 2024-04-04 ENCOUNTER — Other Ambulatory Visit (HOSPITAL_COMMUNITY): Payer: Self-pay

## 2024-04-04 DIAGNOSIS — S46811A Strain of other muscles, fascia and tendons at shoulder and upper arm level, right arm, initial encounter: Secondary | ICD-10-CM | POA: Diagnosis not present

## 2024-04-04 DIAGNOSIS — S46911A Strain of unspecified muscle, fascia and tendon at shoulder and upper arm level, right arm, initial encounter: Secondary | ICD-10-CM | POA: Diagnosis not present

## 2024-04-04 DIAGNOSIS — M25511 Pain in right shoulder: Secondary | ICD-10-CM

## 2024-04-04 MED ORDER — IBUPROFEN 600 MG PO TABS
600.0000 mg | ORAL_TABLET | Freq: Four times a day (QID) | ORAL | 0 refills | Status: DC | PRN
  Filled 2024-04-04: qty 30, 8d supply, fill #0

## 2024-04-04 NOTE — Discharge Instructions (Addendum)
 Your x-ray is negative for any underlying fracture or acute dislocation. It is likely that you have strained your shoulder muscle as well as your trapezius muscle in your upper back. You can continue taking Flexeril  up to twice daily as needed for shoulder pain.  This can make you drowsy so do not drive, work, or drink alcohol while taking this. Otherwise alternate between 600 mg of ibuprofen  and 500 mg of Tylenol  every 6-8 hours as needed for additional pain relief. Continue to do some gentle stretching, and alternate between ice and heat as needed for pain. Follow-up with [facility] if your pain continues for further evaluation and management. Otherwise you can follow-up with your primary care provider or return here as needed.
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
## 2024-04-15 ENCOUNTER — Other Ambulatory Visit (HOSPITAL_COMMUNITY): Payer: Self-pay

## 2024-06-19 ENCOUNTER — Ambulatory Visit (INDEPENDENT_AMBULATORY_CARE_PROVIDER_SITE_OTHER): Payer: MEDICAID | Admitting: Primary Care

## 2024-06-23 ENCOUNTER — Encounter: Payer: Self-pay | Admitting: Radiology

## 2024-07-23 ENCOUNTER — Emergency Department (HOSPITAL_COMMUNITY)

## 2024-07-23 ENCOUNTER — Encounter (HOSPITAL_COMMUNITY): Payer: Self-pay

## 2024-07-23 ENCOUNTER — Emergency Department (HOSPITAL_COMMUNITY)
Admission: EM | Admit: 2024-07-23 | Discharge: 2024-07-23 | Disposition: A | Discharge status: Home / Self Care | Attending: Emergency Medicine | Admitting: Emergency Medicine

## 2024-07-23 ENCOUNTER — Other Ambulatory Visit: Payer: Self-pay

## 2024-07-23 DIAGNOSIS — R519 Headache, unspecified: Secondary | ICD-10-CM | POA: Diagnosis present

## 2024-07-23 DIAGNOSIS — Y92410 Unspecified street and highway as the place of occurrence of the external cause: Secondary | ICD-10-CM | POA: Insufficient documentation

## 2024-07-23 DIAGNOSIS — M25561 Pain in right knee: Secondary | ICD-10-CM | POA: Insufficient documentation

## 2024-07-23 DIAGNOSIS — S0083XA Contusion of other part of head, initial encounter: Secondary | ICD-10-CM | POA: Insufficient documentation

## 2024-07-23 LAB — PREGNANCY, URINE: Preg Test, Ur: NEGATIVE

## 2024-07-23 MED ORDER — METHOCARBAMOL 500 MG PO TABS: 500.0000 mg | ORAL_TABLET | Freq: Two times a day (BID) | ORAL | 0 refills | Status: AC

## 2024-07-23 MED ORDER — IBUPROFEN 600 MG PO TABS: 600.0000 mg | ORAL_TABLET | Freq: Four times a day (QID) | ORAL | 0 refills | Status: DC | PRN
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
## 2024-07-30 ENCOUNTER — Ambulatory Visit: Payer: Self-pay
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
## 2024-07-31 ENCOUNTER — Encounter: Payer: Self-pay | Admitting: Family Medicine

## 2024-07-31 ENCOUNTER — Encounter: Payer: MEDICAID | Admitting: Family

## 2024-07-31 ENCOUNTER — Ambulatory Visit: Payer: MEDICAID | Admitting: Family Medicine

## 2024-07-31 VITALS — BP 118/74 | HR 75 | Ht 67.0 in | Wt 178.6 lb

## 2024-07-31 DIAGNOSIS — F0781 Postconcussional syndrome: Secondary | ICD-10-CM

## 2024-07-31 DIAGNOSIS — R519 Headache, unspecified: Secondary | ICD-10-CM

## 2024-07-31 MED ORDER — PROMETHAZINE HCL 25 MG PO TABS: 25.0000 mg | ORAL_TABLET | Freq: Three times a day (TID) | ORAL | 0 refills | Status: AC | PRN

## 2024-07-31 NOTE — Progress Notes (Signed)
 Erroneous encounter-disregard

## 2024-08-04 ENCOUNTER — Encounter: Payer: Self-pay | Admitting: Family Medicine
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
## 2024-08-07 ENCOUNTER — Encounter (INDEPENDENT_AMBULATORY_CARE_PROVIDER_SITE_OTHER): Payer: Self-pay | Admitting: Primary Care

## 2024-08-07 ENCOUNTER — Ambulatory Visit (INDEPENDENT_AMBULATORY_CARE_PROVIDER_SITE_OTHER): Payer: MEDICAID | Admitting: Primary Care

## 2024-08-07 VITALS — BP 111/77 | HR 89 | Resp 16 | Wt 175.4 lb

## 2024-08-07 DIAGNOSIS — G44319 Acute post-traumatic headache, not intractable: Secondary | ICD-10-CM

## 2024-08-07 DIAGNOSIS — G43709 Chronic migraine without aura, not intractable, without status migrainosus: Secondary | ICD-10-CM

## 2024-08-07 MED ORDER — PREDNISONE 10 MG (21) PO TBPK: ORAL_TABLET | ORAL | 0 refills | Status: AC

## 2024-08-07 MED ORDER — KETOROLAC TROMETHAMINE 60 MG/2ML IM SOLN
60.0000 mg | Freq: Once | INTRAMUSCULAR | Status: AC
  Administered 2024-08-07: 60 mg via INTRAMUSCULAR
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
## 2024-08-11 ENCOUNTER — Encounter (INDEPENDENT_AMBULATORY_CARE_PROVIDER_SITE_OTHER): Payer: Self-pay

## 2024-08-19 ENCOUNTER — Encounter: Payer: Self-pay | Admitting: Neurology

## 2024-08-19 ENCOUNTER — Ambulatory Visit: Payer: Self-pay

## 2024-08-19 ENCOUNTER — Ambulatory Visit (INDEPENDENT_AMBULATORY_CARE_PROVIDER_SITE_OTHER): Payer: MEDICAID | Admitting: Neurology

## 2024-08-19 VITALS — BP 107/71 | HR 86 | Ht 67.0 in | Wt 177.5 lb

## 2024-08-19 DIAGNOSIS — G43701 Chronic migraine without aura, not intractable, with status migrainosus: Secondary | ICD-10-CM | POA: Insufficient documentation

## 2024-08-19 DIAGNOSIS — R519 Headache, unspecified: Secondary | ICD-10-CM | POA: Insufficient documentation

## 2024-08-19 MED ORDER — SUMATRIPTAN SUCCINATE 100 MG PO TABS: 100.0000 mg | ORAL_TABLET | Freq: Once | ORAL | 6 refills | Status: AC | PRN

## 2024-08-19 MED ORDER — NORTRIPTYLINE HCL 10 MG PO CAPS: 20.0000 mg | ORAL_CAPSULE | Freq: Every day | ORAL | 11 refills | Status: AC

## 2024-08-19 MED ORDER — ONDANSETRON 4 MG PO TBDP: 4.0000 mg | ORAL_TABLET | Freq: Three times a day (TID) | ORAL | 6 refills | Status: AC | PRN
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
## 2024-08-19 NOTE — Patient Instructions (Signed)
 Meds ordered this encounter  Medications   nortriptyline  (PAMELOR ) 10 MG capsule    Sig: Take 2 capsules (20 mg total) by mouth at bedtime.    Dispense:  60 capsule    Refill:  11   ondansetron  (ZOFRAN -ODT) 4 MG disintegrating tablet    Sig: Take 1 tablet (4 mg total) by mouth every 8 (eight) hours as needed.    Dispense:  20 tablet    Refill:  6   SUMAtriptan  (IMITREX ) 100 MG tablet    Sig: Take 1 tablet (100 mg total) by mouth once as needed for migraine. May repeat in 2 hours if headache persists or recurs.    Dispense:  12 tablet    Refill:  6    Do not refill in less than 30 days

## 2024-08-19 NOTE — Telephone Encounter (Signed)
 Pt hs an appt with Neurology today at 2:30  Reached out to pt and made aware to keep that appointment today. Pt states she understands and doesn't have any questions or concerns
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
## 2024-08-26 ENCOUNTER — Telehealth: Payer: Self-pay | Admitting: Neurology

## 2024-08-26 NOTE — Telephone Encounter (Signed)
 Jarrell barrows: [id number] exp. 08/26/24-10/25/24 scheduled at [facility] on 1/12.

## 2024-09-01 ENCOUNTER — Other Ambulatory Visit: Payer: MEDICAID

## 2024-10-07 ENCOUNTER — Ambulatory Visit: Admitting: Diagnostic Neuroimaging
